# Patient Record
Sex: Male | Born: 1940
Health system: Southern US, Community
[De-identification: ages and names within clinical notes are randomized; demographics above are authoritative.]

## PROBLEM LIST (undated history)

## (undated) DIAGNOSIS — G8929 Other chronic pain: Secondary | ICD-10-CM

## (undated) DIAGNOSIS — M1990 Unspecified osteoarthritis, unspecified site: Secondary | ICD-10-CM

## (undated) DIAGNOSIS — D649 Anemia, unspecified: Secondary | ICD-10-CM

## (undated) DIAGNOSIS — R0609 Other forms of dyspnea: Secondary | ICD-10-CM

## (undated) DIAGNOSIS — E785 Hyperlipidemia, unspecified: Secondary | ICD-10-CM

## (undated) DIAGNOSIS — F1120 Opioid dependence, uncomplicated: Secondary | ICD-10-CM

## (undated) DIAGNOSIS — Z9889 Other specified postprocedural states: Secondary | ICD-10-CM

## (undated) DIAGNOSIS — Z87442 Personal history of urinary calculi: Secondary | ICD-10-CM

## (undated) DIAGNOSIS — I119 Hypertensive heart disease without heart failure: Secondary | ICD-10-CM

## (undated) DIAGNOSIS — E038 Other specified hypothyroidism: Secondary | ICD-10-CM

## (undated) DIAGNOSIS — R001 Bradycardia, unspecified: Secondary | ICD-10-CM

## (undated) DIAGNOSIS — H04123 Dry eye syndrome of bilateral lacrimal glands: Secondary | ICD-10-CM

## (undated) DIAGNOSIS — C9590 Leukemia, unspecified not having achieved remission: Secondary | ICD-10-CM

## (undated) DIAGNOSIS — F419 Anxiety disorder, unspecified: Secondary | ICD-10-CM

## (undated) DIAGNOSIS — I2510 Atherosclerotic heart disease of native coronary artery without angina pectoris: Secondary | ICD-10-CM

## (undated) DIAGNOSIS — C9110 Chronic lymphocytic leukemia of B-cell type not having achieved remission: Secondary | ICD-10-CM

## (undated) DIAGNOSIS — T8090XA Unspecified complication following infusion and therapeutic injection, initial encounter: Secondary | ICD-10-CM

## (undated) DIAGNOSIS — K5900 Constipation, unspecified: Secondary | ICD-10-CM

## (undated) DIAGNOSIS — D6959 Other secondary thrombocytopenia: Secondary | ICD-10-CM

## (undated) DIAGNOSIS — F320 Major depressive disorder, single episode, mild: Secondary | ICD-10-CM

## (undated) DIAGNOSIS — M549 Dorsalgia, unspecified: Secondary | ICD-10-CM

## (undated) DIAGNOSIS — R079 Chest pain, unspecified: Secondary | ICD-10-CM

## (undated) DIAGNOSIS — M792 Neuralgia and neuritis, unspecified: Secondary | ICD-10-CM

## (undated) DIAGNOSIS — N200 Calculus of kidney: Secondary | ICD-10-CM

## (undated) DIAGNOSIS — N400 Enlarged prostate without lower urinary tract symptoms: Secondary | ICD-10-CM

## (undated) DIAGNOSIS — R351 Nocturia: Secondary | ICD-10-CM

## (undated) DIAGNOSIS — R531 Weakness: Secondary | ICD-10-CM

## (undated) DIAGNOSIS — G894 Chronic pain syndrome: Secondary | ICD-10-CM

## (undated) DIAGNOSIS — M5416 Radiculopathy, lumbar region: Secondary | ICD-10-CM

## (undated) DIAGNOSIS — Z9289 Personal history of other medical treatment: Secondary | ICD-10-CM

## (undated) DIAGNOSIS — A088 Other specified intestinal infections: Secondary | ICD-10-CM

## (undated) DIAGNOSIS — G4700 Insomnia, unspecified: Secondary | ICD-10-CM

## (undated) DIAGNOSIS — D693 Immune thrombocytopenic purpura: Secondary | ICD-10-CM

## (undated) DIAGNOSIS — M62838 Other muscle spasm: Secondary | ICD-10-CM

## (undated) DIAGNOSIS — I10 Essential (primary) hypertension: Secondary | ICD-10-CM

## (undated) DIAGNOSIS — E538 Deficiency of other specified B group vitamins: Secondary | ICD-10-CM

## (undated) DIAGNOSIS — M545 Low back pain: Secondary | ICD-10-CM

## (undated) DIAGNOSIS — D6949 Other primary thrombocytopenia: Secondary | ICD-10-CM

## (undated) HISTORY — DX: Opioid dependence, uncomplicated: F11.20

## (undated) HISTORY — PX: FOOT SURGERY: SHX648

## (undated) HISTORY — DX: Radiculopathy, lumbar region: M54.16

## (undated) HISTORY — DX: Chronic lymphocytic leukemia of B-cell type not having achieved remission: C91.10

## (undated) HISTORY — DX: Other specified postprocedural states: Z98.890

## (undated) HISTORY — PX: COLONOSCOPY: SHX174

## (undated) HISTORY — DX: Essential (primary) hypertension: I10

## (undated) HISTORY — DX: Low back pain: M54.5

## (undated) HISTORY — PX: KNEE SURGERY: SHX244

## (undated) HISTORY — DX: Calculus of kidney: N20.0

## (undated) HISTORY — PX: LAMINECTOMY: SHX219

## (undated) HISTORY — DX: Other forms of dyspnea: R06.09

## (undated) HISTORY — PX: TOE SURGERY: SHX1073

## (undated) HISTORY — DX: Benign prostatic hyperplasia without lower urinary tract symptoms: N40.0

## (undated) HISTORY — DX: Other specified hypothyroidism: E03.8

## (undated) HISTORY — DX: Bradycardia, unspecified: R00.1

## (undated) HISTORY — DX: Hypertensive heart disease without heart failure: I11.9

## (undated) HISTORY — PX: HERNIA REPAIR: SHX51

## (undated) HISTORY — DX: Immune thrombocytopenic purpura: D69.3

## (undated) HISTORY — DX: Chronic pain syndrome: G89.4

## (undated) HISTORY — DX: Other secondary thrombocytopenia: D69.59

## (undated) HISTORY — DX: Hyperlipidemia, unspecified: E78.5

## (undated) HISTORY — DX: Major depressive disorder, single episode, mild: F32.0

## (undated) HISTORY — DX: Chest pain, unspecified: R07.9

## (undated) HISTORY — PX: TRANSURETHRAL RESECTION OF PROSTATE: SHX73

## (undated) HISTORY — DX: Other primary thrombocytopenia: D69.49

## (undated) HISTORY — DX: Atherosclerotic heart disease of native coronary artery without angina pectoris: I25.10

## (undated) HISTORY — DX: Other specified intestinal infections: A08.8

## (undated) HISTORY — PX: CATARACT EXTRACTION: SUR2

---

## 1986-09-19 HISTORY — PX: CERVICAL FUSION: SHX112

## 2005-10-17 ENCOUNTER — Ambulatory Visit: Payer: Self-pay | Admitting: Oncology

## 2007-04-26 ENCOUNTER — Encounter: Admission: RE | Admit: 2007-04-26 | Discharge: 2007-04-26 | Payer: Self-pay | Admitting: Orthopedic Surgery

## 2007-05-08 ENCOUNTER — Encounter: Admission: RE | Admit: 2007-05-08 | Discharge: 2007-05-08 | Payer: Self-pay | Admitting: Orthopedic Surgery

## 2008-08-01 ENCOUNTER — Encounter: Payer: Self-pay | Admitting: Unknown Physician Specialty

## 2010-09-19 HISTORY — PX: BACK SURGERY: SHX140
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
## 2012-08-14 ENCOUNTER — Other Ambulatory Visit (HOSPITAL_COMMUNITY)
Admission: RE | Admit: 2012-08-14 | Discharge: 2012-08-14 | Disposition: A | Discharge status: Home / Self Care | Payer: Medicare Other | Source: Ambulatory Visit | Attending: Oncology | Admitting: Oncology

## 2012-08-14 DIAGNOSIS — D469 Myelodysplastic syndrome, unspecified: Secondary | ICD-10-CM | POA: Insufficient documentation

## 2012-12-12 DIAGNOSIS — M545 Low back pain, unspecified: Secondary | ICD-10-CM

## 2012-12-12 HISTORY — DX: Low back pain, unspecified: M54.50

## 2013-01-09 DIAGNOSIS — M5416 Radiculopathy, lumbar region: Secondary | ICD-10-CM

## 2013-01-09 HISTORY — DX: Radiculopathy, lumbar region: M54.16

## 2013-07-02 ENCOUNTER — Other Ambulatory Visit: Payer: Self-pay | Admitting: Neurosurgery

## 2013-07-02 DIAGNOSIS — M549 Dorsalgia, unspecified: Secondary | ICD-10-CM

## 2013-08-08 DIAGNOSIS — D693 Immune thrombocytopenic purpura: Secondary | ICD-10-CM

## 2013-08-08 DIAGNOSIS — C9110 Chronic lymphocytic leukemia of B-cell type not having achieved remission: Secondary | ICD-10-CM | POA: Insufficient documentation

## 2013-08-08 HISTORY — DX: Immune thrombocytopenic purpura: D69.3

## 2013-08-09 ENCOUNTER — Other Ambulatory Visit: Payer: Medicare Other

## 2013-08-09 ENCOUNTER — Inpatient Hospital Stay
Admission: RE | Admit: 2013-08-09 | Discharge: 2013-08-09 | Disposition: A | Discharge status: Home / Self Care | Payer: Medicare Other | Source: Ambulatory Visit | Attending: Neurosurgery | Admitting: Neurosurgery

## 2013-09-19 HISTORY — PX: OTHER SURGICAL HISTORY: SHX169

## 2014-10-08 LAB — HM COLONOSCOPY

## 2015-08-18 ENCOUNTER — Other Ambulatory Visit: Payer: Self-pay | Admitting: Neurological Surgery

## 2015-08-18 DIAGNOSIS — M545 Low back pain: Secondary | ICD-10-CM

## 2015-08-20 ENCOUNTER — Ambulatory Visit
Admission: RE | Admit: 2015-08-20 | Discharge: 2015-08-20 | Disposition: A | Discharge status: Home / Self Care | Payer: Medicare Other | Source: Ambulatory Visit | Attending: Neurological Surgery | Admitting: Neurological Surgery

## 2015-08-20 DIAGNOSIS — M545 Low back pain: Secondary | ICD-10-CM

## 2015-11-10 ENCOUNTER — Other Ambulatory Visit: Payer: Self-pay | Admitting: Neurological Surgery

## 2015-11-25 ENCOUNTER — Encounter (HOSPITAL_COMMUNITY)
Admission: RE | Admit: 2015-11-25 | Discharge: 2015-11-25 | Disposition: A | Discharge status: Home / Self Care | Payer: Medicare Other | Source: Ambulatory Visit | Attending: Neurological Surgery | Admitting: Neurological Surgery

## 2015-11-25 ENCOUNTER — Ambulatory Visit (HOSPITAL_COMMUNITY)
Admission: RE | Admit: 2015-11-25 | Discharge: 2015-11-25 | Disposition: A | Discharge status: Home / Self Care | Payer: Medicare Other | Source: Ambulatory Visit | Attending: Neurological Surgery | Admitting: Neurological Surgery

## 2015-11-25 ENCOUNTER — Encounter (HOSPITAL_COMMUNITY): Payer: Self-pay

## 2015-11-25 DIAGNOSIS — Z01818 Encounter for other preprocedural examination: Secondary | ICD-10-CM | POA: Diagnosis present

## 2015-11-25 DIAGNOSIS — D649 Anemia, unspecified: Secondary | ICD-10-CM | POA: Insufficient documentation

## 2015-11-25 DIAGNOSIS — C9110 Chronic lymphocytic leukemia of B-cell type not having achieved remission: Secondary | ICD-10-CM | POA: Insufficient documentation

## 2015-11-25 DIAGNOSIS — S32009K Unspecified fracture of unspecified lumbar vertebra, subsequent encounter for fracture with nonunion: Secondary | ICD-10-CM | POA: Diagnosis not present

## 2015-11-25 DIAGNOSIS — E538 Deficiency of other specified B group vitamins: Secondary | ICD-10-CM | POA: Diagnosis not present

## 2015-11-25 DIAGNOSIS — D693 Immune thrombocytopenic purpura: Secondary | ICD-10-CM | POA: Insufficient documentation

## 2015-11-25 DIAGNOSIS — X58XXXD Exposure to other specified factors, subsequent encounter: Secondary | ICD-10-CM | POA: Diagnosis not present

## 2015-11-25 DIAGNOSIS — E785 Hyperlipidemia, unspecified: Secondary | ICD-10-CM | POA: Diagnosis not present

## 2015-11-25 HISTORY — DX: Unspecified complication following infusion and therapeutic injection, initial encounter: T80.90XA

## 2015-11-25 HISTORY — DX: Other chronic pain: G89.29

## 2015-11-25 HISTORY — DX: Dry eye syndrome of bilateral lacrimal glands: H04.123

## 2015-11-25 HISTORY — DX: Leukemia, unspecified not having achieved remission: C95.90

## 2015-11-25 HISTORY — DX: Personal history of urinary calculi: Z87.442

## 2015-11-25 HISTORY — DX: Nocturia: R35.1

## 2015-11-25 HISTORY — DX: Other muscle spasm: M62.838

## 2015-11-25 HISTORY — DX: Hyperlipidemia, unspecified: E78.5

## 2015-11-25 HISTORY — DX: Unspecified osteoarthritis, unspecified site: M19.90

## 2015-11-25 HISTORY — DX: Constipation, unspecified: K59.00

## 2015-11-25 HISTORY — DX: Anemia, unspecified: D64.9

## 2015-11-25 HISTORY — DX: Weakness: R53.1

## 2015-11-25 HISTORY — DX: Neuralgia and neuritis, unspecified: M79.2

## 2015-11-25 HISTORY — DX: Dorsalgia, unspecified: M54.9

## 2015-11-25 HISTORY — DX: Insomnia, unspecified: G47.00

## 2015-11-25 HISTORY — DX: Anxiety disorder, unspecified: F41.9

## 2015-11-25 HISTORY — DX: Deficiency of other specified B group vitamins: E53.8

## 2015-11-25 HISTORY — DX: Personal history of other medical treatment: Z92.89

## 2015-11-25 LAB — BASIC METABOLIC PANEL
ANION GAP: 8 (ref 5–15)
BUN: 13 mg/dL (ref 6–20)
CO2: 27 mmol/L (ref 22–32)
Calcium: 9.3 mg/dL (ref 8.9–10.3)
Chloride: 106 mmol/L (ref 101–111)
Creatinine, Ser: 1.02 mg/dL (ref 0.61–1.24)
Glucose, Bld: 97 mg/dL (ref 65–99)
Potassium: 4.6 mmol/L (ref 3.5–5.1)
Sodium: 141 mmol/L (ref 135–145)

## 2015-11-25 LAB — TYPE AND SCREEN
ABO/RH(D): O POS
Antibody Screen: NEGATIVE

## 2015-11-25 LAB — CBC WITH DIFFERENTIAL/PLATELET
BASOS ABS: 0 10*3/uL (ref 0.0–0.1)
Basophils Relative: 0 %
EOS PCT: 3 %
Eosinophils Absolute: 0.2 10*3/uL (ref 0.0–0.7)
HEMATOCRIT: 41.2 % (ref 39.0–52.0)
Hemoglobin: 14 g/dL (ref 13.0–17.0)
LYMPHS ABS: 1 10*3/uL (ref 0.7–4.0)
LYMPHS PCT: 17 %
MCH: 30.2 pg (ref 26.0–34.0)
MCHC: 34 g/dL (ref 30.0–36.0)
MCV: 88.8 fL (ref 78.0–100.0)
MONO ABS: 0.4 10*3/uL (ref 0.1–1.0)
MONOS PCT: 7 %
NEUTROS ABS: 4.3 10*3/uL (ref 1.7–7.7)
Neutrophils Relative %: 73 %
Platelets: 265 10*3/uL (ref 150–400)
RBC: 4.64 MIL/uL (ref 4.22–5.81)
RDW: 13.5 % (ref 11.5–15.5)
WBC: 5.9 10*3/uL (ref 4.0–10.5)

## 2015-11-25 LAB — SURGICAL PCR SCREEN
MRSA, PCR: NEGATIVE
STAPHYLOCOCCUS AUREUS: NEGATIVE

## 2015-11-25 LAB — ABO/RH: ABO/RH(D): O POS

## 2015-11-25 LAB — PROTIME-INR
INR: 1.16 (ref 0.00–1.49)
Prothrombin Time: 15 seconds (ref 11.6–15.2)

## 2015-11-25 NOTE — Progress Notes (Addendum)
Cardiologist is Dr.[facility] visit under care everywhere from 07-02-15  Medical Md [facility]   Multiple echo reports in epic with most recent 07-10-15  Stress test requested from [facility] in [place] cath  EKG req from [facility] in [place] denies in past yr

## 2015-11-25 NOTE — Pre-Procedure Instructions (Signed)
Jesse [name]  11/25/2015      [facility] - [place], [place] - [address] [address] Phone: [phone] Fax: [phone]    Your procedure is scheduled on Wed,Mar 15 @ 8:30 AM  Report to [facility] Admitting at 6:30 AM  Call this number if you have problems the morning of surgery:  [phone]   Remember:  Do not eat food or drink liquids after midnight.  Take these medicines the morning of surgery with A SIP OF WATER Alprazolam(Xanax),Gabapentin(Neurontin),Pain Pill(if needed),and Eye Drops.              Stop taking your Ibuprofen along with any Vitamins or Herbal Medications. No Goody's,BC's,Aleve,Advil,Motrin,or Fish Oil.    Do not wear jewelry.  Do not wear lotions, powders, or colognes.  You may wear deodorant.  Men may shave face and neck.  Do not bring valuables to the hospital.  [facility] is not responsible for any belongings or valuables.  Contacts, dentures or bridgework may not be worn into surgery.  Leave your suitcase in the car.  After surgery it may be brought to your room.  For patients admitted to the hospital, discharge time will be determined by your treatment team.  Patients discharged the day of surgery will not be allowed to drive home.    Special instructions:  [facility] - Preparing for Surgery  Before surgery, you can play an important role.  Because skin is not sterile, your skin needs to be as free of germs as possible.  You can reduce the number of germs on you skin by washing with CHG (chlorahexidine gluconate) soap before surgery.  CHG is an antiseptic cleaner which kills germs and bonds with the skin to continue killing germs even after washing.  Please DO NOT use if you have an allergy to CHG or antibacterial soaps.  If your skin becomes reddened/irritated stop using the CHG and inform your nurse when you arrive at Short Stay.  Do not shave (including legs and underarms) for at least  48 hours prior to the first CHG shower.  You may shave your face.  Please follow these instructions carefully:   1.  Shower with CHG Soap the night before surgery and the                                morning of Surgery.  2.  If you choose to wash your hair, wash your hair first as usual with your       normal shampoo.  3.  After you shampoo, rinse your hair and body thoroughly to remove the                      Shampoo.  4.  Use CHG as you would any other liquid soap.  You can apply chg directly       to the skin and wash gently with scrungie or a clean washcloth.  5.  Apply the CHG Soap to your body ONLY FROM THE NECK DOWN.        Do not use on open wounds or open sores.  Avoid contact with your eyes,       ears, mouth and genitals (private parts).  Wash genitals (private parts)       with your normal soap.  6.  Wash thoroughly, paying special attention  to the area where your surgery        will be performed.  7.  Thoroughly rinse your body with warm water from the neck down.  8.  DO NOT shower/wash with your normal soap after using and rinsing off       the CHG Soap.  9.  Pat yourself dry with a clean towel.            10.  Wear clean pajamas.            11.  Place clean sheets on your bed the night of your first shower and do not        sleep with pets.  Day of Surgery  Do not apply any lotions/deoderants the morning of surgery.  Please wear clean clothes to the hospital/surgery center.    Please read over the following fact sheets that you were given. Pain Booklet, Coughing and Deep Breathing, Blood Transfusion Information, MRSA Information and Surgical Site Infection Prevention

## 2015-11-26 NOTE — Progress Notes (Signed)
Anesthesia Chart Review:  Pt is a 75 year old male scheduled for L3-4 anterior lasteral lumbar fusion on 12/02/2015 with Dr. Ronnald Ramp.   Cardiologist is Dr. Danie Binder (care everywhere), who cleared pt for surgery. Hem-onc is Dr. Phill Myron (care everywhere), who cleared pt for surgery. PCP is Dr. Rochel Brome.   PMH includes:  Hyperlipidemia, chronic lymphocytic leukemia, chronic ITP,  anemia, vitamin B12 deficiency. Never smoker. BMI 27.   Medications include: iron, pravastatin, vitamin B12.   Preoperative labs reviewed.  CBC is WNL.   Chest x-ray 11/25/15 reviewed. No acute cardiopulmonary disease.   EKG 07/02/15: sinus bradycardia (56 bpm), possible LVH  Echo 07/10/15:  1. Mild concentric LVH with normal contraction, EF 123456, grade I diastolic dysfunction 2. Trace MR, moderate LA dilatation 3. Aortic sclerosis 4. Normal right heart size/function, mild TR, normal pulm artery pressure  If no changes, I anticipate pt can proceed with surgery as scheduled.   Willeen Cass, FNP-BC [facility] Short Stay Surgical Center/Anesthesiology Phone: [phone] 11/26/2015 4:10 PM

## 2015-11-27 ENCOUNTER — Encounter (HOSPITAL_COMMUNITY): Payer: Self-pay

## 2015-12-01 MED ORDER — DEXAMETHASONE SODIUM PHOSPHATE 10 MG/ML IJ SOLN
10.0000 mg | INTRAMUSCULAR | Status: AC
  Administered 2015-12-02: 10 mg via INTRAVENOUS
  Filled 2015-12-01: qty 1

## 2015-12-01 MED ORDER — CEFAZOLIN SODIUM-DEXTROSE 2-3 GM-% IV SOLR
2.0000 g | INTRAVENOUS | Status: AC
  Administered 2015-12-02: 2 g via INTRAVENOUS

## 2015-12-01 NOTE — Anesthesia Preprocedure Evaluation (Signed)
Anesthesia Evaluation  Patient identified by MRN, date of birth, ID band Patient awake    Reviewed: Allergy & Precautions, H&P , Patient's Chart, lab work & pertinent test results, reviewed documented beta blocker date and time   Airway Mallampati: II  TM Distance: >3 FB Neck ROM: full    Dental no notable dental hx.    Pulmonary    Pulmonary exam normal breath sounds clear to auscultation       Cardiovascular  Rhythm:regular Rate:Normal     Neuro/Psych    GI/Hepatic   Endo/Other    Renal/GU      Musculoskeletal   Abdominal   Peds  Hematology   Anesthesia Other Findings   Reproductive/Obstetrics                             Anesthesia Physical Anesthesia Plan  ASA: II  Anesthesia Plan: General   Post-op Pain Management:    Induction: Intravenous  Airway Management Planned: Oral ETT  Additional Equipment:   Intra-op Plan:   Post-operative Plan: Extubation in OR  Informed Consent: I have reviewed the patients History and Physical, chart, labs and discussed the procedure including the risks, benefits and alternatives for the proposed anesthesia with the patient or authorized representative who has indicated his/her understanding and acceptance.   Dental Advisory Given and Dental advisory given  Plan Discussed with: CRNA and Surgeon  Anesthesia Plan Comments: (  Discussed general anesthesia, including possible nausea, instrumentation of airway, sore throat,pulmonary aspiration, etc. I asked if the were any outstanding questions, or  concerns before we proceeded.)        Anesthesia Quick Evaluation  

## 2015-12-02 ENCOUNTER — Inpatient Hospital Stay (HOSPITAL_COMMUNITY): Payer: Medicare Other | Admitting: Anesthesiology

## 2015-12-02 ENCOUNTER — Encounter (HOSPITAL_COMMUNITY): Payer: Self-pay | Admitting: Neurological Surgery

## 2015-12-02 ENCOUNTER — Encounter (HOSPITAL_COMMUNITY)
Admission: RE | Disposition: A | Discharge status: Home / Self Care | Payer: Self-pay | Source: Ambulatory Visit | Attending: Neurological Surgery

## 2015-12-02 ENCOUNTER — Inpatient Hospital Stay (HOSPITAL_COMMUNITY): Payer: Medicare Other | Admitting: Emergency Medicine

## 2015-12-02 ENCOUNTER — Inpatient Hospital Stay (HOSPITAL_COMMUNITY): Payer: Medicare Other

## 2015-12-02 ENCOUNTER — Inpatient Hospital Stay (HOSPITAL_COMMUNITY)
Admission: RE | Admit: 2015-12-02 | Discharge: 2015-12-03 | DRG: 460 | Disposition: A | Discharge status: Home / Self Care | Payer: Medicare Other | Source: Ambulatory Visit | Attending: Neurological Surgery | Admitting: Neurological Surgery

## 2015-12-02 DIAGNOSIS — E538 Deficiency of other specified B group vitamins: Secondary | ICD-10-CM | POA: Diagnosis present

## 2015-12-02 DIAGNOSIS — G4700 Insomnia, unspecified: Secondary | ICD-10-CM | POA: Diagnosis present

## 2015-12-02 DIAGNOSIS — Z9889 Other specified postprocedural states: Secondary | ICD-10-CM

## 2015-12-02 DIAGNOSIS — M960 Pseudarthrosis after fusion or arthrodesis: Secondary | ICD-10-CM | POA: Diagnosis present

## 2015-12-02 DIAGNOSIS — Y793 Surgical instruments, materials and orthopedic devices (including sutures) associated with adverse incidents: Secondary | ICD-10-CM | POA: Diagnosis present

## 2015-12-02 DIAGNOSIS — Y838 Other surgical procedures as the cause of abnormal reaction of the patient, or of later complication, without mention of misadventure at the time of the procedure: Secondary | ICD-10-CM | POA: Diagnosis present

## 2015-12-02 DIAGNOSIS — M549 Dorsalgia, unspecified: Secondary | ICD-10-CM | POA: Diagnosis present

## 2015-12-02 DIAGNOSIS — Z981 Arthrodesis status: Secondary | ICD-10-CM | POA: Diagnosis not present

## 2015-12-02 DIAGNOSIS — E785 Hyperlipidemia, unspecified: Secondary | ICD-10-CM | POA: Diagnosis present

## 2015-12-02 DIAGNOSIS — F419 Anxiety disorder, unspecified: Secondary | ICD-10-CM | POA: Diagnosis present

## 2015-12-02 HISTORY — DX: Other specified postprocedural states: Z98.890

## 2015-12-02 HISTORY — PX: ANTERIOR LAT LUMBAR FUSION: SHX1168

## 2015-12-02 SURGERY — ANTERIOR LATERAL LUMBAR FUSION 1 LEVEL
Anesthesia: General | Site: Flank

## 2015-12-02 MED ORDER — GLYCOPYRROLATE 0.2 MG/ML IJ SOLN
INTRAMUSCULAR | Status: DC | PRN
  Administered 2015-12-02: 0.2 mg via INTRAVENOUS

## 2015-12-02 MED ORDER — THROMBIN 5000 UNITS EX SOLR
OROMUCOSAL | Status: DC | PRN
  Administered 2015-12-02: 10 mL via TOPICAL

## 2015-12-02 MED ORDER — LIDOCAINE HCL 4 % EX SOLN
CUTANEOUS | Status: DC | PRN
  Administered 2015-12-02: 4 mL via TOPICAL

## 2015-12-02 MED ORDER — CELECOXIB 200 MG PO CAPS
200.0000 mg | ORAL_CAPSULE | Freq: Two times a day (BID) | ORAL | Status: DC
  Administered 2015-12-02 – 2015-12-03 (×3): 200 mg via ORAL
  Filled 2015-12-02 (×3): qty 1

## 2015-12-02 MED ORDER — ACETAMINOPHEN 650 MG RE SUPP
650.0000 mg | RECTAL | Status: DC | PRN
Start: 2015-12-02 — End: 2015-12-03

## 2015-12-02 MED ORDER — ACETAMINOPHEN 325 MG PO TABS: 650.0000 mg | ORAL_TABLET | ORAL | Status: DC | PRN

## 2015-12-02 MED ORDER — OXYCODONE-ACETAMINOPHEN 5-325 MG PO TABS
1.0000 | ORAL_TABLET | ORAL | Status: DC | PRN
  Administered 2015-12-02 – 2015-12-03 (×2): 1 via ORAL
  Administered 2015-12-03 (×2): 2 via ORAL
  Filled 2015-12-02 (×2): qty 2
  Filled 2015-12-02: qty 1

## 2015-12-02 MED ORDER — ALPRAZOLAM 0.5 MG PO TABS
0.5000 mg | ORAL_TABLET | Freq: Three times a day (TID) | ORAL | Status: DC
  Administered 2015-12-02 – 2015-12-03 (×4): 0.5 mg via ORAL
  Filled 2015-12-02 (×3): qty 1

## 2015-12-02 MED ORDER — EPHEDRINE SULFATE 50 MG/ML IJ SOLN
INTRAMUSCULAR | Status: DC | PRN
  Administered 2015-12-02: 10 mg via INTRAVENOUS
  Administered 2015-12-02 (×2): 20 mg via INTRAVENOUS

## 2015-12-02 MED ORDER — LIDOCAINE HCL (CARDIAC) 20 MG/ML IV SOLN
INTRAVENOUS | Status: DC | PRN
  Administered 2015-12-02: 50 mg via INTRATRACHEAL

## 2015-12-02 MED ORDER — METHOCARBAMOL 500 MG PO TABS
750.0000 mg | ORAL_TABLET | Freq: Four times a day (QID) | ORAL | Status: DC | PRN
  Administered 2015-12-02 – 2015-12-03 (×3): 750 mg via ORAL
  Filled 2015-12-02 (×3): qty 2

## 2015-12-02 MED ORDER — MENTHOL 3 MG MT LOZG
1.0000 | LOZENGE | OROMUCOSAL | Status: DC | PRN
  Filled 2015-12-02: qty 9

## 2015-12-02 MED ORDER — SODIUM CHLORIDE 0.9% FLUSH: 3.0000 mL | INTRAVENOUS | Status: DC | PRN

## 2015-12-02 MED ORDER — FENTANYL CITRATE (PF) 250 MCG/5ML IJ SOLN
INTRAMUSCULAR | Status: DC | PRN
  Administered 2015-12-02: 150 ug via INTRAVENOUS
  Administered 2015-12-02 (×2): 100 ug via INTRAVENOUS

## 2015-12-02 MED ORDER — SUCCINYLCHOLINE CHLORIDE 20 MG/ML IJ SOLN
INTRAMUSCULAR | Status: DC | PRN
  Administered 2015-12-02: 120 mg via INTRAVENOUS

## 2015-12-02 MED ORDER — SODIUM CHLORIDE 0.9 % IV SOLN: 250.0000 mL | INTRAVENOUS | Status: DC

## 2015-12-02 MED ORDER — OXYCODONE-ACETAMINOPHEN 5-325 MG PO TABS
1.0000 | ORAL_TABLET | Freq: Four times a day (QID) | ORAL | Status: DC | PRN
  Administered 2015-12-02 (×2): 1 via ORAL
  Filled 2015-12-02 (×3): qty 1

## 2015-12-02 MED ORDER — FENTANYL CITRATE (PF) 250 MCG/5ML IJ SOLN
INTRAMUSCULAR | Status: AC
  Filled 2015-12-02: qty 5

## 2015-12-02 MED ORDER — PHENOL 1.4 % MT LIQD
1.0000 | OROMUCOSAL | Status: DC | PRN
Start: 2015-12-02 — End: 2015-12-03

## 2015-12-02 MED ORDER — MORPHINE SULFATE (PF) 2 MG/ML IV SOLN
1.0000 mg | INTRAVENOUS | Status: DC | PRN
  Administered 2015-12-02: 4 mg via INTRAVENOUS
  Filled 2015-12-02: qty 2

## 2015-12-02 MED ORDER — FENTANYL CITRATE (PF) 100 MCG/2ML IJ SOLN: 25.0000 ug | INTRAMUSCULAR | Status: DC | PRN

## 2015-12-02 MED ORDER — ONDANSETRON HCL 4 MG/2ML IJ SOLN
INTRAMUSCULAR | Status: DC | PRN
  Administered 2015-12-02: 4 mg via INTRAVENOUS

## 2015-12-02 MED ORDER — ONDANSETRON HCL 4 MG/2ML IJ SOLN: 4.0000 mg | INTRAMUSCULAR | Status: DC | PRN

## 2015-12-02 MED ORDER — SODIUM CHLORIDE 0.9% FLUSH
3.0000 mL | Freq: Two times a day (BID) | INTRAVENOUS | Status: DC
  Administered 2015-12-02: 3 mL via INTRAVENOUS

## 2015-12-02 MED ORDER — SODIUM CHLORIDE 0.9 % IR SOLN
Status: DC | PRN
  Administered 2015-12-02: 500 mL

## 2015-12-02 MED ORDER — POTASSIUM CHLORIDE IN NACL 20-0.9 MEQ/L-% IV SOLN
INTRAVENOUS | Status: DC
  Filled 2015-12-02 (×4): qty 1000

## 2015-12-02 MED ORDER — 0.9 % SODIUM CHLORIDE (POUR BTL) OPTIME
TOPICAL | Status: DC | PRN
  Administered 2015-12-02: 1000 mL

## 2015-12-02 MED ORDER — PROPOFOL 10 MG/ML IV BOLUS
INTRAVENOUS | Status: AC
  Filled 2015-12-02: qty 20

## 2015-12-02 MED ORDER — LACTATED RINGERS IV SOLN
INTRAVENOUS | Status: DC | PRN
  Administered 2015-12-02: 08:00:00 via INTRAVENOUS

## 2015-12-02 MED ORDER — GABAPENTIN 600 MG PO TABS
600.0000 mg | ORAL_TABLET | Freq: Three times a day (TID) | ORAL | Status: DC
  Administered 2015-12-02 – 2015-12-03 (×3): 600 mg via ORAL
  Filled 2015-12-02 (×3): qty 1

## 2015-12-02 MED ORDER — CEFAZOLIN SODIUM 1-5 GM-% IV SOLN
1.0000 g | Freq: Three times a day (TID) | INTRAVENOUS | Status: AC
Start: 2015-12-02 — End: 2015-12-02
  Administered 2015-12-02 (×2): 1 g via INTRAVENOUS
  Filled 2015-12-02 (×2): qty 50

## 2015-12-02 SURGICAL SUPPLY — 58 items
ADH SKN CLS APL DERMABOND .7 (GAUZE/BANDAGES/DRESSINGS) ×3
ADH SKN CLS LQ APL DERMABOND (GAUZE/BANDAGES/DRESSINGS) ×1
APL SKNCLS STERI-STRIP NONHPOA (GAUZE/BANDAGES/DRESSINGS) ×1
BAG DECANTER FOR FLEXI CONT (MISCELLANEOUS) ×2 IMPLANT
BENZOIN TINCTURE PRP APPL 2/3 (GAUZE/BANDAGES/DRESSINGS) ×2 IMPLANT
BLADE CLIPPER SURG (BLADE) IMPLANT
BOLT 5.0X50 SPINAL (Bolt) ×1 IMPLANT
BOLT PLATE XLIF 5.0X55 ST (Bolt) ×1 IMPLANT
BOLT PLATE XLIF 5.5X55 LRG (Bolt) ×1 IMPLANT
BONE MATRIX OSTEOCEL PRO MED (Bone Implant) ×1 IMPLANT
COROENT XL-W 10X22X50 (Orthopedic Implant) ×1 IMPLANT
COVER BACK TABLE 24X17X13 BIG (DRAPES) IMPLANT
DERMABOND ADHESIVE PROPEN (GAUZE/BANDAGES/DRESSINGS) ×1
DERMABOND ADVANCED (GAUZE/BANDAGES/DRESSINGS) ×3
DERMABOND ADVANCED .7 DNX12 (GAUZE/BANDAGES/DRESSINGS) ×2 IMPLANT
DERMABOND ADVANCED .7 DNX6 (GAUZE/BANDAGES/DRESSINGS) IMPLANT
DRAPE C-ARM 42X72 X-RAY (DRAPES) ×2 IMPLANT
DRAPE C-ARMOR (DRAPES) ×2 IMPLANT
DRAPE LAPAROTOMY 100X72X124 (DRAPES) ×2 IMPLANT
DRAPE POUCH INSTRU U-SHP 10X18 (DRAPES) ×2 IMPLANT
DRAPE SURG 17X23 STRL (DRAPES) ×8 IMPLANT
DRSG OPSITE POSTOP 4X6 (GAUZE/BANDAGES/DRESSINGS) ×2 IMPLANT
DURAPREP 26ML APPLICATOR (WOUND CARE) ×2 IMPLANT
ELECT REM PT RETURN 9FT ADLT (ELECTROSURGICAL) ×2
ELECTRODE REM PT RTRN 9FT ADLT (ELECTROSURGICAL) ×1 IMPLANT
GAUZE SPONGE 4X4 16PLY XRAY LF (GAUZE/BANDAGES/DRESSINGS) IMPLANT
GLOVE BIO SURGEON STRL SZ8 (GLOVE) ×2 IMPLANT
GOWN STRL REUS W/ TWL LRG LVL3 (GOWN DISPOSABLE) IMPLANT
GOWN STRL REUS W/ TWL XL LVL3 (GOWN DISPOSABLE) IMPLANT
GOWN STRL REUS W/TWL 2XL LVL3 (GOWN DISPOSABLE) IMPLANT
GOWN STRL REUS W/TWL LRG LVL3 (GOWN DISPOSABLE)
GOWN STRL REUS W/TWL XL LVL3 (GOWN DISPOSABLE)
HEMOSTAT POWDER KIT SURGIFOAM (HEMOSTASIS) ×1 IMPLANT
KIT BASIN OR (CUSTOM PROCEDURE TRAY) ×2 IMPLANT
KIT DILATOR XLIF 5 (KITS) IMPLANT
KIT ROOM TURNOVER OR (KITS) ×2 IMPLANT
KIT SURGICAL ACCESS MAXCESS 4 (KITS) ×1 IMPLANT
KIT XLIF (KITS) ×1
MODULE NVM5 NEXT GEN EMG (NEEDLE) ×1 IMPLANT
NDL HYPO 25X1 1.5 SAFETY (NEEDLE) ×1 IMPLANT
NEEDLE HYPO 25X1 1.5 SAFETY (NEEDLE) ×2 IMPLANT
NS IRRIG 1000ML POUR BTL (IV SOLUTION) ×2 IMPLANT
PACK LAMINECTOMY NEURO (CUSTOM PROCEDURE TRAY) ×2 IMPLANT
PLATE 4H DECADE SPINAL (Plate) ×1 IMPLANT
SCREW DECADE 5.5X50 (Screw) ×1 IMPLANT
SPONGE LAP 4X18 X RAY DECT (DISPOSABLE) IMPLANT
STRIP CLOSURE SKIN 1/2X4 (GAUZE/BANDAGES/DRESSINGS) ×2 IMPLANT
SUT VIC AB 0 CT1 18XCR BRD8 (SUTURE) ×1 IMPLANT
SUT VIC AB 0 CT1 8-18 (SUTURE) ×2
SUT VIC AB 2-0 CP2 18 (SUTURE) ×2 IMPLANT
SUT VIC AB 3-0 SH 8-18 (SUTURE) ×2 IMPLANT
SWABSTICK BENZOIN STERILE (MISCELLANEOUS) ×2 IMPLANT
TAPE CLOTH 3X10 TAN LF (GAUZE/BANDAGES/DRESSINGS) ×6 IMPLANT
TAPE STRIPS DRAPE STRL (GAUZE/BANDAGES/DRESSINGS) ×1 IMPLANT
TOWEL OR 17X24 6PK STRL BLUE (TOWEL DISPOSABLE) ×2 IMPLANT
TOWEL OR 17X26 10 PK STRL BLUE (TOWEL DISPOSABLE) ×2 IMPLANT
TRAY FOLEY W/METER SILVER 14FR (SET/KITS/TRAYS/PACK) ×2 IMPLANT
WATER STERILE IRR 1000ML POUR (IV SOLUTION) ×2 IMPLANT

## 2015-12-02 NOTE — Transfer of Care (Signed)
Immediate Anesthesia Transfer of Care Note  Patient: Jesse [name]  Procedure(s) Performed: Procedure(s) with comments: L3-4 ANTERIOR LATERAL LUMBAR FUSION w/lateral plate (N/A) - [id number] ANTERIOR LATERAL LUMBAR FUSION w/lateral plate  Patient Location: PACU  Anesthesia Type:General  Level of Consciousness: awake, alert  and oriented  Airway & Oxygen Therapy: Patient Spontanous Breathing and Patient connected to nasal cannula oxygen  Post-op Assessment: Report given to RN, Post -op Vital signs reviewed and stable and Patient moving all extremities X 4  Post vital signs: Reviewed and stable  Last Vitals:  Filed Vitals:   12/02/15 1047 12/02/15 1049  BP:  141/79  Pulse:  97  Temp: 36.3 C   Resp:  36    Complications: No apparent anesthesia complications

## 2015-12-02 NOTE — Progress Notes (Signed)
Pt arrived into pacu with airway in place, encourgaed by CRNA to breathe, twitching to both hands notedm Dr Ronnald Ramp aware. Airway out approx 5 minutes later, pt alert and following commands

## 2015-12-02 NOTE — H&P (Signed)
Subjective: Patient is a 75 y.o. male admitted for XLIF. Onset of symptoms was several months ago, gradually worsening since that time.  The pain is rated intense, and is located at the across the lower back and radiates to RLE. The pain is described as aching and occurs all day. The symptoms have been progressive. Symptoms are exacerbated by exercise. MRI or CT showed pseudoarthrosis L3-4 with spondy. Previous Thoracolumbar fusion elsewhere, followed by hardware extraction. Pain began after hardware extraction.   Past Medical History  Diagnosis Date  . Anxiety     takes Xanax daily  . Hyperlipidemia     takes Pravastatin daily  . Constipation     takes Colace daily as needed  . Muscle spasm     takes Robaxin daily as needed  . Insomnia     takes trazodone nightly as needed  . Anemia     takes Ferrous Sulfate daily  . Vitamin B12 deficiency     takes Vit B12 daily  . Leukemia ([place])   . Weakness     numbness and tingling  . Nerve pain     takes Gabapentin daily   . Chronic back pain   . Arthritis   . History of kidney stones   . Nocturia   . History of blood transfusion     no abnormal reaction noted  . Infusion reaction 4 yrs ago    platelet infusion broke out head to toe  . Dry eyes     eye drops as needed    Past Surgical History  Procedure Laterality Date  . Cervical fusion  1988    x 2  . Back surgery  2012    fusion x 2  . Plates/screws/rods removed  2015  . Hernia repair Left     inguinal  . Colonoscopy    . Transurethral resection of prostate      Prior to Admission medications   Medication Sig Start Date End Date Taking? Authorizing Provider  ALPRAZolam Duanne Moron) 0.5 MG tablet Take 0.5 mg by mouth 3 (three) times daily.   Yes Historical Provider, MD  ferrous sulfate 325 (65 FE) MG tablet Take 325 mg by mouth daily with breakfast.   Yes Historical Provider, MD  gabapentin (NEURONTIN) 600 MG tablet Take 600 mg by mouth 3 (three) times daily.   Yes Historical  Provider, MD  Homeopathic Products ([employer] LEG CRAMPS PAIN RELIEF PO) Take 1-2 tablets by mouth at bedtime as needed.   Yes Historical Provider, MD  ibuprofen (ADVIL,MOTRIN) 200 MG tablet Take 400 mg by mouth every 6 (six) hours as needed.   Yes Historical Provider, MD  methocarbamol (ROBAXIN) 750 MG tablet Take 750 mg by mouth every 6 (six) hours as needed for muscle spasms.   Yes Historical Provider, MD  Multiple Vitamin (CALCIUM COMPLEX PO) Take 1 scoop by mouth daily.   Yes Historical Provider, MD  oxyCODONE-acetaminophen (PERCOCET) 10-325 MG tablet Take 1 tablet by mouth every 6 (six) hours as needed for pain.   Yes Historical Provider, MD  pravastatin (PRAVACHOL) 80 MG tablet Take 80 mg by mouth daily.   Yes Historical Provider, MD  Probiotic Product (PROBIOTIC DAILY PO) Take 1 capsule by mouth daily.   Yes Historical Provider, MD  Tetrahydrozoline-PEG (EYE DROPS EXTRA OP) Apply 1-2 drops to eye as needed.   Yes Historical Provider, MD  traZODone (DESYREL) 50 MG tablet Take 75 mg by mouth at bedtime as needed for sleep.   Yes Historical Provider,  MD  vitamin B-12 (CYANOCOBALAMIN) 1000 MCG tablet Take 1,000 mcg by mouth daily.   Yes Historical Provider, MD  docusate sodium (COLACE) 100 MG capsule Take 200 mg by mouth daily as needed for mild constipation ([facility]).    Historical Provider, MD   No Known Allergies  Social History  Substance Use Topics  . Smoking status: Never Smoker   . Smokeless tobacco: Not on file  . Alcohol Use: No    History reviewed. No pertinent family history.   Review of Systems  Positive ROS: neg  All other systems have been reviewed and were otherwise negative with the exception of those mentioned in the HPI and as above.  Objective: Vital signs in last 24 hours: Temp:  [97.8 F (36.6 C)] 97.8 F (36.6 C) (03/15 0717) Pulse Rate:  [63] 63 (03/15 0717) Resp:  [18] 18 (03/15 0717) BP: (114)/(67) 114/67 mmHg (03/15 0717) SpO2:  [98 %] 98 % (03/15  0717) Weight:  [83.915 kg (185 lb)] 83.915 kg (185 lb) (03/15 0717)  General Appearance: Alert, cooperative, no distress, appears stated age Head: Normocephalic, without obvious abnormality, atraumatic Eyes: PERRL, conjunctiva/corneas clear, EOM's intact    Neck: Supple, symmetrical, trachea midline Back: Symmetric, no curvature, ROM normal, no CVA tenderness Lungs:  respirations unlabored Heart: Regular rate and rhythm Abdomen: Soft, non-tender Extremities: Extremities normal, atraumatic, no cyanosis or edema Pulses: 2+ and symmetric all extremities Skin: Skin color, texture, turgor normal, no rashes or lesions  NEUROLOGIC:   Mental status: Alert and oriented x4,  no aphasia, good attention span, fund of knowledge, and memory Motor Exam - grossly normal Sensory Exam - grossly normal Reflexes: 1= Coordination - grossly normal Gait - grossly normal Balance - grossly normal Cranial Nerves: I: smell Not tested  II: visual acuity  OS: nl    OD: nl  II: visual fields Full to confrontation  II: pupils Equal, round, reactive to light  III,VII: ptosis None  III,IV,VI: extraocular muscles  Full ROM  V: mastication Normal  V: facial light touch sensation  Normal  V,VII: corneal reflex  Present  VII: facial muscle function - upper  Normal  VII: facial muscle function - lower Normal  VIII: hearing Not tested  IX: soft palate elevation  Normal  IX,X: gag reflex Present  XI: trapezius strength  5/5  XI: sternocleidomastoid strength 5/5  XI: neck flexion strength  5/5  XII: tongue strength  Normal    Data Review Lab Results  Component Value Date   WBC 5.9 11/25/2015   HGB 14.0 11/25/2015   HCT 41.2 11/25/2015   MCV 88.8 11/25/2015   PLT 265 11/25/2015   Lab Results  Component Value Date   NA 141 11/25/2015   K 4.6 11/25/2015   CL 106 11/25/2015   CO2 27 11/25/2015   BUN 13 11/25/2015   CREATININE 1.02 11/25/2015   GLUCOSE 97 11/25/2015   Lab Results  Component Value  Date   INR 1.16 11/25/2015    Assessment/Plan: Patient admitted for XLIF L3-4. Patient has failed a reasonable attempt at conservative therapy.  I explained the condition and procedure to the patient and answered any questions.  Patient wishes to proceed with procedure as planned. Understands risks/ benefits and typical outcomes of procedure.   Briannie Gutierrez S 12/02/2015 8:01 AM

## 2015-12-02 NOTE — Anesthesia Postprocedure Evaluation (Signed)
Anesthesia Post Note  Patient: Jesse [name]  Procedure(s) Performed: Procedure(s) (LRB): L3-4 ANTERIOR LATERAL LUMBAR FUSION w/lateral plate (N/A)  Patient location during evaluation: PACU Anesthesia Type: General Level of consciousness: sedated Pain management: satisfactory to patient Vital Signs Assessment: post-procedure vital signs reviewed and stable Respiratory status: spontaneous breathing Cardiovascular status: stable Anesthetic complications: no Comments: Moves extremities without difficulty. Comfortable    Last Vitals:  Filed Vitals:   12/02/15 1047 12/02/15 1049  BP:  141/79  Pulse:  97  Temp: 36.3 C   Resp:  36    Last Pain:  Filed Vitals:   12/02/15 1057  PainSc: 8                  Oma Alpert EDWARD

## 2015-12-02 NOTE — Anesthesia Procedure Notes (Signed)
Procedure Name: Intubation Date/Time: 12/02/2015 8:25 AM Performed by: Mariea Clonts Pre-anesthesia Checklist: Emergency Drugs available, Timeout performed, Suction available, Patient identified and Patient being monitored Patient Re-evaluated:Patient Re-evaluated prior to inductionPreoxygenation: Pre-oxygenation with 100% oxygen Intubation Type: IV induction Ventilation: Mask ventilation without difficulty Laryngoscope Size: Miller and 2 Grade View: Grade II Tube type: Oral Tube size: 7.5 mm Number of attempts: 1 Placement Confirmation: ETT inserted through vocal cords under direct vision,  breath sounds checked- equal and bilateral and positive ETCO2 Tube secured with: Tape Dental Injury: Teeth and Oropharynx as per pre-operative assessment

## 2015-12-02 NOTE — Op Note (Signed)
12/02/2015  10:51 AM  PATIENT:  Jesse [name]  75 y.o. male  PRE-OPERATIVE DIAGNOSIS:  Pseudoarthrosis with spondylolisthesis L3-4 back and right leg pain  POST-OPERATIVE DIAGNOSIS:  Same  PROCEDURE:  1. Anterolateral retroperitoneal interbody fusion L3-4 utilizing a peek interbody cage packed with morcellized allograft, 2. Lateral plating L3-4 utilizing a nuvasive 4-hole plate  SURGEON:  Sherley Bounds, MD  ASSISTANTS: none  ANESTHESIA:   General  EBL: 50 ml  Total I/O In: 800 [I.V.:800] Out: 50 [Blood:50]  BLOOD ADMINISTERED:none  DRAINS: none   SPECIMEN:  No Specimen  INDICATION FOR PROCEDURE: This patient underwent a previous L1-S1 fusion at outside institution. After hardware extraction at another institution. Developed back and right leg pain. CT scan showed what I thought was a pseudoarthrosis with spondylolisthesis at L3-4 with vacuum disc phenomenon. He tried medical management without relief. His pain was debilitating. I recommended anterolateral retroperitoneal interbody fusion in hopes of improvement of his pain syndrome. Patient understood the risks, benefits, and alternatives and potential outcomes and wished to proceed.  PROCEDURE DETAILS: The patient was brought to the operating room and after induction of adequate generalized endotracheal anesthesia, the patient was positioned in the right lateral decubitus position exposing the left side. The patient was positioned in the typical XLIF fashion and taped into position and trajectories were confirmed with AP lateral fluoroscopy. The skin was cleaned and then prepped with DuraPrep and draped in the usual sterile fashion. 5 cc of local anesthesia was injected. A lateral incision was made over the appropriate disc space and a incision was made posterior lateral to this. Blunt finger dissection was used to enter the retroperitoneal space. I could palpate the psoas musculature as well as the anterior face of the transverse  process, and I could feel the fatty tissues of the retroperitoneum. I swept my finger up to the lateral incision and passed my first dilator down to psoas musculature utilizing my index finger. We then used EMG monitoring to monitor our dilator. We checked our position with fluoroscopy and then placed a K wire into the disc space at L3-4, and then used sequential dilators until our final retractor was in place. We then positioned it utilizing AP lateral fluoroscopy, and used our ball probe to make sure there were no neural structures within the working channel. I then placed the intradiscal shim, and opened my retractor further. The annulus was then incised and the discectomy was done with pituitaries. The annulus was removed from the endplates with a Cobb and the opposite annulus was opened and released. I then used a series of scrapers and shavers to prepare the endplates, taking care not to violate the endplates. I then placed my 16 mm paddle across the disc space to further open opposite annulus and I checked the trajectory with AP and lateral fluoroscopy. I then used sequential trials to determine the correct size cage. The 10 lordotic trial fit the best, so a corresponding cage was selected and packed with morcellized allograft. Using sliders it was then tapped gently into the disc space utilizing AP fluoroscopy until it was appropriately positioned. I then irrigated with saline solution containing bacitracin and dried any bleeding points. I checked my final construct with AP lateral fluoroscopy, and turned my attention to the lateral plating. I placed a 4 hole plate over the interspace and checked its position with AP and lateral fluoroscopy. I then used sequential awls to all each hole under AP fluoroscopy. I then placed a 55 mm bicortical screws  through the plate and locked these into the plate while the locking mechanism and then locked the midline of the plate after first in the break in the table. I then  checked my final construct with AP and lateral fluoroscopy, removed my retractor, and closed the incisions with layers of 0 Vicryl in the fascia, 2-0 Vicryl in the subcutaneous tissues, and 3-0 Vicryl in the subcuticular tissues. The skin was closed with Dermabond.  The patient was then awakened from general anesthesia and transported to the recovery room in stable condition. At the end of the procedure all sponge, needle, and instrument counts were correct.   PLAN OF CARE: Admit to inpatient   PATIENT DISPOSITION:  PACU - hemodynamically stable.   Delay start of Pharmacological VTE agent (>24hrs) due to surgical blood loss or risk of bleeding:  yes

## 2015-12-03 ENCOUNTER — Encounter (HOSPITAL_COMMUNITY): Payer: Self-pay | Admitting: Neurological Surgery

## 2015-12-03 MED ORDER — CELECOXIB 200 MG PO CAPS: 200.0000 mg | ORAL_CAPSULE | Freq: Two times a day (BID) | ORAL | Status: DC

## 2015-12-03 MED ORDER — OXYCODONE-ACETAMINOPHEN 10-325 MG PO TABS
1.0000 | ORAL_TABLET | Freq: Four times a day (QID) | ORAL | Status: DC | PRN
Start: 2015-12-03 — End: 2019-10-31

## 2015-12-03 NOTE — Evaluation (Signed)
Physical Therapy Evaluation Patient Details Name: Jesse [name] MRN: [id number] DOB: 03-05-1941 Today's Date: 12/03/2015   History of Present Illness  Pt admitted for L3-4 fusion. PMHx: anxiety, neck and back fusion previously, TURP  Clinical Impression  Pt very pleasant and moving well. Pt with maintained trunk flexion with activity with cues for posture throughout. Pt moving well and reports pain 6/10 throughout but tolerable. Pt and wife educated for all transfers, precautions and mobility with handout provided. Pt encouraged to walk throughout the day as well as adhere to education. Pt with decreased mobility, posture and pain who will benefit from acute therapy to maximize independence and mobility prior to D/C.     Follow Up Recommendations No PT follow up    Equipment Recommendations  None recommended by PT    Recommendations for Other Services       Precautions / Restrictions Precautions Precautions: Back Precaution Booklet Issued: Yes (comment) Required Braces or Orthoses: Spinal Brace Spinal Brace: Applied in sitting position      Mobility  Bed Mobility Overal bed mobility: Needs Assistance Bed Mobility: Rolling;Sidelying to Sit Rolling: Supervision Sidelying to sit: Supervision       General bed mobility comments: cues for sequence and precautions  Transfers Overall transfer level: Modified independent                  Ambulation/Gait Ambulation/Gait assistance: Supervision Ambulation Distance (Feet): 500 Feet Assistive device: None Gait Pattern/deviations: WFL(Within Functional Limits)   Gait velocity interpretation: at or above normal speed for age/gender General Gait Details: cues for posture as pt maintains forward head and trunk but able to partially correct with visual and tactile cues  Stairs Stairs: Yes Stairs assistance: Modified independent (Device/Increase time) Stair Management: One rail Right;Alternating pattern;Forwards Number of  Stairs: 2    Wheelchair Mobility    Modified Rankin (Stroke Patients Only)       Balance                                             Pertinent Vitals/Pain Pain Assessment: 0-10 Pain Score: 6  Pain Location: back Pain Descriptors / Indicators: Aching Pain Intervention(s): Limited activity within patient's tolerance;Repositioned;Monitored during session    Home Living Family/patient expects to be discharged to:: Private residence Living Arrangements: Spouse/significant other Available Help at Discharge: Family Type of Home: House Home Access: Stairs to enter Entrance Stairs-Rails: Psychiatric nurse of Steps: 2 Home Layout: One level Home Equipment: Toilet riser;Walker - 2 wheels;Cane - single point;Adaptive equipment      Prior Function Level of Independence: Independent         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        Extremity/Trunk Assessment   Upper Extremity Assessment: Overall WFL for tasks assessed           Lower Extremity Assessment: Overall WFL for tasks assessed      Cervical / Trunk Assessment: Kyphotic  Communication   Communication: No difficulties  Cognition Arousal/Alertness: Awake/alert Behavior During Therapy: WFL for tasks assessed/performed Overall Cognitive Status: Within Functional Limits for tasks assessed                      General Comments      Exercises        Assessment/Plan  PT Assessment Patient needs continued PT services  PT Diagnosis Acute pain   PT Problem List Decreased activity tolerance;Decreased mobility;Pain;Decreased knowledge of precautions  PT Treatment Interventions Gait training;Functional mobility training;Therapeutic activities;Patient/family education   PT Goals (Current goals can be found in the Care Plan section) Acute Rehab PT Goals Patient Stated Goal: return to yard work PT Goal Formulation: With  patient/family Time For Goal Achievement: 12/10/15 Potential to Achieve Goals: Good    Frequency Min 5X/week   Barriers to discharge        Co-evaluation               End of Session Equipment Utilized During Treatment: Back brace Activity Tolerance: Patient tolerated treatment well Patient left: in chair;with call bell/phone within reach;with family/visitor present Nurse Communication: Mobility status         Time: [id number] PT Time Calculation (min) (ACUTE ONLY): 21 min   Charges:   PT Evaluation $PT Eval Moderate Complexity: 1 Procedure     PT G CodesMelford Aase 12/03/2015, 8:58 AM Elwyn Reach, [place]

## 2015-12-03 NOTE — Discharge Summary (Signed)
Physician Discharge Summary  Patient ID: Jesse [name] MRN: [id number] DOB/AGE: [birth year] 77 y.o.  Admit date: 12/02/2015 Discharge date: 12/03/2015  Admission Diagnoses: Lumbar pseudoarthrosis and spondylolisthesis L3-4    Discharge Diagnoses: Same   Discharged Condition: good  Hospital Course: The patient was admitted on 12/02/2015 and taken to the operating room where the patient underwent XLIF L3-4. The patient tolerated the procedure well and was taken to the recovery room and then to the floor in stable condition. The hospital course was routine. There were no complications. The wound remained clean dry and intact. Pt had appropriate back soreness. No complaints of leg pain or new N/T/W. The patient remained afebrile with stable vital signs, and tolerated a regular diet. The patient continued to increase activities, and pain was well controlled with oral pain medications.   Consults: None  Significant Diagnostic Studies:  Results for orders placed or performed during the hospital encounter of 11/25/15  Surgical pcr screen  Result Value Ref Range   MRSA, PCR NEGATIVE NEGATIVE   Staphylococcus aureus NEGATIVE NEGATIVE  Basic metabolic panel  Result Value Ref Range   Sodium 141 135 - 145 mmol/L   Potassium 4.6 3.5 - 5.1 mmol/L   Chloride 106 101 - 111 mmol/L   CO2 27 22 - 32 mmol/L   Glucose, Bld 97 65 - 99 mg/dL   BUN 13 6 - 20 mg/dL   Creatinine, Ser 1.02 0.61 - 1.24 mg/dL   Calcium 9.3 8.9 - 10.3 mg/dL   GFR calc non Af Amer >60 >60 mL/min   GFR calc Af Amer >60 >60 mL/min   Anion gap 8 5 - 15  CBC WITH DIFFERENTIAL  Result Value Ref Range   WBC 5.9 4.0 - 10.5 K/uL   RBC 4.64 4.22 - 5.81 MIL/uL   Hemoglobin 14.0 13.0 - 17.0 g/dL   HCT 41.2 39.0 - 52.0 %   MCV 88.8 78.0 - 100.0 fL   MCH 30.2 26.0 - 34.0 pg   MCHC 34.0 30.0 - 36.0 g/dL   RDW 13.5 11.5 - 15.5 %   Platelets 265 150 - 400 K/uL   Neutrophils Relative % 73 %   Neutro Abs 4.3 1.7 - 7.7 K/uL    Lymphocytes Relative 17 %   Lymphs Abs 1.0 0.7 - 4.0 K/uL   Monocytes Relative 7 %   Monocytes Absolute 0.4 0.1 - 1.0 K/uL   Eosinophils Relative 3 %   Eosinophils Absolute 0.2 0.0 - 0.7 K/uL   Basophils Relative 0 %   Basophils Absolute 0.0 0.0 - 0.1 K/uL  Protime-INR  Result Value Ref Range   Prothrombin Time 15.0 11.6 - 15.2 seconds   INR 1.16 0.00 - 1.49  Type and screen [place]  Result Value Ref Range   ABO/RH(D) O POS    Antibody Screen NEG    Sample Expiration 12/09/2015    Extend sample reason NO TRANSFUSIONS OR PREGNANCY IN THE PAST 3 MONTHS   ABO/Rh  Result Value Ref Range   ABO/RH(D) O POS     Chest 2 View  11/25/2015  CLINICAL DATA:  Lumbar fusion. EXAM: CHEST  2 VIEW COMPARISON:  06/25/2013. FINDINGS: Mediastinum and hilar structures normal. Lungs are clear. Heart size normal. No pleural effusion pneumothorax. Surgical clips upper abdomen. Prior cervical spine fusion. IMPRESSION: No acute cardiopulmonary disease. Electronically Signed   By: Marcello Moores  Register   On: 11/25/2015 13:44   Dg Lumbar Spine 2-3 Views  12/02/2015  CLINICAL DATA:  Posterior fusion of L3-4. EXAM: LUMBAR SPINE - 2-3 VIEW; DG C-ARM 61-120 MIN FLUOROSCOPY TIME:  1 minute 47 seconds. COMPARISON:  November 26, 2015. FINDINGS: Three intraoperative fluoroscopic images of the lower lumbar spine demonstrate the patient be status post lateral plate fusion of [id number]. Good alignment of vertebral bodies is noted. IMPRESSION: Status post lateral plate fusion of X33443. Electronically Signed   By: Marijo Conception, M.D.   On: 12/02/2015 11:07   Dg C-arm 61-120 Min  12/02/2015  CLINICAL DATA:  Posterior fusion of L3-4. EXAM: LUMBAR SPINE - 2-3 VIEW; DG C-ARM 61-120 MIN FLUOROSCOPY TIME:  1 minute 47 seconds. COMPARISON:  November 26, 2015. FINDINGS: Three intraoperative fluoroscopic images of the lower lumbar spine demonstrate the patient be status post lateral plate fusion of [id number]. Good alignment of vertebral  bodies is noted. IMPRESSION: Status post lateral plate fusion of X33443. Electronically Signed   By: Marijo Conception, M.D.   On: 12/02/2015 11:07    Antibiotics:  Anti-infectives    Start     Dose/Rate Route Frequency Ordered Stop   12/02/15 1600  ceFAZolin (ANCEF) IVPB 1 g/50 mL premix     1 g 100 mL/hr over 30 Minutes Intravenous Every 8 hours 12/02/15 1153 12/02/15 2339   12/02/15 0903  bacitracin 50,000 Units in sodium chloride irrigation 0.9 % 500 mL irrigation  Status:  Discontinued       As needed 12/02/15 0904 12/02/15 1046   12/02/15 0800  ceFAZolin (ANCEF) IVPB 2 g/50 mL premix     2 g 100 mL/hr over 30 Minutes Intravenous To [facility] 12/01/15 1304 12/02/15 0846      Discharge Exam: Blood pressure 131/57, pulse 57, temperature 98.3 F (36.8 C), temperature source Oral, resp. rate 18, weight 83.915 kg (185 lb), SpO2 97 %. Neurologic: Grossly normal Incisions clean dry and intact  Discharge Medications:     Medication List    STOP taking these medications        ibuprofen 200 MG tablet  Commonly known as:  ADVIL,MOTRIN      TAKE these medications        ALPRAZolam 0.5 MG tablet  Commonly known as:  XANAX  Take 0.5 mg by mouth 3 (three) times daily.     CALCIUM COMPLEX PO  Take 1 scoop by mouth daily.     celecoxib 200 MG capsule  Commonly known as:  CELEBREX  Take 1 capsule (200 mg total) by mouth 2 (two) times daily.     [employer] LEG CRAMPS PAIN RELIEF PO  Take 1-2 tablets by mouth at bedtime as needed.     docusate sodium 100 MG capsule  Commonly known as:  COLACE  Take 200 mg by mouth daily as needed for mild constipation (Geri-care).     EYE DROPS EXTRA OP  Apply 1-2 drops to eye as needed.     ferrous sulfate 325 (65 FE) MG tablet  Take 325 mg by mouth daily with breakfast.     gabapentin 600 MG tablet  Commonly known as:  NEURONTIN  Take 600 mg by mouth 3 (three) times daily.     methocarbamol 750 MG tablet  Commonly known as:  ROBAXIN   Take 750 mg by mouth every 6 (six) hours as needed for muscle spasms.     oxyCODONE-acetaminophen 10-325 MG tablet  Commonly known as:  PERCOCET  Take 1 tablet by mouth every 6 (six) hours as needed for pain.     pravastatin  80 MG tablet  Commonly known as:  PRAVACHOL  Take 80 mg by mouth daily.     PROBIOTIC DAILY PO  Take 1 capsule by mouth daily.     traZODone 50 MG tablet  Commonly known as:  DESYREL  Take 75 mg by mouth at bedtime as needed for sleep.     vitamin B-12 1000 MCG tablet  Commonly known as:  CYANOCOBALAMIN  Take 1,000 mcg by mouth daily.        Disposition: Home   Final Dx: XLIF L3-4      Discharge Instructions    Call MD for:  difficulty breathing, headache or visual disturbances    Complete by:  As directed      Call MD for:  persistant nausea and vomiting    Complete by:  As directed      Call MD for:  redness, tenderness, or signs of infection (pain, swelling, redness, odor or green/yellow discharge around incision site)    Complete by:  As directed      Call MD for:  severe uncontrolled pain    Complete by:  As directed      Call MD for:  temperature >100.4    Complete by:  As directed      Diet - low sodium heart healthy    Complete by:  As directed      Discharge instructions    Complete by:  As directed   No bending or twisting or driving, no heavy lifting, may shower     Increase activity slowly    Complete by:  As directed               Signed: Neftali Thurow S 12/03/2015, 11:15 AM

## 2015-12-03 NOTE — Evaluation (Signed)
Occupational Therapy Evaluation Patient Details Name: Jesse [name] MRN: [id number] DOB: December 27, 1940 Today's Date: 12/03/2015    History of Present Illness Pt admitted for L3-4 fusion. PMHx: anxiety, neck and back fusion previously, TURP   Clinical Impression   Pt reports he required assist from his wife with LB ADLs PTA. Currently pt is overall supervision for safety with ADLs and functional mobility with the exception of min assist for LB ADLs. All ADL, safety, and back education completed; pt and wife with no further questions or concerns for OT at this time. Pt planning to d/c home with 24/7 supervision from his wife. No further acute OT needs identified; signing off at this time. Please re-consult if needs change. Thank you for this referral.    Follow Up Recommendations  No OT follow up;Supervision/Assistance - 24 hour (initially)    Equipment Recommendations  3 in 1 bedside comode    Recommendations for Other Services       Precautions / Restrictions Precautions Precautions: Back Precaution Booklet Issued: Yes (comment) Precaution Comments: Reviewed precautions with pt and wife. Required Braces or Orthoses: Spinal Brace Spinal Brace: Lumbar corset;Applied in sitting position Restrictions Weight Bearing Restrictions: No      Mobility Bed Mobility Overal bed mobility: Needs Assistance Bed Mobility: Rolling;Sidelying to Sit Rolling: Supervision Sidelying to sit: Supervision       General bed mobility comments: Pt OOB in chair upon arrival.  Transfers Overall transfer level: Modified independent Equipment used: None             General transfer comment: for sit to stand    Balance Overall balance assessment: No apparent balance deficits (not formally assessed)                                          ADL Overall ADL's : Needs assistance/impaired Eating/Feeding: Modified independent;Sitting   Grooming:  Supervision/safety;Standing Grooming Details (indicate cue type and reason): Educated on use of 2 cups for oral care. Upper Body Bathing: Supervision/ safety;Sitting   Lower Body Bathing: Minimal assistance;Sit to/from stand   Upper Body Dressing : Supervision/safety;Sitting   Lower Body Dressing: Minimal assistance;Sit to/from stand Lower Body Dressing Details (indicate cue type and reason): Wife to assist with LB ADLs as needed. Toilet Transfer: Supervision/safety;Ambulation;BSC (BSC over toilet) Toilet Transfer Details (indicate cue type and reason): Educated on use of 3 in 1 over toilet Toileting- Clothing Manipulation and Hygiene: Supervision/safety;Sit to/from stand Toileting - Clothing Manipulation Details (indicate cue type and reason): Pt able to reach bottom without twisting   Tub/Shower Transfer Details (indicate cue type and reason): Educated pt and wife on use of 3 in 1 in tub as a shower seat Functional mobility during ADLs: Supervision/safety General ADL Comments: Pts wife present for OT eval. Educated on home safety, maintaining precautions during functional activities, donning/doffing back brace. VCs throughout functional activities to maintain precations and stand up straight with functional mobility.      Vision Vision Assessment?: No apparent visual deficits   Perception     Praxis      Pertinent Vitals/Pain Pain Assessment: 0-10 Pain Score: 5  Pain Location: back Pain Descriptors / Indicators: Aching;Sore Pain Intervention(s): Limited activity within patient's tolerance;Monitored during session     Hand Dominance     Extremity/Trunk Assessment Upper Extremity Assessment Upper Extremity Assessment: Overall WFL for tasks assessed   Lower Extremity Assessment Lower Extremity Assessment:  Defer to PT evaluation   Cervical / Trunk Assessment Cervical / Trunk Assessment: Kyphotic   Communication Communication Communication: No difficulties   Cognition  Arousal/Alertness: Awake/alert Behavior During Therapy: WFL for tasks assessed/performed Overall Cognitive Status: Within Functional Limits for tasks assessed                     General Comments       Exercises       Shoulder Instructions      Home Living Family/patient expects to be discharged to:: Private residence Living Arrangements: Spouse/significant other Available Help at Discharge: Family;Available 24 hours/day Type of Home: House Home Access: Stairs to enter [employer] of Steps: 2 Entrance Stairs-Rails: Right;Left Home Layout: One level     Bathroom Shower/Tub: Tub/shower unit Shower/tub characteristics: Architectural technologist: Standard     Home Equipment: Toilet riser;Walker - 2 wheels;Cane - single point;Adaptive equipment Adaptive Equipment: Reacher        Prior Functioning/Environment Level of Independence: Needs assistance    ADL's / Homemaking Assistance Needed: Wife assisted with LB ADLs PTA.   Comments: pt enjoys cutting trees and working on cars    OT Diagnosis: Acute pain   OT Problem List:     OT Treatment/Interventions:      OT Goals(Current goals can be found in the care plan section) Acute Rehab OT Goals Patient Stated Goal: return to yard work OT Goal Formulation: All assessment and education complete, DC therapy  OT Frequency:     Barriers to D/C:            Co-evaluation              End of Session Equipment Utilized During Treatment: Back brace Nurse Communication: Other (comment) (pt needs 3 in 1)  Activity Tolerance: Patient tolerated treatment well Patient left: in chair;with call bell/phone within reach;with family/visitor present   Time: 0823-0839 OT Time Calculation (min): 16 min Charges:  OT General Charges $OT Visit: 1 Procedure OT Evaluation $OT Eval Moderate Complexity: 1 Procedure G-Codes:     Binnie Kand M.S., OTR/L Pager: [phone]  12/03/2015, 9:41 AM

## 2016-01-27 ENCOUNTER — Other Ambulatory Visit: Payer: Self-pay | Admitting: Neurological Surgery

## 2016-01-27 DIAGNOSIS — S32038S Other fracture of third lumbar vertebra, sequela: Secondary | ICD-10-CM

## 2016-02-01 ENCOUNTER — Ambulatory Visit
Admission: RE | Admit: 2016-02-01 | Discharge: 2016-02-01 | Disposition: A | Discharge status: Home / Self Care | Payer: Medicare Other | Source: Ambulatory Visit | Attending: Neurological Surgery | Admitting: Neurological Surgery

## 2016-02-01 DIAGNOSIS — S32038S Other fracture of third lumbar vertebra, sequela: Secondary | ICD-10-CM

## 2016-09-29 DIAGNOSIS — R69 Illness, unspecified: Secondary | ICD-10-CM | POA: Diagnosis not present

## 2016-10-18 DIAGNOSIS — R69 Illness, unspecified: Secondary | ICD-10-CM | POA: Diagnosis not present

## 2016-10-18 DIAGNOSIS — C9111 Chronic lymphocytic leukemia of B-cell type in remission: Secondary | ICD-10-CM | POA: Diagnosis not present

## 2016-10-18 DIAGNOSIS — E038 Other specified hypothyroidism: Secondary | ICD-10-CM | POA: Diagnosis not present

## 2016-10-18 DIAGNOSIS — I2510 Atherosclerotic heart disease of native coronary artery without angina pectoris: Secondary | ICD-10-CM | POA: Diagnosis not present

## 2016-10-18 DIAGNOSIS — D6949 Other primary thrombocytopenia: Secondary | ICD-10-CM | POA: Diagnosis not present

## 2016-10-18 DIAGNOSIS — E782 Mixed hyperlipidemia: Secondary | ICD-10-CM | POA: Diagnosis not present

## 2016-10-18 DIAGNOSIS — M545 Low back pain: Secondary | ICD-10-CM | POA: Diagnosis not present

## 2016-10-18 DIAGNOSIS — I119 Hypertensive heart disease without heart failure: Secondary | ICD-10-CM | POA: Diagnosis not present

## 2016-10-27 DIAGNOSIS — Z6835 Body mass index (BMI) 35.0-35.9, adult: Secondary | ICD-10-CM | POA: Diagnosis not present

## 2016-10-27 DIAGNOSIS — R69 Illness, unspecified: Secondary | ICD-10-CM | POA: Diagnosis not present

## 2016-10-27 DIAGNOSIS — M961 Postlaminectomy syndrome, not elsewhere classified: Secondary | ICD-10-CM | POA: Diagnosis not present

## 2016-10-27 DIAGNOSIS — D696 Thrombocytopenia, unspecified: Secondary | ICD-10-CM | POA: Diagnosis not present

## 2016-11-09 DIAGNOSIS — C44629 Squamous cell carcinoma of skin of left upper limb, including shoulder: Secondary | ICD-10-CM | POA: Diagnosis not present

## 2016-11-09 DIAGNOSIS — L821 Other seborrheic keratosis: Secondary | ICD-10-CM | POA: Diagnosis not present

## 2016-11-15 DIAGNOSIS — C44629 Squamous cell carcinoma of skin of left upper limb, including shoulder: Secondary | ICD-10-CM | POA: Diagnosis not present

## 2016-11-21 DIAGNOSIS — C9110 Chronic lymphocytic leukemia of B-cell type not having achieved remission: Secondary | ICD-10-CM | POA: Diagnosis not present

## 2016-11-21 DIAGNOSIS — D693 Immune thrombocytopenic purpura: Secondary | ICD-10-CM | POA: Diagnosis not present

## 2016-12-21 DIAGNOSIS — C9110 Chronic lymphocytic leukemia of B-cell type not having achieved remission: Secondary | ICD-10-CM | POA: Diagnosis not present

## 2016-12-29 DIAGNOSIS — E785 Hyperlipidemia, unspecified: Secondary | ICD-10-CM | POA: Diagnosis not present

## 2016-12-29 DIAGNOSIS — R233 Spontaneous ecchymoses: Secondary | ICD-10-CM | POA: Diagnosis not present

## 2016-12-29 DIAGNOSIS — Z9181 History of falling: Secondary | ICD-10-CM | POA: Diagnosis not present

## 2016-12-29 DIAGNOSIS — M79604 Pain in right leg: Secondary | ICD-10-CM | POA: Diagnosis not present

## 2016-12-29 DIAGNOSIS — G4700 Insomnia, unspecified: Secondary | ICD-10-CM | POA: Diagnosis not present

## 2016-12-29 DIAGNOSIS — R69 Illness, unspecified: Secondary | ICD-10-CM | POA: Diagnosis not present

## 2016-12-29 DIAGNOSIS — R0600 Dyspnea, unspecified: Secondary | ICD-10-CM | POA: Diagnosis not present

## 2016-12-29 DIAGNOSIS — Z6826 Body mass index (BMI) 26.0-26.9, adult: Secondary | ICD-10-CM | POA: Diagnosis not present

## 2016-12-29 DIAGNOSIS — Z0000 Encounter for general adult medical examination without abnormal findings: Secondary | ICD-10-CM | POA: Diagnosis not present

## 2016-12-29 DIAGNOSIS — Z79891 Long term (current) use of opiate analgesic: Secondary | ICD-10-CM | POA: Diagnosis not present

## 2016-12-29 DIAGNOSIS — G8929 Other chronic pain: Secondary | ICD-10-CM | POA: Diagnosis not present

## 2016-12-29 DIAGNOSIS — Z79899 Other long term (current) drug therapy: Secondary | ICD-10-CM | POA: Diagnosis not present

## 2016-12-29 DIAGNOSIS — M79605 Pain in left leg: Secondary | ICD-10-CM | POA: Diagnosis not present

## 2016-12-29 DIAGNOSIS — M542 Cervicalgia: Secondary | ICD-10-CM | POA: Diagnosis not present

## 2017-01-02 DIAGNOSIS — Z6834 Body mass index (BMI) 34.0-34.9, adult: Secondary | ICD-10-CM | POA: Diagnosis not present

## 2017-01-02 DIAGNOSIS — M961 Postlaminectomy syndrome, not elsewhere classified: Secondary | ICD-10-CM | POA: Diagnosis not present

## 2017-01-09 ENCOUNTER — Other Ambulatory Visit: Payer: Self-pay | Admitting: Neurological Surgery

## 2017-01-09 DIAGNOSIS — M5416 Radiculopathy, lumbar region: Secondary | ICD-10-CM | POA: Diagnosis not present

## 2017-01-09 DIAGNOSIS — M961 Postlaminectomy syndrome, not elsewhere classified: Secondary | ICD-10-CM

## 2017-01-16 DIAGNOSIS — M545 Low back pain: Secondary | ICD-10-CM | POA: Diagnosis not present

## 2017-01-16 DIAGNOSIS — R69 Illness, unspecified: Secondary | ICD-10-CM | POA: Diagnosis not present

## 2017-01-16 DIAGNOSIS — I119 Hypertensive heart disease without heart failure: Secondary | ICD-10-CM | POA: Diagnosis not present

## 2017-01-16 DIAGNOSIS — E038 Other specified hypothyroidism: Secondary | ICD-10-CM | POA: Diagnosis not present

## 2017-01-16 DIAGNOSIS — E782 Mixed hyperlipidemia: Secondary | ICD-10-CM | POA: Diagnosis not present

## 2017-01-16 DIAGNOSIS — I2510 Atherosclerotic heart disease of native coronary artery without angina pectoris: Secondary | ICD-10-CM | POA: Diagnosis not present

## 2017-01-17 ENCOUNTER — Ambulatory Visit
Admission: RE | Admit: 2017-01-17 | Discharge: 2017-01-17 | Disposition: A | Discharge status: Home / Self Care | Payer: Medicare HMO | Source: Ambulatory Visit | Attending: Neurological Surgery | Admitting: Neurological Surgery

## 2017-01-17 DIAGNOSIS — M961 Postlaminectomy syndrome, not elsewhere classified: Secondary | ICD-10-CM

## 2017-01-17 DIAGNOSIS — M48061 Spinal stenosis, lumbar region without neurogenic claudication: Secondary | ICD-10-CM | POA: Diagnosis not present

## 2017-01-23 DIAGNOSIS — M961 Postlaminectomy syndrome, not elsewhere classified: Secondary | ICD-10-CM | POA: Diagnosis not present

## 2017-02-27 DIAGNOSIS — E611 Iron deficiency: Secondary | ICD-10-CM | POA: Diagnosis not present

## 2017-02-27 DIAGNOSIS — C9190 Lymphoid leukemia, unspecified not having achieved remission: Secondary | ICD-10-CM | POA: Diagnosis not present

## 2017-02-27 DIAGNOSIS — Z981 Arthrodesis status: Secondary | ICD-10-CM | POA: Diagnosis not present

## 2017-02-27 DIAGNOSIS — D693 Immune thrombocytopenic purpura: Secondary | ICD-10-CM | POA: Diagnosis not present

## 2017-02-27 DIAGNOSIS — M545 Low back pain: Secondary | ICD-10-CM | POA: Diagnosis not present

## 2017-03-21 DIAGNOSIS — R69 Illness, unspecified: Secondary | ICD-10-CM | POA: Diagnosis not present

## 2017-03-29 DIAGNOSIS — C9190 Lymphoid leukemia, unspecified not having achieved remission: Secondary | ICD-10-CM | POA: Diagnosis not present

## 2017-04-17 DIAGNOSIS — M545 Low back pain: Secondary | ICD-10-CM | POA: Diagnosis not present

## 2017-04-17 DIAGNOSIS — I2510 Atherosclerotic heart disease of native coronary artery without angina pectoris: Secondary | ICD-10-CM | POA: Diagnosis not present

## 2017-04-17 DIAGNOSIS — I10 Essential (primary) hypertension: Secondary | ICD-10-CM | POA: Diagnosis not present

## 2017-04-17 DIAGNOSIS — C9111 Chronic lymphocytic leukemia of B-cell type in remission: Secondary | ICD-10-CM | POA: Diagnosis not present

## 2017-04-17 DIAGNOSIS — E038 Other specified hypothyroidism: Secondary | ICD-10-CM | POA: Diagnosis not present

## 2017-04-17 DIAGNOSIS — E782 Mixed hyperlipidemia: Secondary | ICD-10-CM | POA: Diagnosis not present

## 2017-04-17 DIAGNOSIS — R69 Illness, unspecified: Secondary | ICD-10-CM | POA: Diagnosis not present

## 2017-04-17 DIAGNOSIS — D6949 Other primary thrombocytopenia: Secondary | ICD-10-CM | POA: Diagnosis not present

## 2017-05-29 DIAGNOSIS — M4326 Fusion of spine, lumbar region: Secondary | ICD-10-CM | POA: Diagnosis not present

## 2017-05-29 DIAGNOSIS — M545 Low back pain: Secondary | ICD-10-CM | POA: Diagnosis not present

## 2017-05-29 DIAGNOSIS — D693 Immune thrombocytopenic purpura: Secondary | ICD-10-CM | POA: Diagnosis not present

## 2017-05-29 DIAGNOSIS — C9190 Lymphoid leukemia, unspecified not having achieved remission: Secondary | ICD-10-CM | POA: Diagnosis not present

## 2017-05-29 DIAGNOSIS — E611 Iron deficiency: Secondary | ICD-10-CM | POA: Diagnosis not present

## 2017-06-14 DIAGNOSIS — Z23 Encounter for immunization: Secondary | ICD-10-CM | POA: Diagnosis not present

## 2017-07-19 DIAGNOSIS — E038 Other specified hypothyroidism: Secondary | ICD-10-CM | POA: Diagnosis not present

## 2017-07-19 DIAGNOSIS — E782 Mixed hyperlipidemia: Secondary | ICD-10-CM | POA: Diagnosis not present

## 2017-07-19 DIAGNOSIS — I119 Hypertensive heart disease without heart failure: Secondary | ICD-10-CM | POA: Diagnosis not present

## 2017-07-19 DIAGNOSIS — C9111 Chronic lymphocytic leukemia of B-cell type in remission: Secondary | ICD-10-CM | POA: Diagnosis not present

## 2017-07-19 DIAGNOSIS — Z125 Encounter for screening for malignant neoplasm of prostate: Secondary | ICD-10-CM | POA: Diagnosis not present

## 2017-07-27 DIAGNOSIS — C9111 Chronic lymphocytic leukemia of B-cell type in remission: Secondary | ICD-10-CM | POA: Diagnosis not present

## 2017-07-27 DIAGNOSIS — R69 Illness, unspecified: Secondary | ICD-10-CM | POA: Diagnosis not present

## 2017-07-27 DIAGNOSIS — E782 Mixed hyperlipidemia: Secondary | ICD-10-CM | POA: Diagnosis not present

## 2017-07-27 DIAGNOSIS — D6949 Other primary thrombocytopenia: Secondary | ICD-10-CM | POA: Diagnosis not present

## 2017-07-27 DIAGNOSIS — E038 Other specified hypothyroidism: Secondary | ICD-10-CM | POA: Diagnosis not present

## 2017-07-27 DIAGNOSIS — Z6827 Body mass index (BMI) 27.0-27.9, adult: Secondary | ICD-10-CM | POA: Diagnosis not present

## 2017-07-27 DIAGNOSIS — I2510 Atherosclerotic heart disease of native coronary artery without angina pectoris: Secondary | ICD-10-CM | POA: Diagnosis not present

## 2017-07-27 DIAGNOSIS — M545 Low back pain: Secondary | ICD-10-CM | POA: Diagnosis not present

## 2017-07-31 DIAGNOSIS — L570 Actinic keratosis: Secondary | ICD-10-CM | POA: Diagnosis not present

## 2017-08-05 DIAGNOSIS — B029 Zoster without complications: Secondary | ICD-10-CM | POA: Diagnosis not present

## 2017-08-05 DIAGNOSIS — M25559 Pain in unspecified hip: Secondary | ICD-10-CM | POA: Diagnosis not present

## 2017-09-07 ENCOUNTER — Encounter: Payer: Self-pay | Admitting: Family Medicine

## 2017-09-07 DIAGNOSIS — C9111 Chronic lymphocytic leukemia of B-cell type in remission: Secondary | ICD-10-CM | POA: Diagnosis not present

## 2017-09-18 DIAGNOSIS — R109 Unspecified abdominal pain: Secondary | ICD-10-CM | POA: Diagnosis not present

## 2017-09-18 DIAGNOSIS — R197 Diarrhea, unspecified: Secondary | ICD-10-CM | POA: Diagnosis not present

## 2017-09-18 DIAGNOSIS — Z79891 Long term (current) use of opiate analgesic: Secondary | ICD-10-CM | POA: Diagnosis not present

## 2017-09-18 DIAGNOSIS — J181 Lobar pneumonia, unspecified organism: Secondary | ICD-10-CM | POA: Diagnosis not present

## 2017-09-18 DIAGNOSIS — Z79899 Other long term (current) drug therapy: Secondary | ICD-10-CM | POA: Diagnosis not present

## 2017-09-20 DIAGNOSIS — A088 Other specified intestinal infections: Secondary | ICD-10-CM | POA: Diagnosis not present

## 2017-09-20 DIAGNOSIS — J181 Lobar pneumonia, unspecified organism: Secondary | ICD-10-CM | POA: Diagnosis not present

## 2017-09-22 DIAGNOSIS — R233 Spontaneous ecchymoses: Secondary | ICD-10-CM | POA: Diagnosis not present

## 2017-09-22 DIAGNOSIS — C9110 Chronic lymphocytic leukemia of B-cell type not having achieved remission: Secondary | ICD-10-CM | POA: Diagnosis not present

## 2017-09-22 DIAGNOSIS — D6949 Other primary thrombocytopenia: Secondary | ICD-10-CM | POA: Diagnosis not present

## 2017-09-25 DIAGNOSIS — Z85828 Personal history of other malignant neoplasm of skin: Secondary | ICD-10-CM | POA: Diagnosis not present

## 2017-09-25 DIAGNOSIS — D693 Immune thrombocytopenic purpura: Secondary | ICD-10-CM | POA: Diagnosis not present

## 2017-09-25 DIAGNOSIS — C9190 Lymphoid leukemia, unspecified not having achieved remission: Secondary | ICD-10-CM | POA: Diagnosis not present

## 2017-09-25 DIAGNOSIS — C91Z0 Other lymphoid leukemia not having achieved remission: Secondary | ICD-10-CM | POA: Diagnosis not present

## 2017-09-25 DIAGNOSIS — E611 Iron deficiency: Secondary | ICD-10-CM | POA: Diagnosis not present

## 2017-09-26 ENCOUNTER — Ambulatory Visit: Payer: Medicare HMO | Admitting: Cardiology

## 2017-09-26 DIAGNOSIS — R21 Rash and other nonspecific skin eruption: Secondary | ICD-10-CM | POA: Diagnosis not present

## 2017-09-26 DIAGNOSIS — Z856 Personal history of leukemia: Secondary | ICD-10-CM | POA: Diagnosis not present

## 2017-09-26 DIAGNOSIS — Z5320 Procedure and treatment not carried out because of patient's decision for unspecified reasons: Secondary | ICD-10-CM | POA: Diagnosis not present

## 2017-09-26 DIAGNOSIS — M7989 Other specified soft tissue disorders: Secondary | ICD-10-CM | POA: Diagnosis not present

## 2017-09-26 DIAGNOSIS — M79662 Pain in left lower leg: Secondary | ICD-10-CM | POA: Diagnosis not present

## 2017-09-26 DIAGNOSIS — M79661 Pain in right lower leg: Secondary | ICD-10-CM | POA: Diagnosis not present

## 2017-09-27 DIAGNOSIS — M310 Hypersensitivity angiitis: Secondary | ICD-10-CM | POA: Diagnosis not present

## 2017-09-27 DIAGNOSIS — L299 Pruritus, unspecified: Secondary | ICD-10-CM | POA: Diagnosis not present

## 2017-09-27 DIAGNOSIS — L958 Other vasculitis limited to the skin: Secondary | ICD-10-CM | POA: Diagnosis not present

## 2017-09-27 DIAGNOSIS — L853 Xerosis cutis: Secondary | ICD-10-CM | POA: Diagnosis not present

## 2017-09-27 DIAGNOSIS — C9110 Chronic lymphocytic leukemia of B-cell type not having achieved remission: Secondary | ICD-10-CM | POA: Diagnosis not present

## 2017-09-27 DIAGNOSIS — I10 Essential (primary) hypertension: Secondary | ICD-10-CM | POA: Diagnosis not present

## 2017-09-27 DIAGNOSIS — R233 Spontaneous ecchymoses: Secondary | ICD-10-CM | POA: Diagnosis not present

## 2017-09-27 DIAGNOSIS — D6949 Other primary thrombocytopenia: Secondary | ICD-10-CM | POA: Diagnosis not present

## 2017-10-12 DIAGNOSIS — R69 Illness, unspecified: Secondary | ICD-10-CM | POA: Diagnosis not present

## 2017-10-25 DIAGNOSIS — I2510 Atherosclerotic heart disease of native coronary artery without angina pectoris: Secondary | ICD-10-CM

## 2017-10-25 DIAGNOSIS — R079 Chest pain, unspecified: Secondary | ICD-10-CM

## 2017-10-25 DIAGNOSIS — N200 Calculus of kidney: Secondary | ICD-10-CM | POA: Insufficient documentation

## 2017-10-25 DIAGNOSIS — D6959 Other secondary thrombocytopenia: Secondary | ICD-10-CM | POA: Insufficient documentation

## 2017-10-25 DIAGNOSIS — R001 Bradycardia, unspecified: Secondary | ICD-10-CM

## 2017-10-25 DIAGNOSIS — N400 Enlarged prostate without lower urinary tract symptoms: Secondary | ICD-10-CM

## 2017-10-25 DIAGNOSIS — R0600 Dyspnea, unspecified: Secondary | ICD-10-CM

## 2017-10-25 DIAGNOSIS — F419 Anxiety disorder, unspecified: Secondary | ICD-10-CM | POA: Insufficient documentation

## 2017-10-25 DIAGNOSIS — E785 Hyperlipidemia, unspecified: Secondary | ICD-10-CM

## 2017-10-25 DIAGNOSIS — R0609 Other forms of dyspnea: Secondary | ICD-10-CM | POA: Insufficient documentation

## 2017-10-25 DIAGNOSIS — C9110 Chronic lymphocytic leukemia of B-cell type not having achieved remission: Secondary | ICD-10-CM

## 2017-10-25 HISTORY — DX: Other secondary thrombocytopenia: D69.59

## 2017-10-25 HISTORY — DX: Hyperlipidemia, unspecified: E78.5

## 2017-10-25 HISTORY — DX: Calculus of kidney: N20.0

## 2017-10-25 HISTORY — DX: Atherosclerotic heart disease of native coronary artery without angina pectoris: I25.10

## 2017-10-25 HISTORY — DX: Other forms of dyspnea: R06.09

## 2017-10-25 HISTORY — DX: Bradycardia, unspecified: R00.1

## 2017-10-25 HISTORY — DX: Chronic lymphocytic leukemia of B-cell type not having achieved remission: C91.10

## 2017-10-25 HISTORY — DX: Chest pain, unspecified: R07.9

## 2017-10-25 HISTORY — DX: Benign prostatic hyperplasia without lower urinary tract symptoms: N40.0

## 2017-10-25 HISTORY — DX: Dyspnea, unspecified: R06.00

## 2017-10-25 NOTE — Progress Notes (Signed)
Cardiology Office Note:    Date:  10/26/2017   ID:  Jesse [name], DOB 1940/10/18, MRN [id number]  PCP:  Rochel Brome, MD  Cardiologist:  Shirlee More, MD   Referring MD: Rochel Brome, MD  ASSESSMENT:    1. Edema, unspecified type   2. Vasculitis determined by biopsy of skin ([place])   3. Chronic lymphocytic leukemia ([place])    PLAN:    In order of problems listed above:  1. He presents with chronic edema that is worsened in the setting of vasculitis gabapentin and steroid therapy.  This may be all unrelated to his heart but with his weakness exercise intolerance dyspnea murmur on exam is a strong suspect heart failure.  I asked him today to have a BNP level drawn start a low dose of loop diuretic repeat echocardiogram as of a hard time believing that it was normal in 2016 and is at risk for venous thromboembolism d-dimer would likely be significantly elevated nonspecifically vasculitis and do duplexes of the lower extremities.  I asked him to fully sodium restrict and follow-up with me in 2 weeks. 2. Improved but remains on high-dose steroids potentiating edema along with gabapentin managed by his PCP 3. Stable managed by oncology.  I do not think CLL plays a role in his edema.  Next appointment 2 weeks   Medication Adjustments/Labs and Tests Ordered: Current medicines are reviewed at length with the patient today.  Concerns regarding medicines are outlined above.  Orders Placed This Encounter  Procedures  . Basic metabolic panel  . Pro b natriuretic peptide (BNP)  . ECHOCARDIOGRAM COMPLETE   Meds ordered this encounter  Medications  . furosemide (LASIX) 20 MG tablet    Sig: Take 1 tablet (20 mg total) by mouth daily.    Dispense:  90 tablet    Refill:  3     Chief Complaint  Patient presents with  . Follow-up    pt states swelling of his ankles.  . Edema    History of Present Illness:    Jesse [name] is a 77 y.o. male with CLLwho is being seen today for the evaluation  of edema at the request of Cox, Kirsten, MD.  He was seen at [facility] 09/26/17 for rash and edema but left before being seen by physician. ED Provider Notes - Mills Koller, MD - 09/26/2017 5:35 PM EST 5:59 PM I was informed by nursing staff that a patient has eloped after going through our First Look process. While I did not personally see this patient, I did review their test results. Based on this review, the results do not warrant immediate call-back   Seen by Dr Jesse Molly in October 2016 Jesse Molly Joslyn Devon, MD - 07/02/2015 11:59 AM EDT Reason for Visit: Exertional dyspnea and peripheral edema Assessment & Plan: 1. Dyspnea on exertion Echocardiogram W Colorflow Spectral Doppler  2. Bilateral edema of lower extremity Echocardiogram W Colorflow Spectral Doppler  1. Dyspnea on exertion- probable deconditioning 2. Intermittent peripheral edema- ?venous insufficiency - doubt CHF 3. Disabling back/spine disease PLAN: Echocardiogram - phone follow-up with results RTC Cardiology: PRN abnl echo or new/recurrent chest pain HPI: The patient is a 77 year old white male here accompanied by his wife, followed primarily by Dr. Tobie Poet and for hematology now in [place] for CLL with chronic thrombocytopenia. He had been referred to me originally in May 2014 valuation of chest discomfort. I had wanted him to have a Lexiscan perfusion stress test but he canceled that. Is been no  follow-up until he returns today, his wife indicating that he had recall the cancel stress test, and that she become more concerned about the development of periodic bipedal edema and worsening exertional dyspnea. The patient himself is more passive. He denies orthopnea or PND nurse been no palpitations lightheadedness syncope or near syncope remains mostly limited by low back pain. He actually had hardware removed from his back. No fever cough hemoptysis or sputum production. He's no longer troubled with chest discomfort.  I  am unable to access the echo report, but a phone message is in Epic: Telephone Encounter - Marilynne Drivers, [place] - 08/03/2015 11:28 AM EST ----- Message from Orlin Hilding, MD sent at 07/22/2015 12:08 AM EDT ----- plz notify pt that echo came out ok: Normal strength of heart muscle, no indication that periodic foot swelling is from heart failure. No significant valve disease.  Is a long history of lower extremity edema that he attributes to gabapentin and multiple back surgeries.  In the last few months he developed vasculitis with skin biopsies and has been on 2 courses of high-dose steroid therapy.  Associated this he has developed marked fatigue exercise intolerance mild exertional shortness of breath but no orthopnea PND pleuritic chest pain cough shortness of breath as well as marketed edema.  Through all this his weight is been stable.  He had an echocardiogram 2016 and a verbal report pretty much describes it as normal but has a murmur of at least aortic stenosis and mitral regurgitation and clinically appears to be in decompensated heart failure.  Recent laboratory studies are reassuring except for chronic thrombocytopenia from his CLL.  He tells me that his disease is presently quiet symptoms.  He has been seen by dermatology vasculitis is managed by his PCP.  He seeks my attention because he suspects that he is congestive heart failure he has no known history of venous thromboembolism      Past Medical History:  Diagnosis Date  . Anemia    takes Ferrous Sulfate daily  . Anxiety    takes Xanax daily  . Arthritis   . BPH (benign prostatic hyperplasia) 10/25/2017  . Bradycardia 10/25/2017  . CAD (coronary artery disease) 10/25/2017  . Chest pain 10/25/2017  . Chronic back pain   . Chronic ITP (idiopathic thrombocytopenia) (HCC) 08/08/2013  . Chronic lymphocytic leukemia ([place]) 10/25/2017  . Constipation    takes Colace daily as needed  . Dry eyes    eye drops as needed  .  Dyspnea on exertion 10/25/2017  . History of blood transfusion    no abnormal reaction noted  . History of kidney stones   . Hyperlipemia 10/25/2017  . Hyperlipidemia    takes Pravastatin daily  . Infusion reaction 4 yrs ago   platelet infusion broke out head to toe  . Insomnia    takes trazodone nightly as needed  . Leukemia ([place])   . Low back pain 12/12/2012  . Lumbar radiculopathy 01/09/2013  . Muscle spasm    takes Robaxin daily as needed  . Nerve pain    takes Gabapentin daily   . Nocturia   . Renal stones 10/25/2017  . Status post lumbar surgery 12/02/2015  . Thrombocytopenia, secondary 10/25/2017   Overview:  secondary to CLL  . Vitamin B12 deficiency    takes Vit B12 daily  . Weakness    numbness and tingling    Past Surgical History:  Procedure Laterality Date  . ANTERIOR LAT LUMBAR FUSION N/A  12/02/2015   Procedure: L3-4 ANTERIOR LATERAL LUMBAR FUSION w/lateral plate;  Surgeon: Eustace Moore, MD;  Location: [facility];  Service: Neurosurgery;  Laterality: N/A;  L3-4 ANTERIOR LATERAL LUMBAR FUSION w/lateral plate  . BACK SURGERY  2012   fusion x 2  . CATARACT EXTRACTION    . CERVICAL FUSION  1988   x 2  . COLONOSCOPY    . FOOT SURGERY    . HERNIA REPAIR Left    inguinal  . KNEE SURGERY    . LAMINECTOMY    . plates/screws/rods removed  2015  . TOE SURGERY    . TRANSURETHRAL RESECTION OF PROSTATE      Current Medications: Current Meds  Medication Sig  . ALPRAZolam (XANAX) 0.5 MG tablet Take 0.5 mg by mouth 3 (three) times daily.  Marland Kitchen b complex vitamins tablet Take by mouth.  . celecoxib (CELEBREX) 200 MG capsule Take 1 capsule (200 mg total) by mouth 2 (two) times daily.  Marland Kitchen docusate sodium (COLACE) 100 MG capsule Take 200 mg by mouth daily as needed for mild constipation ([facility]).  . ferrous sulfate 325 (65 FE) MG tablet Take 325 mg by mouth daily with breakfast.  . gabapentin (NEURONTIN) 600 MG tablet Take 600 mg by mouth 3 (three) times daily.  . Homeopathic  Products ([employer] LEG CRAMPS PAIN RELIEF PO) Take 1-2 tablets by mouth at bedtime as needed.  . methocarbamol (ROBAXIN) 750 MG tablet Take 750 mg by mouth every 6 (six) hours as needed for muscle spasms.  . Multiple Vitamin (CALCIUM COMPLEX PO) Take 1 scoop by mouth daily.  Marland Kitchen oxyCODONE-acetaminophen (PERCOCET) 10-325 MG tablet Take 1 tablet by mouth every 6 (six) hours as needed for pain.  . pravastatin (PRAVACHOL) 80 MG tablet Take 80 mg by mouth daily.  . predniSONE (DELTASONE) 20 MG tablet   . Probiotic Product (PROBIOTIC DAILY PO) Take 1 capsule by mouth daily.  . Tetrahydrozoline-PEG (EYE DROPS EXTRA OP) Apply 1-2 drops to eye as needed.  . traZODone (DESYREL) 50 MG tablet Take 75 mg by mouth at bedtime as needed for sleep.  . vitamin B-12 (CYANOCOBALAMIN) 1000 MCG tablet Take 1,000 mcg by mouth daily.  . vitamin C (ASCORBIC ACID) 500 MG tablet Take by mouth.     Allergies:   Patient has no known allergies.   Social History   Socioeconomic History  . Marital status: Married    Spouse name: None  . Number of children: None  . Years of education: None  . Highest education level: None  Social Needs  . Financial resource strain: None  . Food insecurity - worry: None  . Food insecurity - inability: None  . Transportation needs - medical: None  . Transportation needs - non-medical: None  Occupational History  . None  Tobacco Use  . Smoking status: Never Smoker  . Smokeless tobacco: Never Used  Substance and Sexual Activity  . Alcohol use: No  . Drug use: No  . Sexual activity: Not Currently  Other Topics Concern  . None  Social History Narrative  . None     Family History: The patient's family history includes CAD in his brother and father; Congestive Heart Failure in his brother; Kidney failure in his brother; Leukemia in his brother; Prostate cancer in his brother.  ROS:   Review of Systems  Constitution: Positive for weakness and weight loss.  HENT: Negative.   Eyes:  Negative.   Cardiovascular: Positive for dyspnea on exertion and leg swelling.  Respiratory: Positive for shortness of breath.   Endocrine: Negative.   Hematologic/Lymphatic: Negative for bleeding problem. Bruises/bleeds easily.  Skin: Positive for rash (vasculitis).  Musculoskeletal: Positive for back pain, joint pain and muscle weakness.  Gastrointestinal: Positive for abdominal pain (with onset of vasculitis).  Genitourinary: Negative.   Psychiatric/Behavioral: Negative.   Allergic/Immunologic: Negative.    Please see the history of present illness.     All other systems reviewed and are negative.  EKGs/Labs/Other Studies Reviewed:    The following studies were reviewed today: EKG today sinus rhythm normal  EKG:  09/27/17 independently reviewed, [facility] LAD 1 PVC  Recent Labs: Collection Time: 09/25/17 10:18 AM  Result Value Ref Range  Sodium 144 135 - 146 MMOL/L  Potassium 4.1 3.5 - 5.3 MMOL/L  Chloride 107 98 - 110 MMOL/L  CO2 30 23 - 30 MMOL/L  BUN 13 8 - 24 MG/DL  Glucose 95 70 - 99 MG/DL  Creatinine 0.95 0.50 - 1.50 MG/DL  Calcium 8.2 (L) 8.5 - 10.5 MG/DL  Total Protein 5.9 (L) 6.0 - 8.3 G/DL  Albumin 3.7 3.5 - 5.0 G/DL  Total Bilirubin 0.7 0.1 - 1.2 MG/DL  Alkaline Phosphatase 98 25 - 125 IU/L  AST (SGOT) 26 5 - 40 IU/L  ALT (SGPT) 14 5 - 50 IU/L  Anion Gap 7 4 - 14 MMOL/L  Est. GFR Non-African American 76 >=60 ML/MIN/1.73 M*2  Est. GFR African American 88 >=60 ML/MIN/1.73 M*2  Magnesium  Collection Time: 09/25/17 10:18 AM  Result Value Ref Range  Magnesium 2.0 1.8 - 2.4 MG/DL  Phosphorus  Collection Time: 09/25/17 10:18 AM  Result Value Ref Range  Phosphorus 3.2 2.5 - 4.5 MG/DL  LDH  Collection Time: 09/25/17 10:18 AM  Result Value Ref Range  LDH 176 90 - 271 IU/L  Uric Acid  Collection Time: 09/25/17 10:18 AM  Result Value Ref Range  Uric Acid 6.6 2.5 - 8.0 MG/DL  CBC and Differential  Collection Time: 09/25/17 10:18 AM  Result Value Ref Range  WBC  3.3 (L) 4.8 - 10.8 x 10*3/uL  RBC 4.25 (L) 4.70 - 6.10 x 10*6/uL  Hemoglobin 12.5 (L) 14.0 - 18.0 G/DL  Hematocrit 37.7 (L) 42.0 - 52.0 %  MCV 88.7 80.0 - 94.0 FL  MCH 29.5 27.0 - 31.0 PG  MCHC 33.2 33.0 - 37.0 G/DL  RDW 15.0 (H) 11.5 - 14.5 %  Platelets 65 (L) 160 - 360 X 10*3/uL  MPV 9.0 6.8 - 10.2 FL  Neutrophil % 68 %  Lymphocyte % 16 %  Monocyte % 10 %  Eosinophil % 6 %  Basophil % 1 %  Neutrophil Absolute 2.2 1.6 - 7.3 x 10*3/uL  Lymphocyte Absolute 0.5 (L) 1.0 - 5.1 x 10*3/uL  Monocyte Absolute 0.3 0.0 - 0.5 x 10*3/uL  Eosinophil Absolute 0.2 0.0 - 0.5 x 10*3/uL  Basophil Absolute 0.0 0.0 - 0.2 x 10*3/uL       Physical Exam:    VS:  BP 130/80 (BP Location: Left Arm, Patient Position: Sitting, Cuff Size: Normal)   Pulse (!) 57   Ht 5\' 10"  (1.778 m)   Wt 180 lb (81.6 kg)   SpO2 98%   BMI 25.83 kg/m     Wt Readings from Last 3 Encounters:  10/26/17 180 lb (81.6 kg)  12/02/15 185 lb (83.9 kg)  11/25/15 185 lb 6.5 oz (84.1 kg)     GEN: he looks chronically ill but in no acute distress HEENT: Normal NECK: No JVD;  No carotid bruits LYMPHATICS: No lymphadenopathy CARDIAC: Grade 2/6 to 3/6 murmur outflow aortic area up to the right clavicle and base of the carotids suggesting aortic stenosis S2 is normal he also has an apical murmur 1/6 to 2/6 mitral regurgitation his PMI appears to be displaced laterally RRR,  RESPIRATORY:  Clear to auscultation without rales, wheezing or rhonchi  ABDOMEN: Soft, non-tender, non-distended MUSCULOSKELETAL: He has edema above the umbilicus but no neck vein distention no deformity  SKIN: Multiple scars cutaneous lower extremity from vasculitis and skin biopsies NEUROLOGIC:  Alert and oriented x 3 PSYCHIATRIC:  Normal affect     Signed, Shirlee More, MD  10/26/2017 3:42 PM    [place]

## 2017-10-26 ENCOUNTER — Ambulatory Visit: Payer: Medicare HMO | Admitting: Cardiology

## 2017-10-26 ENCOUNTER — Encounter: Payer: Self-pay | Admitting: Cardiology

## 2017-10-26 VITALS — BP 130/80 | HR 57 | Ht 70.0 in | Wt 180.0 lb

## 2017-10-26 DIAGNOSIS — R609 Edema, unspecified: Secondary | ICD-10-CM

## 2017-10-26 DIAGNOSIS — I25119 Atherosclerotic heart disease of native coronary artery with unspecified angina pectoris: Secondary | ICD-10-CM | POA: Diagnosis not present

## 2017-10-26 DIAGNOSIS — I776 Arteritis, unspecified: Secondary | ICD-10-CM

## 2017-10-26 DIAGNOSIS — C9190 Lymphoid leukemia, unspecified not having achieved remission: Secondary | ICD-10-CM | POA: Diagnosis not present

## 2017-10-26 DIAGNOSIS — C9110 Chronic lymphocytic leukemia of B-cell type not having achieved remission: Secondary | ICD-10-CM

## 2017-10-26 MED ORDER — FUROSEMIDE 20 MG PO TABS: 20.0000 mg | ORAL_TABLET | Freq: Every day | ORAL | 3 refills | Status: DC

## 2017-10-26 NOTE — Patient Instructions (Addendum)
Medication Instructions:  Your physician has recommended you make the following change in your medication:  START furosemide 20 mg daily  Labwork: Your physician recommends that you return for lab work in: BMP, BNP  Testing/Procedures: Your physician has requested that you have an echocardiogram. Echocardiography is a painless test that uses sound waves to create images of your heart. It provides your doctor with information about the size and shape of your heart and how well your heart's chambers and valves are working. This procedure takes approximately one hour. There are no restrictions for this procedure.  Your physician has requested that you have a lower extremity venous duplex. This test is an ultrasound of the veins in the legs. It looks at venous blood flow that carries blood from the heart to the legs. Allow one hour for a Lower Venous exam. There are no restrictions or special instructions.  Follow-Up: Your physician recommends that you schedule a follow-up appointment in: 2 weeks.  Any Other Special Instructions Will Be Listed Below (If Applicable).     If you need a refill on your cardiac medications before your next appointment, please call your pharmacy.    Heart Failure  Weigh yourself every morning when you first wake up and record on a calender or note pad, bring this to your office visits. Using a pill tender can help with taking your medications consistently.  Limit your fluid intake to 2 liters daily  Limit your sodium intake to less than 2-3 grams daily. Ask if you need dietary teaching.  If you gain more than 3 pounds (from your dry weight ), double your dose of diuretic for the day.  If you gain more than 5 pounds (from your dry weight), double your dose of lasix and call your heart failure doctor.  Please do not smoke tobacco since it is very bad for your heart.  Please do not drink alcohol since it can worsen your heart failure.Also avoid OTC  nonsteroidal drugs, such as advil, aleve and motrin.  Try to exercise for at least 30 minutes every day because this will help your heart be more efficient. You may be eligible for supervised cardiac rehab, ask your physician.

## 2017-10-27 LAB — PRO B NATRIURETIC PEPTIDE: NT-Pro BNP: 3638 pg/mL — ABNORMAL HIGH (ref 0–486)

## 2017-10-27 LAB — BASIC METABOLIC PANEL
BUN / CREAT RATIO: 24 (ref 10–24)
BUN: 23 mg/dL (ref 8–27)
CALCIUM: 9 mg/dL (ref 8.6–10.2)
CHLORIDE: 100 mmol/L (ref 96–106)
CO2: 26 mmol/L (ref 20–29)
Creatinine, Ser: 0.97 mg/dL (ref 0.76–1.27)
GFR, EST AFRICAN AMERICAN: 86 mL/min/{1.73_m2} (ref >59)
GFR, EST NON AFRICAN AMERICAN: 74 mL/min/{1.73_m2} (ref >59)
Glucose: 106 mg/dL — ABNORMAL HIGH (ref 65–99)
Potassium: 4.7 mmol/L (ref 3.5–5.2)
SODIUM: 141 mmol/L (ref 134–144)

## 2017-10-31 DIAGNOSIS — G894 Chronic pain syndrome: Secondary | ICD-10-CM | POA: Diagnosis not present

## 2017-10-31 DIAGNOSIS — E038 Other specified hypothyroidism: Secondary | ICD-10-CM | POA: Diagnosis not present

## 2017-10-31 DIAGNOSIS — D6949 Other primary thrombocytopenia: Secondary | ICD-10-CM | POA: Diagnosis not present

## 2017-10-31 DIAGNOSIS — C9111 Chronic lymphocytic leukemia of B-cell type in remission: Secondary | ICD-10-CM | POA: Diagnosis not present

## 2017-10-31 DIAGNOSIS — R600 Localized edema: Secondary | ICD-10-CM | POA: Diagnosis not present

## 2017-10-31 DIAGNOSIS — R69 Illness, unspecified: Secondary | ICD-10-CM | POA: Diagnosis not present

## 2017-10-31 DIAGNOSIS — E782 Mixed hyperlipidemia: Secondary | ICD-10-CM | POA: Diagnosis not present

## 2017-10-31 DIAGNOSIS — M545 Low back pain: Secondary | ICD-10-CM | POA: Diagnosis not present

## 2017-10-31 DIAGNOSIS — I2510 Atherosclerotic heart disease of native coronary artery without angina pectoris: Secondary | ICD-10-CM | POA: Diagnosis not present

## 2017-10-31 DIAGNOSIS — Z6827 Body mass index (BMI) 27.0-27.9, adult: Secondary | ICD-10-CM | POA: Diagnosis not present

## 2017-11-06 ENCOUNTER — Telehealth: Payer: Self-pay | Admitting: Cardiology

## 2017-11-06 NOTE — Telephone Encounter (Signed)
Wife, Mechele Claude, advised to increase patient's potassium intake with food. Advised to eat bananas, cantaloupe, and green vegetables. Advised if no improvement in the next couple of days to return call. Mechele Claude verbalized understanding, no further questions.

## 2017-11-06 NOTE — Telephone Encounter (Signed)
PLEASE CALL REGARDING PATINET TAKING LASIX TWICE A DAY AND NOW DEHYDRATED

## 2017-11-07 DIAGNOSIS — M3210 Systemic lupus erythematosus, organ or system involvement unspecified: Secondary | ICD-10-CM | POA: Diagnosis not present

## 2017-11-07 DIAGNOSIS — L958 Other vasculitis limited to the skin: Secondary | ICD-10-CM | POA: Diagnosis not present

## 2017-11-07 DIAGNOSIS — R531 Weakness: Secondary | ICD-10-CM | POA: Diagnosis not present

## 2017-11-07 DIAGNOSIS — M7989 Other specified soft tissue disorders: Secondary | ICD-10-CM | POA: Diagnosis not present

## 2017-11-13 NOTE — Progress Notes (Signed)
Cardiology Office Note:    Date:  11/14/2017   ID:  Jesse [name], DOB 07/25/1941, MRN [id number]  PCP:  Rochel Brome, MD  Cardiologist:  Shirlee More, MD    Referring MD: Rochel Brome, MD    ASSESSMENT:    1. Localized edema   2. Elevated brain natriuretic peptide (BNP) level   3. Heart failure, type unknown ([place])   4. Chronic lymphocytic leukemia ([place])   5. Coronary artery disease of native artery of native heart with stable angina pectoris ([place])    PLAN:    In order of problems listed above:  1. A normal echocardiogram report in 2016, despite this I think he has congestive heart failure we will continue loop diuretic sodium restriction recheck renal function for potassium elevated creatinine recheck BMP for response to diuretic therapy and a repeat echocardiogram is ordered. 2. Clinically due to heart failure 3. Improved continue loop diuretic await ejection fraction for guideline directed therapy 4. Stable managed by oncology 5. Stable at this time I would not pursue an ischemia evaluation   Next appointment: 6 weeks   Medication Adjustments/Labs and Tests Ordered: Current medicines are reviewed at length with the patient today.  Concerns regarding medicines are outlined above.  No orders of the defined types were placed in this encounter.  No orders of the defined types were placed in this encounter.   Chief Complaint  Patient presents with  . Follow-up    for edema and elevated BNP level    History of Present Illness:    Jesse [name] is a 77 y.o. male with a hx of CAD,CLL and  edema last seen 10/26/17. His BNP was markedly elevated 3,683. Echo in 2016 described normal LV function.  ASSESSMENT:    1. Edema, unspecified type   2. Vasculitis determined by biopsy of skin ([place])   3. Chronic lymphocytic leukemia ([place])    PLAN:    In order of problems listed above 6. He presents with chronic edema that is worsened in the setting of vasculitis gabapentin and  steroid therapy.  This may be all unrelated to his heart but with his weakness exercise intolerance dyspnea murmur on exam is a strong suspect heart failure.  I asked him today to have a BNP level drawn start a low dose of loop diuretic repeat echocardiogram as of a hard time believing that it was normal in 2016 and is at risk for venous thromboembolism d-dimer would likely be significantly elevated nonspecifically vasculitis and do duplexes of the lower extremities.  I asked him to fully sodium restrict and follow-up with me in 2 weeks. 7. Improved but remains on high-dose steroids potentiating edema along with gabapentin managed by his PCP 8. Stable managed by oncology.  I do not think CLL plays a role in his edema.  Compliance with diet, lifestyle and medications: Yes He feels improved home weight is down approximately 7 pounds less edema no shortness of breath and continues to require steroids for his skin vasculitis. Past Medical History:  Diagnosis Date  . Anemia    takes Ferrous Sulfate daily  . Anxiety    takes Xanax daily  . Arthritis   . BPH (benign prostatic hyperplasia) 10/25/2017  . Bradycardia 10/25/2017  . CAD (coronary artery disease) 10/25/2017  . Chest pain 10/25/2017  . Chronic back pain   . Chronic ITP (idiopathic thrombocytopenia) (HCC) 08/08/2013  . Chronic lymphocytic leukemia ([place]) 10/25/2017  . Constipation    takes Colace daily as needed  .  Dry eyes    eye drops as needed  . Dyspnea on exertion 10/25/2017  . History of blood transfusion    no abnormal reaction noted  . History of kidney stones   . Hyperlipemia 10/25/2017  . Hyperlipidemia    takes Pravastatin daily  . Infusion reaction 4 yrs ago   platelet infusion broke out head to toe  . Insomnia    takes trazodone nightly as needed  . Leukemia ([place])   . Low back pain 12/12/2012  . Lumbar radiculopathy 01/09/2013  . Muscle spasm    takes Robaxin daily as needed  . Nerve pain    takes Gabapentin daily   . Nocturia    . Renal stones 10/25/2017  . Status post lumbar surgery 12/02/2015  . Thrombocytopenia, secondary 10/25/2017   Overview:  secondary to CLL  . Vitamin B12 deficiency    takes Vit B12 daily  . Weakness    numbness and tingling    Past Surgical History:  Procedure Laterality Date  . ANTERIOR LAT LUMBAR FUSION N/A 12/02/2015   Procedure: L3-4 ANTERIOR LATERAL LUMBAR FUSION w/lateral plate;  Surgeon: Eustace Moore, MD;  Location: [facility];  Service: Neurosurgery;  Laterality: N/A;  L3-4 ANTERIOR LATERAL LUMBAR FUSION w/lateral plate  . BACK SURGERY  2012   fusion x 2  . CATARACT EXTRACTION    . CERVICAL FUSION  1988   x 2  . COLONOSCOPY    . FOOT SURGERY    . HERNIA REPAIR Left    inguinal  . KNEE SURGERY    . LAMINECTOMY    . plates/screws/rods removed  2015  . TOE SURGERY    . TRANSURETHRAL RESECTION OF PROSTATE      Current Medications: Current Meds  Medication Sig  . ALPRAZolam (XANAX) 0.5 MG tablet Take 0.5 mg by mouth 3 (three) times daily.  Marland Kitchen b complex vitamins tablet Take by mouth.  . furosemide (LASIX) 20 MG tablet Take 1 tablet (20 mg total) by mouth daily. (Patient taking differently: Take 20 mg by mouth 2 (two) times daily. )  . gabapentin (NEURONTIN) 600 MG tablet Take 600 mg by mouth 3 (three) times daily.  . Homeopathic Products ([employer] LEG CRAMPS PAIN RELIEF PO) Take 1-2 tablets by mouth at bedtime as needed.  . methocarbamol (ROBAXIN) 750 MG tablet Take 750 mg by mouth every 6 (six) hours as needed for muscle spasms.  Marland Kitchen oxyCODONE-acetaminophen (PERCOCET) 10-325 MG tablet Take 1 tablet by mouth every 6 (six) hours as needed for pain.  . pravastatin (PRAVACHOL) 80 MG tablet Take 80 mg by mouth daily.  . predniSONE (DELTASONE) 20 MG tablet Take by mouth daily.   . Probiotic Product (PROBIOTIC DAILY PO) Take 1 capsule by mouth daily.  . Tetrahydrozoline-PEG (EYE DROPS EXTRA OP) Apply 1-2 drops to eye as needed.  . traZODone (DESYREL) 50 MG tablet Take 75 mg by mouth  at bedtime as needed for sleep.  . vitamin B-12 (CYANOCOBALAMIN) 1000 MCG tablet Take 1,000 mcg by mouth daily.  . vitamin C (ASCORBIC ACID) 500 MG tablet Take by mouth.     Allergies:   Patient has no known allergies.   Social History   Socioeconomic History  . Marital status: Married    Spouse name: None  . Number of children: None  . Years of education: None  . Highest education level: None  Social Needs  . Financial resource strain: None  . Food insecurity - worry: None  . Food  insecurity - inability: None  . Transportation needs - medical: None  . Transportation needs - non-medical: None  Occupational History  . None  Tobacco Use  . Smoking status: Never Smoker  . Smokeless tobacco: Never Used  Substance and Sexual Activity  . Alcohol use: No  . Drug use: No  . Sexual activity: Not Currently  Other Topics Concern  . None  Social History Narrative  . None     Family History: The patient's family history includes CAD in his brother and father; Congestive Heart Failure in his brother; Kidney failure in his brother; Leukemia in his brother; Prostate cancer in his brother. ROS:   Please see the history of present illness.    All other systems reviewed and are negative.  EKGs/Labs/Other Studies Reviewed:    The following studies were reviewed today: Despite  Recent Labs: 10/26/2017: BUN 23; Creatinine, Ser 0.97; NT-Pro BNP 3,638; Potassium 4.7; Sodium 141  Recent Lipid Panel No results found for: CHOL, TRIG, HDL, CHOLHDL, VLDL, LDLCALC, LDLDIRECT  Physical Exam:    VS:  BP 118/70 (BP Location: Right Arm, Patient Position: Sitting, Cuff Size: Normal)   Pulse 65   Ht 5\' 10"  (1.778 m)   Wt 177 lb (80.3 kg)   SpO2 98%   BMI 25.40 kg/m     Wt Readings from Last 3 Encounters:  11/14/17 177 lb (80.3 kg)  10/26/17 180 lb (81.6 kg)  12/02/15 185 lb (83.9 kg)     GEN:  Well nourished, well developed in no acute distress HEENT: Normal NECK: No JVD; No carotid  bruits LYMPHATICS: No lymphadenopathy CARDIAC: RRR, no murmurs, rubs, gallops RESPIRATORY:  Clear to auscultation without rales, wheezing or rhonchi  ABDOMEN: Soft, non-tender, non-distended MUSCULOSKELETAL:  3-4+ pitting lower extremity  bilateraledema; No deformity  SKIN: Warm and dry NEUROLOGIC:  Alert and oriented x 3 PSYCHIATRIC:  Normal affect    Signed, Shirlee More, MD  11/14/2017 10:39 AM    [place]

## 2017-11-14 ENCOUNTER — Ambulatory Visit: Payer: Medicare HMO | Admitting: Cardiology

## 2017-11-14 ENCOUNTER — Encounter: Payer: Self-pay | Admitting: Cardiology

## 2017-11-14 VITALS — BP 118/70 | HR 65 | Ht 70.0 in | Wt 177.0 lb

## 2017-11-14 DIAGNOSIS — R7989 Other specified abnormal findings of blood chemistry: Secondary | ICD-10-CM | POA: Diagnosis not present

## 2017-11-14 DIAGNOSIS — C9190 Lymphoid leukemia, unspecified not having achieved remission: Secondary | ICD-10-CM | POA: Diagnosis not present

## 2017-11-14 DIAGNOSIS — C9110 Chronic lymphocytic leukemia of B-cell type not having achieved remission: Secondary | ICD-10-CM

## 2017-11-14 DIAGNOSIS — R609 Edema, unspecified: Secondary | ICD-10-CM | POA: Insufficient documentation

## 2017-11-14 DIAGNOSIS — R600 Localized edema: Secondary | ICD-10-CM

## 2017-11-14 DIAGNOSIS — I509 Heart failure, unspecified: Secondary | ICD-10-CM

## 2017-11-14 DIAGNOSIS — I25118 Atherosclerotic heart disease of native coronary artery with other forms of angina pectoris: Secondary | ICD-10-CM | POA: Diagnosis not present

## 2017-11-14 DIAGNOSIS — I5032 Chronic diastolic (congestive) heart failure: Secondary | ICD-10-CM | POA: Insufficient documentation

## 2017-11-14 DIAGNOSIS — L958 Other vasculitis limited to the skin: Secondary | ICD-10-CM | POA: Diagnosis not present

## 2017-11-14 MED ORDER — FUROSEMIDE 40 MG PO TABS: 40.0000 mg | ORAL_TABLET | Freq: Every day | ORAL | 3 refills | Status: DC

## 2017-11-14 NOTE — Patient Instructions (Addendum)
Medication Instructions:  Your physician has recommended you make the following change in your medication:  CHANGE furosemide to 40 mg once daily  Labwork: Your physician recommends that you return for lab work in: today. BMP, BNP  Testing/Procedures: None  Follow-Up: Your physician recommends that you schedule a follow-up appointment in: 4 weeks.  Any Other Special Instructions Will Be Listed Below (If Applicable).     If you need a refill on your cardiac medications before your next appointment, please call your pharmacy.    Heart Failure  Weigh yourself every morning when you first wake up and record on a calender or note pad, bring this to your office visits. Using a pill tender can help with taking your medications consistently.  Limit your fluid intake to 2 liters daily  Limit your sodium intake to less than 2-3 grams daily. Ask if you need dietary teaching.  If you gain more than 3 pounds (from your dry weight ), double your dose of diuretic for the day.  If you gain more than 5 pounds (from your dry weight), double your dose of lasix and call your heart failure doctor.  Please do not smoke tobacco since it is very bad for your heart.  Please do not drink alcohol since it can worsen your heart failure.Also avoid OTC nonsteroidal drugs, such as advil, aleve and motrin.  Try to exercise for at least 30 minutes every day because this will help your heart be more efficient. You may be eligible for supervised cardiac rehab, ask your physician.

## 2017-11-15 LAB — BASIC METABOLIC PANEL
BUN/Creatinine Ratio: 22 (ref 10–24)
BUN: 21 mg/dL (ref 8–27)
CO2: 27 mmol/L (ref 20–29)
Calcium: 9 mg/dL (ref 8.6–10.2)
Chloride: 100 mmol/L (ref 96–106)
Creatinine, Ser: 0.97 mg/dL (ref 0.76–1.27)
GFR calc Af Amer: 86 mL/min/{1.73_m2} (ref >59)
GFR calc non Af Amer: 74 mL/min/{1.73_m2} (ref >59)
Glucose: 95 mg/dL (ref 65–99)
Potassium: 4.3 mmol/L (ref 3.5–5.2)
Sodium: 141 mmol/L (ref 134–144)

## 2017-11-15 LAB — PRO B NATRIURETIC PEPTIDE: NT-PRO BNP: 1344 pg/mL — AB (ref 0–486)

## 2017-11-20 ENCOUNTER — Ambulatory Visit (HOSPITAL_BASED_OUTPATIENT_CLINIC_OR_DEPARTMENT_OTHER)
Admission: RE | Admit: 2017-11-20 | Discharge: 2017-11-20 | Disposition: A | Discharge status: Home / Self Care | Payer: Medicare HMO | Source: Ambulatory Visit | Attending: Cardiology | Admitting: Cardiology

## 2017-11-20 DIAGNOSIS — I77810 Thoracic aortic ectasia: Secondary | ICD-10-CM | POA: Insufficient documentation

## 2017-11-20 DIAGNOSIS — R079 Chest pain, unspecified: Secondary | ICD-10-CM | POA: Insufficient documentation

## 2017-11-20 DIAGNOSIS — I083 Combined rheumatic disorders of mitral, aortic and tricuspid valves: Secondary | ICD-10-CM | POA: Diagnosis not present

## 2017-11-20 DIAGNOSIS — R609 Edema, unspecified: Secondary | ICD-10-CM | POA: Insufficient documentation

## 2017-11-20 LAB — ECHOCARDIOGRAM COMPLETE
AO mean calculated velocity dopler: 184 cm/s
AOVTI: 58 cm
AV VEL mean LVOT/AV: 0.49
AV pk vel: 255 cm/s
AV vel: 2.52
AVAREAMEANV: 2.2 cm2
AVAREAMEANVIN: 1.1 cm2/m2
AVAREAVTI: 2.38 cm2
AVAREAVTIIND: 1.26 cm2/m2
AVG: 15 mmHg
AVPG: 26 mmHg
Ao pk vel: 0.53 m/s
Ao-asc: 40 cm
CHL CUP AV PEAK INDEX: 1.19
CHL CUP AV VALUE AREA INDEX: 1.26
CHL CUP DOP CALC LVOT VTI: 32.4 cm
CHL CUP MV DEC (S): 208
CHL CUP REG VEL DIAS: 119 cm/s
CHL CUP TV REG PEAK VELOCITY: 277 cm/s
E/e' ratio: 14.35
EWDT: 208 ms
FS: 24 % — AB (ref 28–44)
IV/PV OW: 1.11
LA diam end sys: 48 mm
LA diam index: 2.4 cm/m2
LA vol index: 46.5 mL/m2
LA vol: 93 mL
LASIZE: 48 mm
LAVOLA4C: 80.9 mL
LV E/e'average: 14.35
LV PW d: 12.9 mm — AB (ref 0.6–1.1)
LV e' LATERAL: 7.18 cm/s
LVEEMED: 14.35
LVOT area: 4.52 cm2
LVOT diameter: 24 mm
LVOT peak grad rest: 7 mmHg
LVOTPV: 134 cm/s
LVOTSV: 146 mL
LVOTVTI: 0.56 cm
Lateral S' vel: 13.4 cm/s
MV Peak grad: 4 mmHg
MV pk E vel: 103 m/s
MVPKAVEL: 78.1 m/s
PV Reg grad dias: 6 mmHg
RV TAPSE: 39.1 mm
RV sys press: 22 mmHg
TDI e' lateral: 7.18
TDI e' medial: 4.9
TRMAXVEL: 277 cm/s
Valve area: 2.52 cm2

## 2017-11-20 NOTE — Progress Notes (Signed)
Echocardiogram 2D Echocardiogram has been performed.  Jesse [name] 11/20/2017, 12:03 PM

## 2017-11-20 NOTE — Progress Notes (Signed)
   Bilateral venous duplex performed. No DVT seen. [place] 11/20/2017, 11:57 AM

## 2017-11-22 DIAGNOSIS — L958 Other vasculitis limited to the skin: Secondary | ICD-10-CM | POA: Diagnosis not present

## 2017-11-25 DIAGNOSIS — L958 Other vasculitis limited to the skin: Secondary | ICD-10-CM | POA: Diagnosis not present

## 2017-11-27 IMAGING — MR MR LUMBAR SPINE W/O CM
5 series · 45 of 48 positions shown · non-contrast
Comparison: MRI lumbar spine 03/02/2015. Lumbar radiographs
01/02/2017

CLINICAL DATA: Failed back syndrome.

EXAM:
MRI LUMBAR SPINE WITHOUT CONTRAST
TECHNIQUE: Multiplanar, multisequence MR imaging of the lumbar spine was
performed. No intravenous contrast was administered.

[Series 3: tirm sag · sagittal · 4.0mm · 0.55mm/px · 6 of 13 slices shown]
[im 1/13]
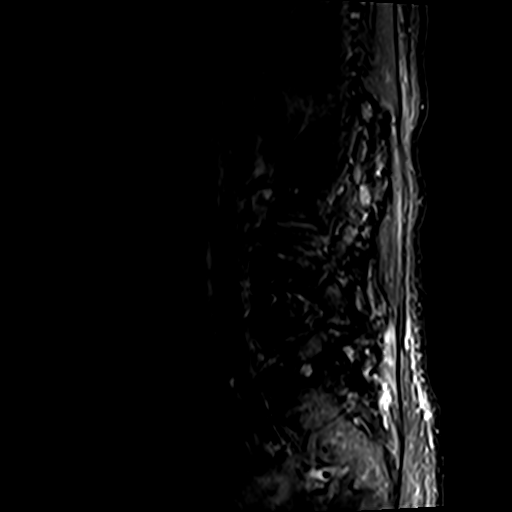
[im 3/13]
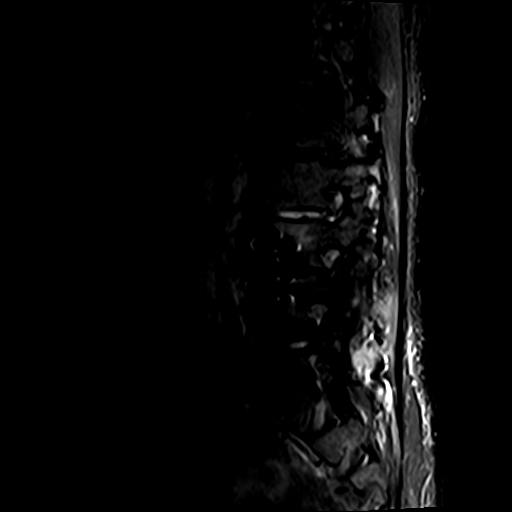
[im 5/13]
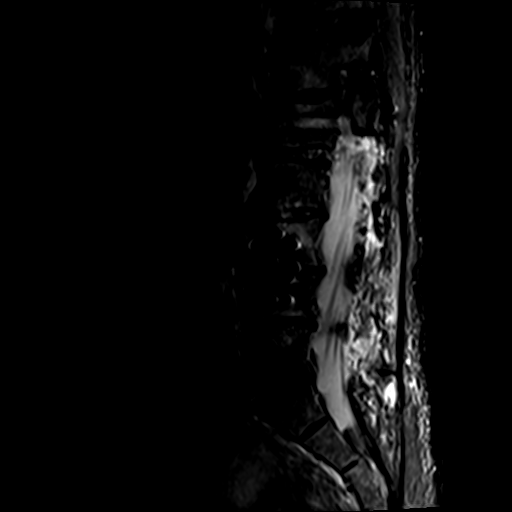
[im 8/13]
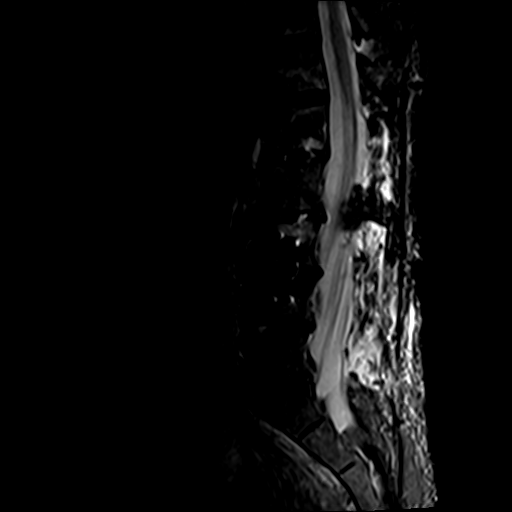
[im 10/13]
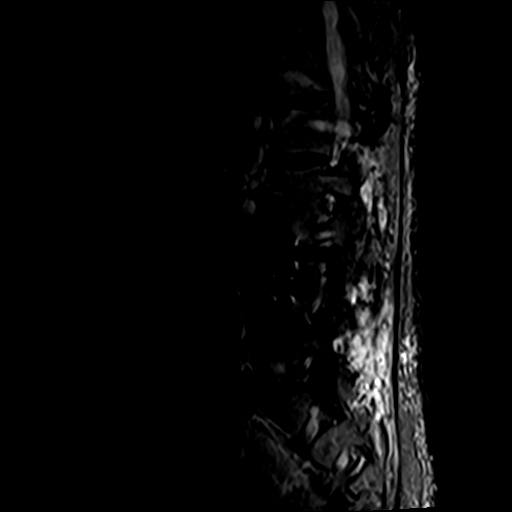
[im 13/13]
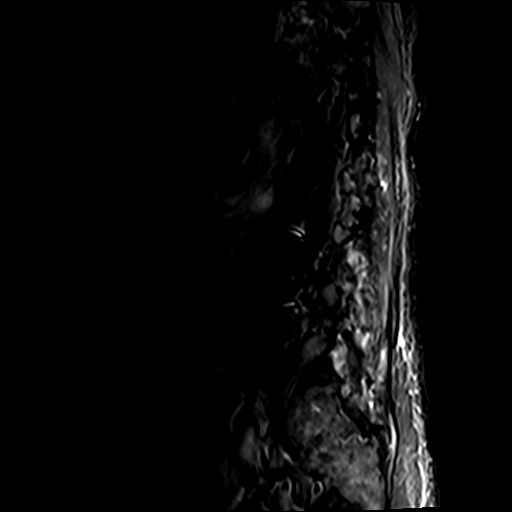

[Series 4: T2 · sagittal · 4.0mm · 0.88mm/px · 6 of 13 slices shown (1 of 2)]
[im 1/13]
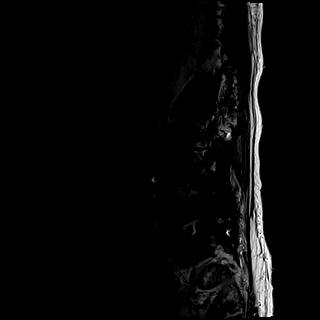
[im 3/13]
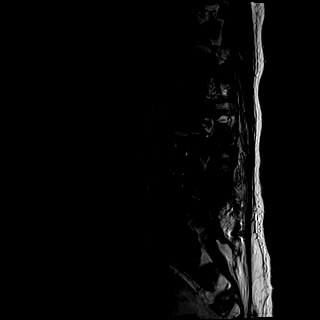
[im 5/13]
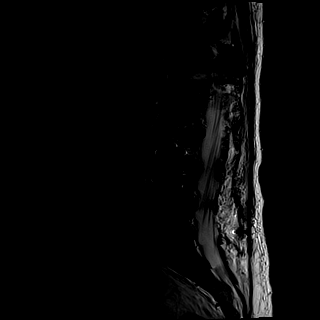
[im 8/13]
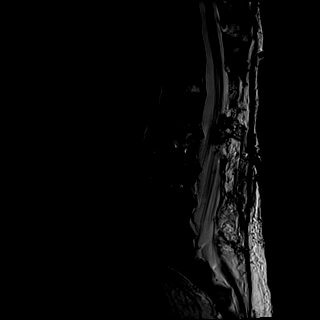
[im 10/13]
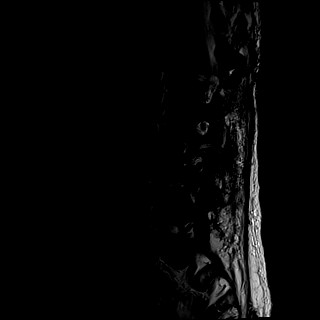
[im 13/13]
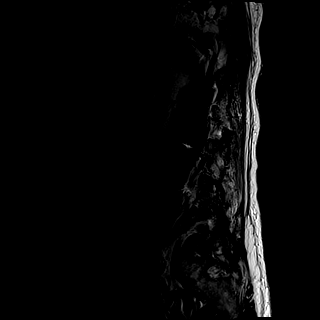

[Series 5: T1 · sagittal · 4.0mm · 0.88mm/px · 6 of 13 slices shown (1 of 2)]
[im 1/13]
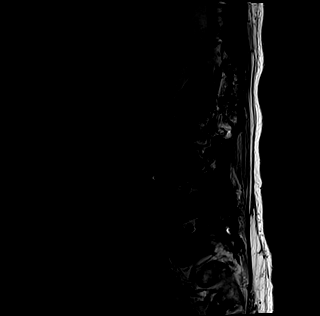
[im 3/13]
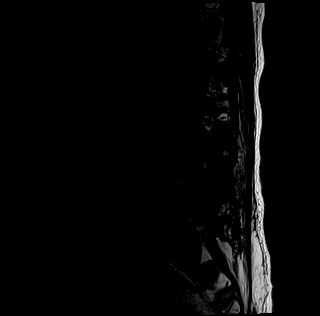
[im 5/13]
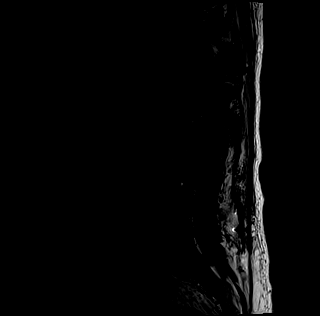
[im 8/13]
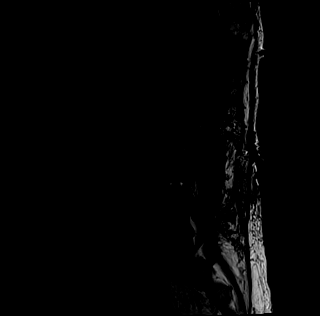
[im 10/13]
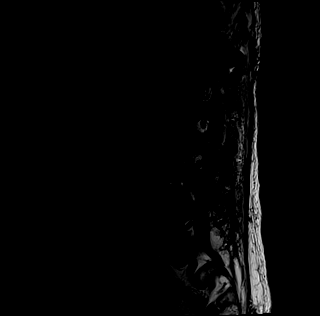
[im 13/13]
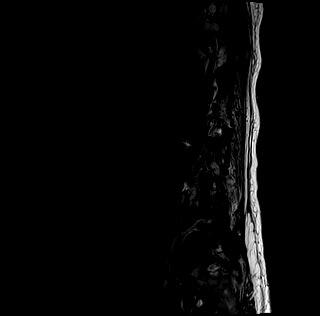

[Series 6: T1 · axial · 4.0mm · 0.78mm/px · z∈[-67,+91]mm · 12 of 31 slices shown (2 of 2)]
[im 1/31]
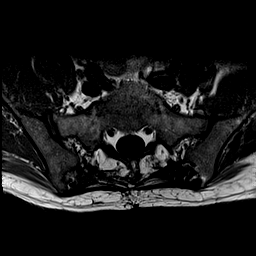
[im 3/31]
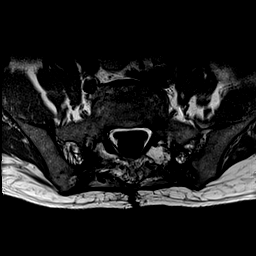
[im 5/31]
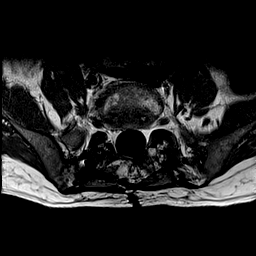
[im 7/31]
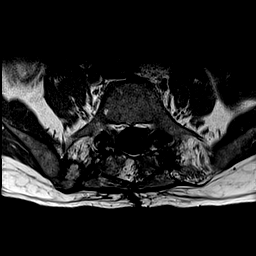
[im 9/31]
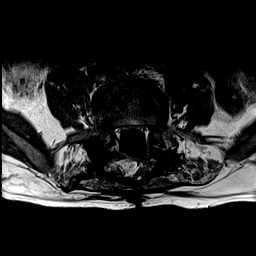
[im 11/31]
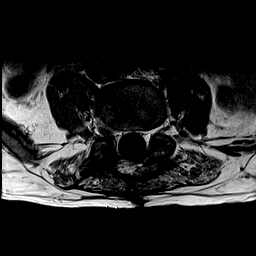
[im 13/31]
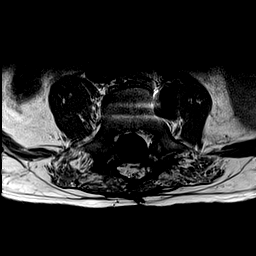
[im 16/31]
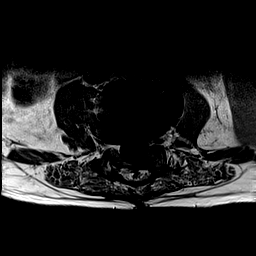
[im 18/31]
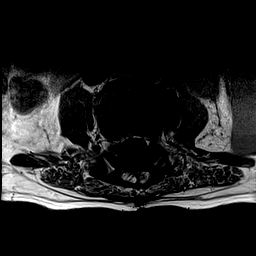
[im 22/31]
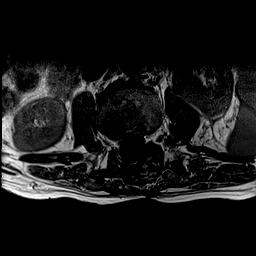
[im 26/31]
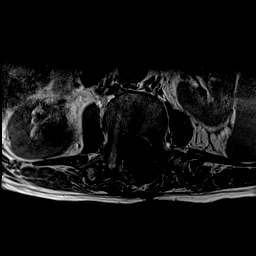
[im 31/31]
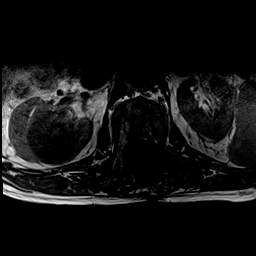

[Series 7: T2 · axial · 4.0mm · 0.78mm/px · z∈[-67,+91]mm · 15 of 31 slices shown (2 of 2)]
[im 1/31]
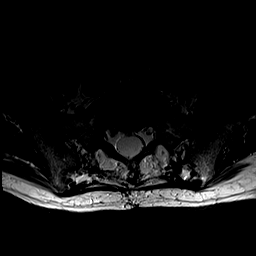
[im 3/31]
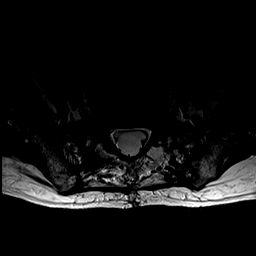
[im 5/31]
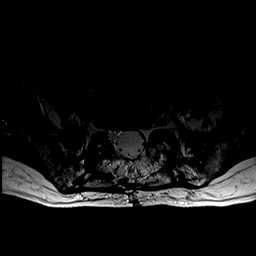
[im 7/31]
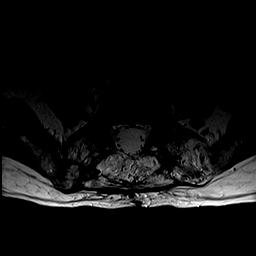
[im 9/31]
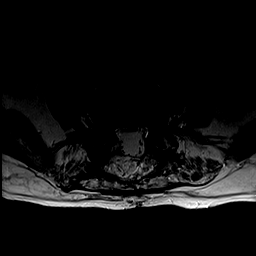
[im 11/31]
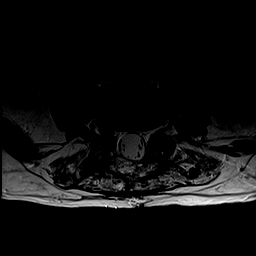
[im 13/31]
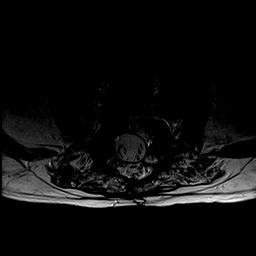
[im 16/31]
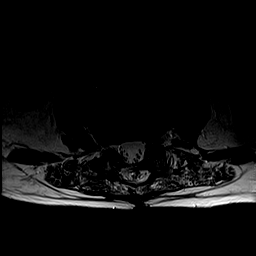
[im 18/31]
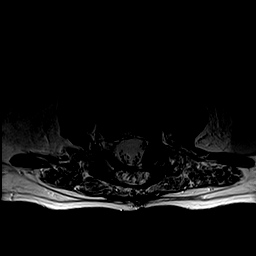
[im 20/31]
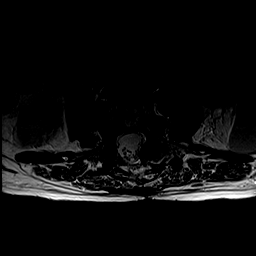
[im 22/31]
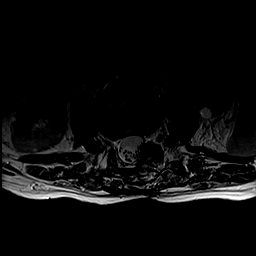
[im 24/31]
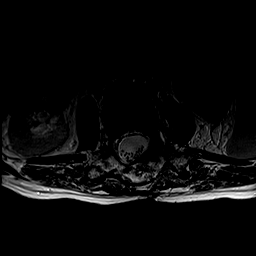
[im 26/31]
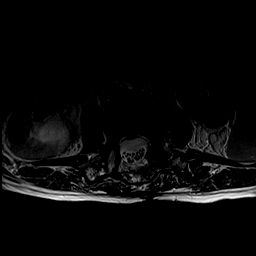
[im 28/31]
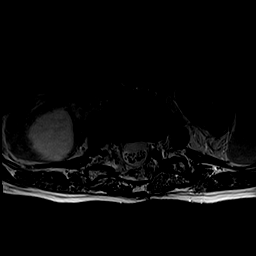
[im 31/31]
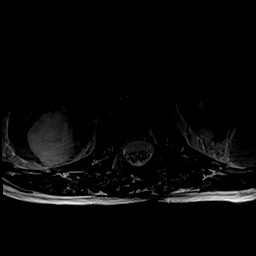

[45 of 48 positions shown; findings below may reference images not displayed]

FINDINGS: Segmentation:  Normal

Alignment: Mild retrolisthesis L4-5, unchanged. Mild anterolisthesis
L3-4.

Vertebrae: Negative for fracture or mass. Pedicle screw tracks are
noted bilaterally L1 through S1 with prior hardware removed.
Interval fusion L1-2 and L2-3. Negative for fracture or mass

Conus medullaris: Extends to the T12-L1 Level and appears normal.

Paraspinal and other soft tissues: 4 cm right renal cyst. No soft
tissue mass or fluid collection.

Disc levels:

L1-2:  Bilateral laminectomy.  Interbody fusion without stenosis

L2-3: Decompressive laminectomy. Extensive disc degeneration without
stenosis

L3-4: Left lateral plate and screw vertebral body fusion. Posterior
decompression without spinal stenosis

L4-5: Mild retrolisthesis. Bilateral laminectomy. Mild foraminal
narrowing bilaterally due to spurring.

L5-S1: Posterior decompression. Extensive disc degeneration and
spurring without significant stenosis.
IMPRESSION: Interbody fusion L1-2 and L3-4 without stenosis.

Bilateral laminectomy L2 through S1 without significant spinal
stenosis.

## 2017-11-28 DIAGNOSIS — L958 Other vasculitis limited to the skin: Secondary | ICD-10-CM | POA: Diagnosis not present

## 2017-11-30 DIAGNOSIS — Z5181 Encounter for therapeutic drug level monitoring: Secondary | ICD-10-CM | POA: Diagnosis not present

## 2017-11-30 DIAGNOSIS — C9190 Lymphoid leukemia, unspecified not having achieved remission: Secondary | ICD-10-CM | POA: Diagnosis not present

## 2017-11-30 DIAGNOSIS — M310 Hypersensitivity angiitis: Secondary | ICD-10-CM | POA: Diagnosis not present

## 2017-11-30 DIAGNOSIS — Z79899 Other long term (current) drug therapy: Secondary | ICD-10-CM | POA: Diagnosis not present

## 2017-11-30 DIAGNOSIS — C9110 Chronic lymphocytic leukemia of B-cell type not having achieved remission: Secondary | ICD-10-CM | POA: Diagnosis not present

## 2017-12-11 DIAGNOSIS — Z79899 Other long term (current) drug therapy: Secondary | ICD-10-CM | POA: Diagnosis not present

## 2017-12-12 ENCOUNTER — Ambulatory Visit: Payer: Medicare HMO | Admitting: Cardiology

## 2017-12-12 ENCOUNTER — Encounter: Payer: Self-pay | Admitting: Cardiology

## 2017-12-12 VITALS — BP 118/68 | HR 71 | Ht 70.0 in | Wt 172.0 lb

## 2017-12-12 DIAGNOSIS — I5032 Chronic diastolic (congestive) heart failure: Secondary | ICD-10-CM | POA: Diagnosis not present

## 2017-12-12 DIAGNOSIS — I350 Nonrheumatic aortic (valve) stenosis: Secondary | ICD-10-CM | POA: Insufficient documentation

## 2017-12-12 DIAGNOSIS — R7989 Other specified abnormal findings of blood chemistry: Secondary | ICD-10-CM | POA: Diagnosis not present

## 2017-12-12 MED ORDER — FUROSEMIDE 40 MG PO TABS: 20.0000 mg | ORAL_TABLET | Freq: Every day | ORAL | 3 refills | Status: DC

## 2017-12-12 NOTE — Progress Notes (Signed)
Cardiology Office Note:    Date:  12/12/2017   ID:  Jesse [name], DOB 08-31-1941, MRN [id number]  PCP:  Rochel Brome, MD  Cardiologist:  Shirlee More, MD    Referring MD: Rochel Brome, MD    ASSESSMENT:    1. Chronic diastolic heart failure (HCC)   2. Elevated brain natriuretic peptide (BNP) level   3. Mild aortic stenosis    PLAN:    In order of problems listed above:  1. Improved presently New York Heart Association class I no shortness of breath has no edema and reduce his diuretic to 20 mg/day increasing of his weight goes up 3 pounds.  He will continue sodium restriction. 2. His clinical picture is consistent with heart failure has responded to diuretic therapy and is compensators 3. Stable mild he will need a repeat echocardiogram in approximately 2 years   Next appointment: 6 months   Medication Adjustments/Labs and Tests Ordered: Current medicines are reviewed at length with the patient today.  Concerns regarding medicines are outlined above.  No orders of the defined types were placed in this encounter.  Meds ordered this encounter  Medications  . furosemide (LASIX) 40 MG tablet    Sig: Take 0.5 tablets (20 mg total) by mouth daily. Increase to 40 mg if weight is up 3 lbs from your baseline    Dispense:  90 tablet    Refill:  3    Chief Complaint  Patient presents with  . Follow-up    after echo and vascular studies  . Congestive Heart Failure    History of Present Illness:    Jesse [name] is a 77 y.o. male with a hx of CLL cutaneous vasculitis edema and elevated BNP level last seen last month. Compliance with diet, lifestyle and medications: Yes He is markedly improved he is resolved his shortness of breath and edema and echocardiogram shows a preserved EF and mild aortic stenosis.  Duplex lower extremity shows no evidence of deep vein thrombophlebitis.  Labs are follow-up with his PCP I requested a copy of his last renal profile Past Medical History:    Diagnosis Date  . Anemia    takes Ferrous Sulfate daily  . Anxiety    takes Xanax daily  . Arthritis   . BPH (benign prostatic hyperplasia) 10/25/2017  . Bradycardia 10/25/2017  . CAD (coronary artery disease) 10/25/2017  . Chest pain 10/25/2017  . Chronic back pain   . Chronic ITP (idiopathic thrombocytopenia) (HCC) 08/08/2013  . Chronic lymphocytic leukemia ([place]) 10/25/2017  . Constipation    takes Colace daily as needed  . Dry eyes    eye drops as needed  . Dyspnea on exertion 10/25/2017  . History of blood transfusion    no abnormal reaction noted  . History of kidney stones   . Hyperlipemia 10/25/2017  . Hyperlipidemia    takes Pravastatin daily  . Infusion reaction 4 yrs ago   platelet infusion broke out head to toe  . Insomnia    takes trazodone nightly as needed  . Leukemia ([place])   . Low back pain 12/12/2012  . Lumbar radiculopathy 01/09/2013  . Muscle spasm    takes Robaxin daily as needed  . Nerve pain    takes Gabapentin daily   . Nocturia   . Renal stones 10/25/2017  . Status post lumbar surgery 12/02/2015  . Thrombocytopenia, secondary 10/25/2017   Overview:  secondary to CLL  . Vitamin B12 deficiency    takes Vit B12 daily  .  Weakness    numbness and tingling    Past Surgical History:  Procedure Laterality Date  . ANTERIOR LAT LUMBAR FUSION N/A 12/02/2015   Procedure: L3-4 ANTERIOR LATERAL LUMBAR FUSION w/lateral plate;  Surgeon: Eustace Moore, MD;  Location: [facility];  Service: Neurosurgery;  Laterality: N/A;  L3-4 ANTERIOR LATERAL LUMBAR FUSION w/lateral plate  . BACK SURGERY  2012   fusion x 2  . CATARACT EXTRACTION    . CERVICAL FUSION  1988   x 2  . COLONOSCOPY    . FOOT SURGERY    . HERNIA REPAIR Left    inguinal  . KNEE SURGERY    . LAMINECTOMY    . plates/screws/rods removed  2015  . TOE SURGERY    . TRANSURETHRAL RESECTION OF PROSTATE      Current Medications: Current Meds  Medication Sig  . ALPRAZolam (XANAX) 0.5 MG tablet Take 0.5 mg by  mouth 3 (three) times daily.  Marland Kitchen b complex vitamins tablet Take 1 tablet by mouth daily.   Marland Kitchen FOLIC ACID PO Take 1 tablet by mouth daily.  . furosemide (LASIX) 40 MG tablet Take 0.5 tablets (20 mg total) by mouth daily. Increase to 40 mg if weight is up 3 lbs from your baseline  . gabapentin (NEURONTIN) 600 MG tablet Take 600 mg by mouth 3 (three) times daily.  . Homeopathic Products ([employer] LEG CRAMPS PAIN RELIEF PO) Take 1-2 tablets by mouth at bedtime as needed.  . methocarbamol (ROBAXIN) 750 MG tablet Take 750 mg by mouth every 6 (six) hours as needed for muscle spasms.  . Methotrexate, Anti-Rheumatic, (METHOTREXATE, PF, [place]) Inject 0.6 mLs into the skin once a week.  Marland Kitchen oxyCODONE-acetaminophen (PERCOCET) 10-325 MG tablet Take 1 tablet by mouth every 6 (six) hours as needed for pain.  . pravastatin (PRAVACHOL) 80 MG tablet Take 80 mg by mouth daily.  . predniSONE (DELTASONE) 20 MG tablet Take 20 mg by mouth daily.   . Probiotic Product (PROBIOTIC DAILY PO) Take 1 capsule by mouth daily.  . Tetrahydrozoline-PEG (EYE DROPS EXTRA OP) Apply 1-2 drops to eye as needed.  . traZODone (DESYREL) 50 MG tablet Take 100 mg by mouth at bedtime as needed for sleep.   . vitamin B-12 (CYANOCOBALAMIN) 1000 MCG tablet Take 1,000 mcg by mouth daily.  . vitamin C (ASCORBIC ACID) 500 MG tablet Take 500 mg by mouth daily.   . [DISCONTINUED] furosemide (LASIX) 40 MG tablet Take 1 tablet (40 mg total) by mouth daily.     Allergies:   Patient has no known allergies.   Social History   Socioeconomic History  . Marital status: Married    Spouse name: Not on file  . Number of children: Not on file  . Years of education: Not on file  . Highest education level: Not on file  Occupational History  . Not on file  Social Needs  . Financial resource strain: Not on file  . Food insecurity:    Worry: Not on file    Inability: Not on file  . Transportation needs:    Medical: Not on file    Non-medical: Not on file    Tobacco Use  . Smoking status: Never Smoker  . Smokeless tobacco: Never Used  Substance and Sexual Activity  . Alcohol use: No  . Drug use: No  . Sexual activity: Not Currently  Lifestyle  . Physical activity:    Days per week: Not on file    Minutes per  session: Not on file  . Stress: Not on file  Relationships  . Social connections:    Talks on phone: Not on file    Gets together: Not on file    Attends religious service: Not on file    Active member of club or organization: Not on file    Attends meetings of clubs or organizations: Not on file    Relationship status: Not on file  Other Topics Concern  . Not on file  Social History Narrative  . Not on file     Family History: The patient's family history includes CAD in his brother and father; Congestive Heart Failure in his brother; Kidney failure in his brother; Leukemia in his brother; Prostate cancer in his brother; Throat cancer in his mother. ROS:   Please see the history of present illness.    All other systems reviewed and are negative.  EKGs/Labs/Other Studies Reviewed:    The following studies were reviewed today:  Echo 11/20/17:Study Conclusions  - Left ventricle: The cavity size was normal. Wall thickness was   increased in a pattern of mild LVH. Systolic function was normal.   The estimated ejection fraction was in the range of 55% to 60%.   Wall motion was normal; there were no regional wall motion   abnormalities. Features are consistent with a pseudonormal left   ventricular filling pattern, with concomitant abnormal relaxation   and increased filling pressure (grade 2 diastolic dysfunction).   Doppler parameters are consistent with high ventricular filling   pressure. - Aortic valve: There was mild stenosis. Valve area (VTI): 2.52   cm^2. Valve area (Vmax): 2.38 cm^2. Valve area (Vmean): 2.2 cm^2. - Mitral valve: There was mild regurgitation. - Left atrium: The atrium was mildly dilated. -  Right atrium: The atrium was mildly dilated.  Lower extremity vein duplex 11/20/17 Final Interpretation: Right: No evidence of deep vein thrombosis in the lower extremity. No indirect evidence of obstruction proximal to the inguinal ligament. Left: No evidence of deep vein thrombosis in the lower extremity. No indirect evidence of obstruction proximal to the inguinal ligament.  Recent Labs: 11/14/2017: BUN 21; Creatinine, Ser 0.97; NT-Pro BNP 1,344; Potassium 4.3; Sodium 141  Recent Lipid Panel No results found for: CHOL, TRIG, HDL, CHOLHDL, VLDL, LDLCALC, LDLDIRECT  Physical Exam:    VS:  BP 118/68 (BP Location: Right Arm, Patient Position: Sitting, Cuff Size: Normal)   Pulse 71   Ht 5\' 10"  (1.778 m)   Wt 172 lb (78 kg)   SpO2 96%   BMI 24.68 kg/m     Wt Readings from Last 3 Encounters:  12/12/17 172 lb (78 kg)  11/14/17 177 lb (80.3 kg)  10/26/17 180 lb (81.6 kg)     GEN:  Well nourished, well developed in no acute distress HEENT: Normal NECK: No JVD; No carotid bruits LYMPHATICS: No lymphadenopathy CARDIAC: RRR, no murmurs, rubs, gallops RESPIRATORY:  Clear to auscultation without rales, wheezing or rhonchi  ABDOMEN: Soft, non-tender, non-distended MUSCULOSKELETAL:  No edema; No deformity  SKIN: Warm and dry NEUROLOGIC:  Alert and oriented x 3 PSYCHIATRIC:  Normal affect    Signed, Shirlee More, MD  12/12/2017 12:54 PM    [place]

## 2017-12-12 NOTE — Patient Instructions (Signed)
Medication Instructions:  Your physician has recommended you make the following change in your medication:  DECREASE furosemide to 20 mg daily.  Labwork: None  Testing/Procedures: None  Follow-Up: Your physician recommends that you schedule a follow-up appointment in: 3 months.  Any Other Special Instructions Will Be Listed Below (If Applicable).     If you need a refill on your cardiac medications before your next appointment, please call your pharmacy.

## 2017-12-15 ENCOUNTER — Telehealth: Payer: Self-pay

## 2017-12-15 DIAGNOSIS — J209 Acute bronchitis, unspecified: Secondary | ICD-10-CM | POA: Diagnosis not present

## 2017-12-15 NOTE — Telephone Encounter (Signed)
Patient's wife called in stating that her husband has been complaining of leg pain, chest discomfort while trying to take a deep breath, and shortness of breath. Per the wife the patient has not gained excessive acutely. The patient stated that he did not wish to go to the ED. Upon reviewing the patient's chart it was recommended that the patient at least seek treatment from urgent care. The wife and patient were agreeable; wife was given the office fax number for any records to be sent.

## 2017-12-19 DIAGNOSIS — R531 Weakness: Secondary | ICD-10-CM | POA: Diagnosis not present

## 2017-12-19 DIAGNOSIS — L299 Pruritus, unspecified: Secondary | ICD-10-CM | POA: Diagnosis not present

## 2017-12-19 DIAGNOSIS — B009 Herpesviral infection, unspecified: Secondary | ICD-10-CM | POA: Diagnosis not present

## 2017-12-19 DIAGNOSIS — L0100 Impetigo, unspecified: Secondary | ICD-10-CM | POA: Diagnosis not present

## 2017-12-19 DIAGNOSIS — D485 Neoplasm of uncertain behavior of skin: Secondary | ICD-10-CM | POA: Diagnosis not present

## 2017-12-21 DIAGNOSIS — L989 Disorder of the skin and subcutaneous tissue, unspecified: Secondary | ICD-10-CM | POA: Diagnosis not present

## 2017-12-21 DIAGNOSIS — M310 Hypersensitivity angiitis: Secondary | ICD-10-CM | POA: Diagnosis not present

## 2017-12-25 DIAGNOSIS — D693 Immune thrombocytopenic purpura: Secondary | ICD-10-CM | POA: Diagnosis not present

## 2017-12-25 DIAGNOSIS — C9190 Lymphoid leukemia, unspecified not having achieved remission: Secondary | ICD-10-CM | POA: Diagnosis not present

## 2017-12-27 DIAGNOSIS — A419 Sepsis, unspecified organism: Secondary | ICD-10-CM | POA: Diagnosis not present

## 2017-12-27 DIAGNOSIS — R000 Tachycardia, unspecified: Secondary | ICD-10-CM | POA: Diagnosis not present

## 2017-12-27 DIAGNOSIS — M545 Low back pain: Secondary | ICD-10-CM | POA: Diagnosis not present

## 2017-12-27 DIAGNOSIS — R0603 Acute respiratory distress: Secondary | ICD-10-CM | POA: Diagnosis not present

## 2017-12-27 DIAGNOSIS — R4182 Altered mental status, unspecified: Secondary | ICD-10-CM | POA: Diagnosis not present

## 2017-12-27 DIAGNOSIS — G9341 Metabolic encephalopathy: Secondary | ICD-10-CM | POA: Diagnosis not present

## 2017-12-27 DIAGNOSIS — G8929 Other chronic pain: Secondary | ICD-10-CM | POA: Diagnosis not present

## 2017-12-27 DIAGNOSIS — C9111 Chronic lymphocytic leukemia of B-cell type in remission: Secondary | ICD-10-CM | POA: Diagnosis not present

## 2017-12-27 DIAGNOSIS — J181 Lobar pneumonia, unspecified organism: Secondary | ICD-10-CM | POA: Diagnosis not present

## 2017-12-27 DIAGNOSIS — J189 Pneumonia, unspecified organism: Secondary | ICD-10-CM | POA: Diagnosis not present

## 2017-12-27 DIAGNOSIS — R079 Chest pain, unspecified: Secondary | ICD-10-CM | POA: Diagnosis not present

## 2017-12-27 DIAGNOSIS — I776 Arteritis, unspecified: Secondary | ICD-10-CM | POA: Diagnosis not present

## 2017-12-27 DIAGNOSIS — R69 Illness, unspecified: Secondary | ICD-10-CM | POA: Diagnosis not present

## 2017-12-27 DIAGNOSIS — G629 Polyneuropathy, unspecified: Secondary | ICD-10-CM | POA: Diagnosis not present

## 2017-12-27 DIAGNOSIS — J9690 Respiratory failure, unspecified, unspecified whether with hypoxia or hypercapnia: Secondary | ICD-10-CM | POA: Diagnosis not present

## 2017-12-27 DIAGNOSIS — R0902 Hypoxemia: Secondary | ICD-10-CM | POA: Diagnosis not present

## 2017-12-28 DIAGNOSIS — R4182 Altered mental status, unspecified: Secondary | ICD-10-CM | POA: Diagnosis not present

## 2017-12-28 DIAGNOSIS — R0902 Hypoxemia: Secondary | ICD-10-CM | POA: Diagnosis not present

## 2017-12-28 DIAGNOSIS — R0603 Acute respiratory distress: Secondary | ICD-10-CM | POA: Diagnosis not present

## 2017-12-28 DIAGNOSIS — G8929 Other chronic pain: Secondary | ICD-10-CM | POA: Diagnosis not present

## 2017-12-28 DIAGNOSIS — G9341 Metabolic encephalopathy: Secondary | ICD-10-CM | POA: Diagnosis not present

## 2017-12-28 DIAGNOSIS — J189 Pneumonia, unspecified organism: Secondary | ICD-10-CM | POA: Diagnosis not present

## 2017-12-28 DIAGNOSIS — C9111 Chronic lymphocytic leukemia of B-cell type in remission: Secondary | ICD-10-CM | POA: Diagnosis not present

## 2017-12-29 DIAGNOSIS — R0603 Acute respiratory distress: Secondary | ICD-10-CM | POA: Diagnosis not present

## 2017-12-29 DIAGNOSIS — J189 Pneumonia, unspecified organism: Secondary | ICD-10-CM | POA: Diagnosis not present

## 2017-12-29 DIAGNOSIS — G9341 Metabolic encephalopathy: Secondary | ICD-10-CM | POA: Diagnosis not present

## 2017-12-29 DIAGNOSIS — C9111 Chronic lymphocytic leukemia of B-cell type in remission: Secondary | ICD-10-CM | POA: Diagnosis not present

## 2017-12-29 DIAGNOSIS — G8929 Other chronic pain: Secondary | ICD-10-CM | POA: Diagnosis not present

## 2017-12-29 DIAGNOSIS — R4182 Altered mental status, unspecified: Secondary | ICD-10-CM | POA: Diagnosis not present

## 2017-12-29 DIAGNOSIS — R0902 Hypoxemia: Secondary | ICD-10-CM | POA: Diagnosis not present

## 2017-12-29 DIAGNOSIS — J9690 Respiratory failure, unspecified, unspecified whether with hypoxia or hypercapnia: Secondary | ICD-10-CM | POA: Diagnosis not present

## 2017-12-30 DIAGNOSIS — R0902 Hypoxemia: Secondary | ICD-10-CM | POA: Diagnosis not present

## 2017-12-30 DIAGNOSIS — J189 Pneumonia, unspecified organism: Secondary | ICD-10-CM | POA: Diagnosis not present

## 2017-12-30 DIAGNOSIS — R4182 Altered mental status, unspecified: Secondary | ICD-10-CM | POA: Diagnosis not present

## 2017-12-30 DIAGNOSIS — G8929 Other chronic pain: Secondary | ICD-10-CM | POA: Diagnosis not present

## 2017-12-30 DIAGNOSIS — C9111 Chronic lymphocytic leukemia of B-cell type in remission: Secondary | ICD-10-CM | POA: Diagnosis not present

## 2017-12-30 DIAGNOSIS — R0603 Acute respiratory distress: Secondary | ICD-10-CM | POA: Diagnosis not present

## 2017-12-30 DIAGNOSIS — G9341 Metabolic encephalopathy: Secondary | ICD-10-CM | POA: Diagnosis not present

## 2017-12-31 DIAGNOSIS — G9341 Metabolic encephalopathy: Secondary | ICD-10-CM | POA: Diagnosis not present

## 2017-12-31 DIAGNOSIS — J189 Pneumonia, unspecified organism: Secondary | ICD-10-CM | POA: Diagnosis not present

## 2017-12-31 DIAGNOSIS — R0902 Hypoxemia: Secondary | ICD-10-CM | POA: Diagnosis not present

## 2017-12-31 DIAGNOSIS — C9111 Chronic lymphocytic leukemia of B-cell type in remission: Secondary | ICD-10-CM | POA: Diagnosis not present

## 2017-12-31 DIAGNOSIS — G8929 Other chronic pain: Secondary | ICD-10-CM | POA: Diagnosis not present

## 2017-12-31 DIAGNOSIS — R0603 Acute respiratory distress: Secondary | ICD-10-CM | POA: Diagnosis not present

## 2017-12-31 DIAGNOSIS — R4182 Altered mental status, unspecified: Secondary | ICD-10-CM | POA: Diagnosis not present

## 2018-01-10 DIAGNOSIS — J9601 Acute respiratory failure with hypoxia: Secondary | ICD-10-CM | POA: Diagnosis not present

## 2018-01-10 DIAGNOSIS — J158 Pneumonia due to other specified bacteria: Secondary | ICD-10-CM | POA: Diagnosis not present

## 2018-01-15 DIAGNOSIS — H52223 Regular astigmatism, bilateral: Secondary | ICD-10-CM | POA: Diagnosis not present

## 2018-01-17 DIAGNOSIS — J019 Acute sinusitis, unspecified: Secondary | ICD-10-CM | POA: Diagnosis not present

## 2018-01-17 DIAGNOSIS — R040 Epistaxis: Secondary | ICD-10-CM | POA: Diagnosis not present

## 2018-01-23 DIAGNOSIS — K429 Umbilical hernia without obstruction or gangrene: Secondary | ICD-10-CM | POA: Diagnosis not present

## 2018-01-23 DIAGNOSIS — R1033 Periumbilical pain: Secondary | ICD-10-CM | POA: Insufficient documentation

## 2018-01-29 DIAGNOSIS — J189 Pneumonia, unspecified organism: Secondary | ICD-10-CM | POA: Diagnosis not present

## 2018-01-29 DIAGNOSIS — J188 Other pneumonia, unspecified organism: Secondary | ICD-10-CM | POA: Diagnosis not present

## 2018-01-30 DIAGNOSIS — Z6827 Body mass index (BMI) 27.0-27.9, adult: Secondary | ICD-10-CM | POA: Diagnosis not present

## 2018-01-30 DIAGNOSIS — E785 Hyperlipidemia, unspecified: Secondary | ICD-10-CM | POA: Diagnosis not present

## 2018-01-30 DIAGNOSIS — E038 Other specified hypothyroidism: Secondary | ICD-10-CM | POA: Diagnosis not present

## 2018-01-30 DIAGNOSIS — I2510 Atherosclerotic heart disease of native coronary artery without angina pectoris: Secondary | ICD-10-CM | POA: Diagnosis not present

## 2018-01-30 DIAGNOSIS — R69 Illness, unspecified: Secondary | ICD-10-CM | POA: Diagnosis not present

## 2018-01-30 DIAGNOSIS — R531 Weakness: Secondary | ICD-10-CM | POA: Diagnosis not present

## 2018-01-30 DIAGNOSIS — D6949 Other primary thrombocytopenia: Secondary | ICD-10-CM | POA: Diagnosis not present

## 2018-01-30 DIAGNOSIS — M545 Low back pain: Secondary | ICD-10-CM | POA: Diagnosis not present

## 2018-01-30 DIAGNOSIS — E782 Mixed hyperlipidemia: Secondary | ICD-10-CM | POA: Diagnosis not present

## 2018-01-30 DIAGNOSIS — C9111 Chronic lymphocytic leukemia of B-cell type in remission: Secondary | ICD-10-CM | POA: Diagnosis not present

## 2018-02-04 NOTE — Progress Notes (Signed)
Cardiology Office Note:    Date:  02/05/2018   ID:  Jesse [name], DOB [birth year], MRN [id number]  PCP:  Rochel Brome, MD  Cardiologist:  Shirlee More, MD    Referring MD: Rochel Brome, MD    ASSESSMENT:    1. Preoperative cardiovascular examination   2. Chronic diastolic heart failure ([place])   3. Coronary artery disease involving native coronary artery of native heart without angina pectoris   4. Coronary artery disease of native artery of native heart with stable angina pectoris ([place])    PLAN:    In order of problems listed above:  Preoperative cardiovascular evaluation summary  Surgeon: Margarette Canada, MD  Procedure: umbilical hernia repair with mesh The surgery is elective Active cardiac problems chronic diastolic heart failure mild aortic stenosis.  The cardiac status is stable. The planned procedure is intermediate risk. The functional capacity is 4 mets or greater yes Recent cardiac tests performed EKG in my office recent labs CT of chest [facility] April 2019 Given the above his overall risk for the planned procedure is low/acceptable Antiplatelet/ anticoagulant recommendation: None Other cardiac medication or device recommendation: Continue usual cardiac meds anticipated outpatient surgery Anesthesia recommendation: None Observation, monitoring,and postoperative test recommendation: None anticipated outpatient surgery The patient is optimized from a cardiology perspective: Yes  1. See above he is optimized for his planned surgical procedure 2. Stable compensated heart failure continue his current diuretic 3. Stable continue medical treatment he does not require an ischemia evaluation prior to surgery   Next appointment: 6 months   Medication Adjustments/Labs and Tests Ordered: Current medicines are reviewed at length with the patient today.  Concerns regarding medicines are outlined above.  Orders Placed This Encounter  Procedures  . EKG  12-Lead   No orders of the defined types were placed in this encounter.   Chief Complaint  Patient presents with  . Pre-op Exam    prior to hernia surgery  . Congestive Heart Failure    History of Present Illness:    Jesse [name] is a 77 y.o. male with a hx of heart failure last seen 12/12/17. He is referred for preoperative cardiology evaluation by Margarette Canada, MD    ASSESSMENT:    12/12/17   1. Chronic diastolic heart failure (HCC)   2. Elevated brain natriuretic peptide (BNP) level   3. Mild aortic stenosis    PLAN:    In order of problems listed above:  1.    Improved presently New York Heart Association class I no shortness of breath has no edema and reduce his diuretic to 20 mg/day increasing of his weight goes up 3 pounds.  He will continue sodium restriction. 4. His clinical picture is consistent with heart failure has responded to diuretic therapy and is compensators 5. Stable mild he will need a repeat echocardiogram in approximately 2 years  Compliance with diet, lifestyle and medications: yes  He was admitted to [facility] 12/27/2017 at that time he had fever hypoxia and pneumonia.  He is treated with broad-spectrum antibiotics was improved over the next 48 hours CT of the chest showed no evidence of pulmonary embolism while in hospital he is felt to have mildly decompensated heart failure and received IV diuretic with improvement.  Studies performed on admission include sterile blood cultures CBC showed a hemoglobin of 10.2 white count 3200 GFR 59 cc creatinine 1.2 potassium 3.8 BNP level 1030 chest x-ray unremarkable CTA of chest showed no pulmonary embolism consolidation of the  lung bases compatible with pneumonia EKG showed sinus rhythm LVH repolarization  He has improved weight is stable at home he has very little residual edema no shortness of breath orthopnea chest pain palpitation or syncope he has an umbilical hernia with intermittent  pain and advised to have repair with mesh.  Despite chronic back pain he can still climb a flight of stairs exercise tolerance greater than 4 METS.  Past Medical History:  Diagnosis Date  . Anemia    takes Ferrous Sulfate daily  . Anxiety    takes Xanax daily  . Arthritis   . BPH (benign prostatic hyperplasia) 10/25/2017  . Bradycardia 10/25/2017  . CAD (coronary artery disease) 10/25/2017  . Chest pain 10/25/2017  . Chronic back pain   . Chronic ITP (idiopathic thrombocytopenia) (HCC) 08/08/2013  . Chronic lymphocytic leukemia ([place]) 10/25/2017  . Constipation    takes Colace daily as needed  . Dry eyes    eye drops as needed  . Dyspnea on exertion 10/25/2017  . History of blood transfusion    no abnormal reaction noted  . History of kidney stones   . Hyperlipemia 10/25/2017  . Hyperlipidemia    takes Pravastatin daily  . Infusion reaction 4 yrs ago   platelet infusion broke out head to toe  . Insomnia    takes trazodone nightly as needed  . Leukemia ([place])   . Low back pain 12/12/2012  . Lumbar radiculopathy 01/09/2013  . Muscle spasm    takes Robaxin daily as needed  . Nerve pain    takes Gabapentin daily   . Nocturia   . Renal stones 10/25/2017  . Status post lumbar surgery 12/02/2015  . Thrombocytopenia, secondary 10/25/2017   Overview:  secondary to CLL  . Vitamin B12 deficiency    takes Vit B12 daily  . Weakness    numbness and tingling    Past Surgical History:  Procedure Laterality Date  . ANTERIOR LAT LUMBAR FUSION N/A 12/02/2015   Procedure: L3-4 ANTERIOR LATERAL LUMBAR FUSION w/lateral plate;  Surgeon: Eustace Moore, MD;  Location: [place] [facility];  Service: Neurosurgery;  Laterality: N/A;  L3-4 ANTERIOR LATERAL LUMBAR FUSION w/lateral plate  . BACK SURGERY  2012   fusion x 2  . CATARACT EXTRACTION    . CERVICAL FUSION  1988   x 2  . COLONOSCOPY    . FOOT SURGERY    . HERNIA REPAIR Left    inguinal  . KNEE SURGERY    . LAMINECTOMY    . plates/screws/rods removed  2015   . TOE SURGERY    . TRANSURETHRAL RESECTION OF PROSTATE      Current Medications: Current Meds  Medication Sig  . ALPRAZolam (XANAX) 0.5 MG tablet Take 0.5 mg by mouth 3 (three) times daily.  Marland Kitchen b complex vitamins tablet Take 1 tablet by mouth daily.   Marland Kitchen FOLIC ACID PO Take 1 tablet by mouth daily.  . furosemide (LASIX) 40 MG tablet Take 0.5 tablets (20 mg total) by mouth daily. Increase to 40 mg if weight is up 3 lbs from your baseline  . gabapentin (NEURONTIN) 600 MG tablet Take 600 mg by mouth 3 (three) times daily.  . Homeopathic Products ([employer] LEG CRAMPS PAIN RELIEF PO) Take 1-2 tablets by mouth at bedtime as needed.  Marland Kitchen levothyroxine (SYNTHROID, LEVOTHROID) 88 MCG tablet Take 88 mcg by mouth daily before breakfast.  . methocarbamol (ROBAXIN) 750 MG tablet Take 750 mg by mouth every 6 (six) hours as  needed for muscle spasms.  . Methotrexate, Anti-Rheumatic, (METHOTREXATE, PF, [place]) Inject 0.6 mLs into the skin once a week.  Marland Kitchen oxyCODONE-acetaminophen (PERCOCET) 10-325 MG tablet Take 1 tablet by mouth every 6 (six) hours as needed for pain.  . pravastatin (PRAVACHOL) 80 MG tablet Take 80 mg by mouth daily.  . Probiotic Product (PROBIOTIC DAILY PO) Take 1 capsule by mouth daily.  . Tetrahydrozoline-PEG (EYE DROPS EXTRA OP) Apply 1-2 drops to eye as needed.  . traZODone (DESYREL) 50 MG tablet Take 100 mg by mouth at bedtime as needed for sleep.   . vitamin B-12 (CYANOCOBALAMIN) 1000 MCG tablet Take 1,000 mcg by mouth daily.  . vitamin C (ASCORBIC ACID) 500 MG tablet Take 500 mg by mouth daily.      Allergies:   Patient has no known allergies.   Social History   Socioeconomic History  . Marital status: Married    Spouse name: Not on file  . Number of children: Not on file  . Years of education: Not on file  . Highest education level: Not on file  Occupational History  . Not on file  Social Needs  . Financial resource strain: Not on file  . Food insecurity:    Worry: Not on file     Inability: Not on file  . Transportation needs:    Medical: Not on file    Non-medical: Not on file  Tobacco Use  . Smoking status: Never Smoker  . Smokeless tobacco: Never Used  Substance and Sexual Activity  . Alcohol use: No  . Drug use: No  . Sexual activity: Not Currently  Lifestyle  . Physical activity:    Days per week: Not on file    Minutes per session: Not on file  . Stress: Not on file  Relationships  . Social connections:    Talks on phone: Not on file    Gets together: Not on file    Attends religious service: Not on file    Active member of club or organization: Not on file    Attends meetings of clubs or organizations: Not on file    Relationship status: Not on file  Other Topics Concern  . Not on file  Social History Narrative  . Not on file     Family History: The patient's family history includes CAD in his brother and father; Congestive Heart Failure in his brother; Kidney failure in his brother; Leukemia in his brother; Prostate cancer in his brother; Throat cancer in his mother. ROS:   Please see the history of present illness.    All other systems reviewed and are negative.  EKGs/Labs/Other Studies Reviewed:    The following studies were reviewed today:  EKG:  EKG ordered today.  The ekg ordered today demonstrates sinus rhythm consider old septal infarction LVH unchanged from previous  Recent Labs: 11/14/2017: BUN 21; Creatinine, Ser 0.97; NT-Pro BNP 1,344; Potassium 4.3; Sodium 141  Recent Lipid Panel No results found for: CHOL, TRIG, HDL, CHOLHDL, VLDL, LDLCALC, LDLDIRECT  Physical Exam:    VS:  BP 116/64 (BP Location: Right Arm, Patient Position: Sitting, Cuff Size: Normal)   Pulse 61   Ht 5\' 10"  (1.778 m)   Wt 178 lb (80.7 kg)   SpO2 98%   BMI 25.54 kg/m     Wt Readings from Last 3 Encounters:  02/05/18 178 lb (80.7 kg)  12/12/17 172 lb (78 kg)  11/14/17 177 lb (80.3 kg)     GEN:  Well  nourished, well developed in no acute  distress HEENT: Normal NECK: No JVD; No carotid bruits LYMPHATICS: No lymphadenopathy CARDIAC: RRR, no murmurs, rubs, gallops RESPIRATORY:  Clear to auscultation without rales, wheezing or rhonchi  ABDOMEN: Soft, non-tender, non-distended MUSCULOSKELETAL:  No edema; No deformity  SKIN: Warm and dry NEUROLOGIC:  Alert and oriented x 3 PSYCHIATRIC:  Normal affect    Signed, Shirlee More, MD  02/05/2018 11:52 AM    [place]

## 2018-02-05 ENCOUNTER — Ambulatory Visit: Payer: Medicare HMO | Admitting: Cardiology

## 2018-02-05 ENCOUNTER — Encounter: Payer: Self-pay | Admitting: Cardiology

## 2018-02-05 VITALS — BP 116/64 | HR 61 | Ht 70.0 in | Wt 178.0 lb

## 2018-02-05 DIAGNOSIS — I5032 Chronic diastolic (congestive) heart failure: Secondary | ICD-10-CM | POA: Diagnosis not present

## 2018-02-05 DIAGNOSIS — I25118 Atherosclerotic heart disease of native coronary artery with other forms of angina pectoris: Secondary | ICD-10-CM

## 2018-02-05 DIAGNOSIS — I2510 Atherosclerotic heart disease of native coronary artery without angina pectoris: Secondary | ICD-10-CM

## 2018-02-05 DIAGNOSIS — Z01810 Encounter for preprocedural cardiovascular examination: Secondary | ICD-10-CM

## 2018-02-05 NOTE — Patient Instructions (Addendum)
Medication Instructions:  Your physician recommends that you continue on your current medications as directed. Please refer to the Current Medication list given to you today.   Labwork: None  Testing/Procedures: You had an EKG today.  Follow-Up: Your physician wants you to follow-up in: 6 months. You will receive a reminder letter in the mail two months in advance. If you don't receive a letter, please call our office to schedule the follow-up appointment.   Any Other Special Instructions Will Be Listed Below (If Applicable).     If you need a refill on your cardiac medications before your next appointment, please call your pharmacy.    Heart Failure  Weigh yourself every morning when you first wake up and record on a calender or note pad, bring this to your office visits. Using a pill tender can help with taking your medications consistently.  Limit your fluid intake to 2 liters daily  Limit your sodium intake to less than 2-3 grams daily. Ask if you need dietary teaching.  If you gain more than 3 pounds (from your dry weight ), double your dose of diuretic for the day.  If you gain more than 5 pounds (from your dry weight), double your dose of lasix and call your heart failure doctor.  Please do not smoke tobacco since it is very bad for your heart.  Please do not drink alcohol since it can worsen your heart failure.Also avoid OTC nonsteroidal drugs, such as advil, aleve and motrin.  Try to exercise for at least 30 minutes every day because this will help your heart be more efficient. You may be eligible for supervised cardiac rehab, ask your physician.     

## 2018-02-06 DIAGNOSIS — M5416 Radiculopathy, lumbar region: Secondary | ICD-10-CM | POA: Diagnosis not present

## 2018-02-08 DIAGNOSIS — D485 Neoplasm of uncertain behavior of skin: Secondary | ICD-10-CM | POA: Diagnosis not present

## 2018-02-08 DIAGNOSIS — L958 Other vasculitis limited to the skin: Secondary | ICD-10-CM | POA: Diagnosis not present

## 2018-02-08 DIAGNOSIS — L300 Nummular dermatitis: Secondary | ICD-10-CM | POA: Diagnosis not present

## 2018-02-13 ENCOUNTER — Other Ambulatory Visit: Payer: Self-pay | Admitting: Student

## 2018-02-13 DIAGNOSIS — M5416 Radiculopathy, lumbar region: Secondary | ICD-10-CM

## 2018-02-16 DIAGNOSIS — N400 Enlarged prostate without lower urinary tract symptoms: Secondary | ICD-10-CM | POA: Diagnosis not present

## 2018-02-16 DIAGNOSIS — R69 Illness, unspecified: Secondary | ICD-10-CM | POA: Diagnosis not present

## 2018-02-16 DIAGNOSIS — E785 Hyperlipidemia, unspecified: Secondary | ICD-10-CM | POA: Diagnosis not present

## 2018-02-16 DIAGNOSIS — G4700 Insomnia, unspecified: Secondary | ICD-10-CM | POA: Diagnosis not present

## 2018-02-16 DIAGNOSIS — M549 Dorsalgia, unspecified: Secondary | ICD-10-CM | POA: Diagnosis not present

## 2018-02-16 DIAGNOSIS — E538 Deficiency of other specified B group vitamins: Secondary | ICD-10-CM | POA: Diagnosis not present

## 2018-02-16 DIAGNOSIS — M1990 Unspecified osteoarthritis, unspecified site: Secondary | ICD-10-CM | POA: Diagnosis not present

## 2018-02-16 DIAGNOSIS — I350 Nonrheumatic aortic (valve) stenosis: Secondary | ICD-10-CM | POA: Diagnosis not present

## 2018-02-16 DIAGNOSIS — K429 Umbilical hernia without obstruction or gangrene: Secondary | ICD-10-CM | POA: Diagnosis not present

## 2018-02-16 DIAGNOSIS — K420 Umbilical hernia with obstruction, without gangrene: Secondary | ICD-10-CM | POA: Diagnosis not present

## 2018-02-16 DIAGNOSIS — I5032 Chronic diastolic (congestive) heart failure: Secondary | ICD-10-CM | POA: Diagnosis not present

## 2018-02-16 DIAGNOSIS — I2510 Atherosclerotic heart disease of native coronary artery without angina pectoris: Secondary | ICD-10-CM | POA: Diagnosis not present

## 2018-02-21 DIAGNOSIS — R531 Weakness: Secondary | ICD-10-CM | POA: Diagnosis not present

## 2018-02-22 ENCOUNTER — Other Ambulatory Visit: Payer: Medicare HMO

## 2018-02-27 DIAGNOSIS — Z09 Encounter for follow-up examination after completed treatment for conditions other than malignant neoplasm: Secondary | ICD-10-CM | POA: Insufficient documentation

## 2018-03-03 DIAGNOSIS — C44519 Basal cell carcinoma of skin of other part of trunk: Secondary | ICD-10-CM | POA: Diagnosis not present

## 2018-04-21 DIAGNOSIS — L299 Pruritus, unspecified: Secondary | ICD-10-CM | POA: Diagnosis not present

## 2018-04-26 DIAGNOSIS — R69 Illness, unspecified: Secondary | ICD-10-CM | POA: Diagnosis not present

## 2018-04-30 DIAGNOSIS — M545 Low back pain: Secondary | ICD-10-CM | POA: Diagnosis not present

## 2018-04-30 DIAGNOSIS — C9110 Chronic lymphocytic leukemia of B-cell type not having achieved remission: Secondary | ICD-10-CM | POA: Diagnosis not present

## 2018-04-30 DIAGNOSIS — L958 Other vasculitis limited to the skin: Secondary | ICD-10-CM | POA: Diagnosis not present

## 2018-04-30 DIAGNOSIS — D693 Immune thrombocytopenic purpura: Secondary | ICD-10-CM | POA: Diagnosis not present

## 2018-04-30 DIAGNOSIS — C9190 Lymphoid leukemia, unspecified not having achieved remission: Secondary | ICD-10-CM | POA: Diagnosis not present

## 2018-04-30 DIAGNOSIS — Z9889 Other specified postprocedural states: Secondary | ICD-10-CM | POA: Diagnosis not present

## 2018-05-03 DIAGNOSIS — E038 Other specified hypothyroidism: Secondary | ICD-10-CM | POA: Diagnosis not present

## 2018-05-03 DIAGNOSIS — E782 Mixed hyperlipidemia: Secondary | ICD-10-CM | POA: Diagnosis not present

## 2018-05-03 DIAGNOSIS — C9111 Chronic lymphocytic leukemia of B-cell type in remission: Secondary | ICD-10-CM | POA: Diagnosis not present

## 2018-05-07 DIAGNOSIS — Z6826 Body mass index (BMI) 26.0-26.9, adult: Secondary | ICD-10-CM | POA: Diagnosis not present

## 2018-05-07 DIAGNOSIS — D6949 Other primary thrombocytopenia: Secondary | ICD-10-CM | POA: Diagnosis not present

## 2018-05-07 DIAGNOSIS — E038 Other specified hypothyroidism: Secondary | ICD-10-CM | POA: Diagnosis not present

## 2018-05-07 DIAGNOSIS — I2510 Atherosclerotic heart disease of native coronary artery without angina pectoris: Secondary | ICD-10-CM | POA: Diagnosis not present

## 2018-05-07 DIAGNOSIS — E782 Mixed hyperlipidemia: Secondary | ICD-10-CM | POA: Diagnosis not present

## 2018-05-07 DIAGNOSIS — E663 Overweight: Secondary | ICD-10-CM | POA: Diagnosis not present

## 2018-05-07 DIAGNOSIS — C9190 Lymphoid leukemia, unspecified not having achieved remission: Secondary | ICD-10-CM | POA: Diagnosis not present

## 2018-05-07 DIAGNOSIS — R531 Weakness: Secondary | ICD-10-CM | POA: Diagnosis not present

## 2018-05-07 DIAGNOSIS — M545 Low back pain: Secondary | ICD-10-CM | POA: Diagnosis not present

## 2018-05-07 DIAGNOSIS — R69 Illness, unspecified: Secondary | ICD-10-CM | POA: Diagnosis not present

## 2018-05-28 ENCOUNTER — Other Ambulatory Visit: Payer: Self-pay

## 2018-05-28 ENCOUNTER — Emergency Department (HOSPITAL_COMMUNITY): Payer: Medicare HMO

## 2018-05-28 ENCOUNTER — Emergency Department (HOSPITAL_COMMUNITY)
Admission: EM | Admit: 2018-05-28 | Discharge: 2018-05-29 | Disposition: A | Discharge status: Home / Self Care | Payer: Medicare HMO | Attending: Emergency Medicine | Admitting: Emergency Medicine

## 2018-05-28 ENCOUNTER — Encounter (HOSPITAL_COMMUNITY): Payer: Self-pay | Admitting: Emergency Medicine

## 2018-05-28 ENCOUNTER — Telehealth: Payer: Self-pay

## 2018-05-28 DIAGNOSIS — R001 Bradycardia, unspecified: Secondary | ICD-10-CM | POA: Insufficient documentation

## 2018-05-28 DIAGNOSIS — R079 Chest pain, unspecified: Secondary | ICD-10-CM | POA: Diagnosis not present

## 2018-05-28 DIAGNOSIS — Z5321 Procedure and treatment not carried out due to patient leaving prior to being seen by health care provider: Secondary | ICD-10-CM | POA: Insufficient documentation

## 2018-05-28 LAB — CBC
HCT: 36.4 % — ABNORMAL LOW (ref 39.0–52.0)
HEMOGLOBIN: 11.7 g/dL — AB (ref 13.0–17.0)
MCH: 30.2 pg (ref 26.0–34.0)
MCHC: 32.1 g/dL (ref 30.0–36.0)
MCV: 93.8 fL (ref 78.0–100.0)
Platelets: 52 10*3/uL — ABNORMAL LOW (ref 150–400)
RBC: 3.88 MIL/uL — AB (ref 4.22–5.81)
RDW: 14.9 % (ref 11.5–15.5)
WBC: 2.8 10*3/uL — ABNORMAL LOW (ref 4.0–10.5)

## 2018-05-28 LAB — BASIC METABOLIC PANEL
ANION GAP: 10 (ref 5–15)
BUN: 21 mg/dL (ref 8–23)
CO2: 27 mmol/L (ref 22–32)
Calcium: 8.5 mg/dL — ABNORMAL LOW (ref 8.9–10.3)
Chloride: 104 mmol/L (ref 98–111)
Creatinine, Ser: 1.36 mg/dL — ABNORMAL HIGH (ref 0.61–1.24)
GFR calc Af Amer: 55 mL/min — ABNORMAL LOW (ref >60)
GFR calc non Af Amer: 48 mL/min — ABNORMAL LOW (ref >60)
GLUCOSE: 146 mg/dL — AB (ref 70–99)
POTASSIUM: 4.3 mmol/L (ref 3.5–5.1)
Sodium: 141 mmol/L (ref 135–145)

## 2018-05-28 LAB — I-STAT TROPONIN, ED: Troponin i, poc: 0.03 ng/mL (ref 0.00–0.08)

## 2018-05-28 NOTE — ED Triage Notes (Addendum)
Pt states he checks his heart rate at home and it was in the 30s. Non symptomatic.  After walking up and down steps HR improved to the 70s and then returned to the 30s.  HR in the 70s in triage. No cp, n/v, shob or other symptoms. Pt has hx of leukemia followed at [facility]. ALso hx of "mild" CHF for which he takes lasix. No increased swelling lately.

## 2018-05-28 NOTE — Telephone Encounter (Signed)
Patients wife Mechele Claude states that she recently returned home, and her husband told her his "heart rate has been running in the 30's."  She advised him to climb the stairs and his rate increased to 60 while climbing stairs, but soon returned to the 30's when he sat down.  Chiquita Loth, RN advised patients wife to report to nearest ED to further evaluation.  Patients wife verbalized understanding.

## 2018-05-29 ENCOUNTER — Telehealth: Payer: Self-pay | Admitting: Cardiology

## 2018-05-29 NOTE — Telephone Encounter (Signed)
Patient called the office yesterday afternoon stating his heart rate was in the 30's at rest. When patient was up and walking, his heart rate would be in the 70's, but as soon as he sat down, it would be in the 30's again. Patient was advised by Caryl Pina, RN to go to the emergency room to be evaluated. Patient went and wanted you to review the notes, EKG, and labs. Please advise of any further recommendations. Thanks!

## 2018-05-29 NOTE — ED Notes (Signed)
Called Patient to reassess vitals x3 and had no answer.

## 2018-05-29 NOTE — Telephone Encounter (Signed)
Went to ER last night as advised and wants Dr Bettina Gavia to "take a look at that"

## 2018-05-30 ENCOUNTER — Telehealth: Payer: Self-pay | Admitting: Cardiology

## 2018-05-30 NOTE — Telephone Encounter (Signed)
Patient is worried since his heart rate is in 30s. Taken to [facility] and they did ekg, bloodwork and chest xray but were never seen by physician, can you contact them?

## 2018-05-30 NOTE — Progress Notes (Signed)
Cardiology Office Note:    Date:  05/31/2018   ID:  Jesse [name], DOB [birth year], MRN [id number]  PCP:  Rochel Brome, MD  Cardiologist:  Shirlee More, MD    Referring MD: Rochel Brome, MD    ASSESSMENT:    1. Bradycardia   2. Chronic diastolic heart failure (HCC)   3. Vasculitis determined by biopsy of skin ([place])   4. CLL (chronic lymphocytic leukemia) (HCC)    PLAN:    In order of problems listed above:  1. I explained to him that these finger devices can be confused with PVCs in the bed to avoid pauses unsure if he is having real bradycardia but symptomatic will apply a 3-day ZIO monitor and decide if he has significant bradycardia and warrants consideration of pacemaker. 2. Stable compensated continue his diuretic 3. Improved managed by dermatology 4. Stable managed by hematology oncology 5. Stable aortic stenosis mild   Next appointment: 4 to 6 weeks after the monitor   Medication Adjustments/Labs and Tests Ordered: Current medicines are reviewed at length with the patient today.  Concerns regarding medicines are outlined above.  Orders Placed This Encounter  Procedures  . LONG TERM MONITOR (3-14 DAYS)  . EKG 12-Lead   No orders of the defined types were placed in this encounter.   Chief Complaint  Patient presents with  . Bradycardia    History of Present Illness:    He is self referred for bradycardia, HR 30 BPM, went to [facility] ED but left without seeing a doctor, EKG with [facility] frequent PVC's and couplets. Jesse [name] is a 77 y.o. male with a hx of heart failure and mild AS last seen 02/05/18. Compliance with diet, lifestyle and medications: Yes  He has not felt well his episodes of weakness almost near syncope he put a pulse monitor on he gets pulses it dropped down as low as 30 beats but is intermittent sporadic and he has PVCs.  He was concerned went to [facility], ED, ASAP for several hours EKG did not show bradycardia labs including troponin were normal  and he decided to leave the hospital.  He sees me in follow-up and is very concerned that he is going to need a pacemaker as he has a brother who has one he has not had syncope takes no medications causing bradycardia.  His heart failure is compensated no edema or shortness of breath no chest pain palpitation or syncope.  He is unaware of the PVCs Past Medical History:  Diagnosis Date  . Anemia    takes Ferrous Sulfate daily  . Anxiety    takes Xanax daily  . Arthritis   . BPH (benign prostatic hyperplasia) 10/25/2017  . Bradycardia 10/25/2017  . CAD (coronary artery disease) 10/25/2017  . Chest pain 10/25/2017  . Chronic back pain   . Chronic ITP (idiopathic thrombocytopenia) (HCC) 08/08/2013  . Chronic lymphocytic leukemia ([place]) 10/25/2017  . Constipation    takes Colace daily as needed  . Dry eyes    eye drops as needed  . Dyspnea on exertion 10/25/2017  . History of blood transfusion    no abnormal reaction noted  . History of kidney stones   . Hyperlipemia 10/25/2017  . Hyperlipidemia    takes Pravastatin daily  . Infusion reaction 4 yrs ago   platelet infusion broke out head to toe  . Insomnia    takes trazodone nightly as needed  . Leukemia ([place])   . Low back pain 12/12/2012  .  Lumbar radiculopathy 01/09/2013  . Muscle spasm    takes Robaxin daily as needed  . Nerve pain    takes Gabapentin daily   . Nocturia   . Renal stones 10/25/2017  . Status post lumbar surgery 12/02/2015  . Thrombocytopenia, secondary 10/25/2017   Overview:  secondary to CLL  . Vitamin B12 deficiency    takes Vit B12 daily  . Weakness    numbness and tingling    Past Surgical History:  Procedure Laterality Date  . ANTERIOR LAT LUMBAR FUSION N/A 12/02/2015   Procedure: L3-4 ANTERIOR LATERAL LUMBAR FUSION w/lateral plate;  Surgeon: Eustace Moore, MD;  Location: [place] [facility];  Service: Neurosurgery;  Laterality: N/A;  L3-4 ANTERIOR LATERAL LUMBAR FUSION w/lateral plate  . BACK SURGERY  2012   fusion x 2  .  CATARACT EXTRACTION    . CERVICAL FUSION  1988   x 2  . COLONOSCOPY    . FOOT SURGERY    . HERNIA REPAIR Left    inguinal  . KNEE SURGERY    . LAMINECTOMY    . plates/screws/rods removed  2015  . TOE SURGERY    . TRANSURETHRAL RESECTION OF PROSTATE      Current Medications: Current Meds  Medication Sig  . ALPRAZolam (XANAX) 0.5 MG tablet Take 0.5 mg by mouth 3 (three) times daily.  Marland Kitchen b complex vitamins tablet Take 1 tablet by mouth daily.   Marland Kitchen FOLIC ACID PO Take 1 tablet by mouth daily.  . furosemide (LASIX) 40 MG tablet Take 0.5 tablets (20 mg total) by mouth daily. Increase to 40 mg if weight is up 3 lbs from your baseline (Patient taking differently: Take 40 mg by mouth daily. Increase to 40 mg if weight is up 3 lbs from your baseline)  . gabapentin (NEURONTIN) 600 MG tablet Take 600 mg by mouth 3 (three) times daily.  . Homeopathic Products ([employer] LEG CRAMPS PAIN RELIEF PO) Take 1-2 tablets by mouth at bedtime as needed.  Marland Kitchen levothyroxine (SYNTHROID, LEVOTHROID) 100 MCG tablet Take 100 mcg by mouth daily before breakfast.   . methocarbamol (ROBAXIN) 750 MG tablet Take 750 mg by mouth every 6 (six) hours as needed for muscle spasms.  . Methotrexate, Anti-Rheumatic, (METHOTREXATE, PF, [place]) Inject 0.6 mLs into the skin once a week.  Marland Kitchen oxyCODONE-acetaminophen (PERCOCET) 10-325 MG tablet Take 1 tablet by mouth every 6 (six) hours as needed for pain.  . pravastatin (PRAVACHOL) 80 MG tablet Take 80 mg by mouth daily.  . Probiotic Product (PROBIOTIC DAILY PO) Take 1 capsule by mouth daily.  . Tetrahydrozoline-PEG (EYE DROPS EXTRA OP) Apply 1-2 drops to eye as needed.  . traZODone (DESYREL) 50 MG tablet Take 100 mg by mouth at bedtime as needed for sleep.   . vitamin B-12 (CYANOCOBALAMIN) 1000 MCG tablet Take 1,000 mcg by mouth daily.  . vitamin C (ASCORBIC ACID) 500 MG tablet Take 500 mg by mouth daily.      Allergies:   Patient has no known allergies.   Social History   Socioeconomic  History  . Marital status: Married    Spouse name: Not on file  . Number of children: Not on file  . Years of education: Not on file  . Highest education level: Not on file  Occupational History  . Not on file  Social Needs  . Financial resource strain: Not on file  . Food insecurity:    Worry: Not on file    Inability: Not on  file  . Transportation needs:    Medical: Not on file    Non-medical: Not on file  Tobacco Use  . Smoking status: Never Smoker  . Smokeless tobacco: Never Used  Substance and Sexual Activity  . Alcohol use: No  . Drug use: No  . Sexual activity: Not Currently  Lifestyle  . Physical activity:    Days per week: Not on file    Minutes per session: Not on file  . Stress: Not on file  Relationships  . Social connections:    Talks on phone: Not on file    Gets together: Not on file    Attends religious service: Not on file    Active member of club or organization: Not on file    Attends meetings of clubs or organizations: Not on file    Relationship status: Not on file  Other Topics Concern  . Not on file  Social History Narrative  . Not on file     Family History: The patient's family history includes CAD in his brother and father; Congestive Heart Failure in his brother; Kidney failure in his brother; Leukemia in his brother; Prostate cancer in his brother; Throat cancer in his mother. ROS:   Please see the history of present illness.    All other systems reviewed and are negative.  EKGs/Labs/Other Studies Reviewed:    The following studies were reviewed today:  EKG:  EKG ordered today.  The ekg ordered today demonstrates sinus rhythm normal  Recent Labs: 11/14/2017: NT-Pro BNP 1,344 05/28/2018: BUN 21; Creatinine, Ser 1.36; Hemoglobin 11.7; Platelets 52; Potassium 4.3; Sodium 141  Recent Lipid Panel No results found for: CHOL, TRIG, HDL, CHOLHDL, VLDL, LDLCALC, LDLDIRECT  Physical Exam:    VS:  BP 110/64 (BP Location: Right Arm, Patient  Position: Sitting, Cuff Size: Normal)   Pulse (!) 51   Ht 5\' 9"  (1.753 m)   Wt 185 lb (83.9 kg)   SpO2 94%   BMI 27.32 kg/m     Wt Readings from Last 3 Encounters:  05/31/18 185 lb (83.9 kg)  02/05/18 178 lb (80.7 kg)  12/12/17 172 lb (78 kg)     GEN:  Well nourished, well developed in no acute distress HEENT: Normal NECK: No JVD; No carotid bruits LYMPHATICS: No lymphadenopathy CARDIAC: 1/6 systolic ejection murmur known aortic stenosis RRR, no murmurs, rubs, gallops RESPIRATORY:  Clear to auscultation without rales, wheezing or rhonchi  ABDOMEN: Soft, non-tender, non-distended MUSCULOSKELETAL:  No edema; No deformity  SKIN: Warm and dry NEUROLOGIC:  Alert and oriented x 3 PSYCHIATRIC:  Normal affect    Signed, Shirlee More, MD  05/31/2018 11:48 AM    [place]

## 2018-05-30 NOTE — Telephone Encounter (Signed)
Patient's follow up appointment has been moved up to tomorrow, 05/31/18 at 10:40 am per Dr. Joya Gaskins request. Jesse [name] informed and verbalized understanding per DPR. No further questions.

## 2018-05-31 ENCOUNTER — Ambulatory Visit (INDEPENDENT_AMBULATORY_CARE_PROVIDER_SITE_OTHER): Payer: Medicare HMO | Admitting: Cardiology

## 2018-05-31 ENCOUNTER — Encounter: Payer: Self-pay | Admitting: Cardiology

## 2018-05-31 VITALS — BP 110/64 | HR 51 | Ht 69.0 in | Wt 185.0 lb

## 2018-05-31 DIAGNOSIS — I5032 Chronic diastolic (congestive) heart failure: Secondary | ICD-10-CM | POA: Diagnosis not present

## 2018-05-31 DIAGNOSIS — I776 Arteritis, unspecified: Secondary | ICD-10-CM

## 2018-05-31 DIAGNOSIS — R001 Bradycardia, unspecified: Secondary | ICD-10-CM | POA: Diagnosis not present

## 2018-05-31 DIAGNOSIS — C9190 Lymphoid leukemia, unspecified not having achieved remission: Secondary | ICD-10-CM | POA: Diagnosis not present

## 2018-05-31 DIAGNOSIS — C9110 Chronic lymphocytic leukemia of B-cell type not having achieved remission: Secondary | ICD-10-CM

## 2018-05-31 NOTE — Patient Instructions (Addendum)
Medication Instructions:  Your physician recommends that you continue on your current medications as directed. Please refer to the Current Medication list given to you today.  Labwork: None  Testing/Procedures: You had an EKG today.   Your physician has recommended that you wear a ZIO patch monitor. ZIO patch monitors are medical devices that record the heart's electrical activity. Doctors most often use these monitors to diagnose arrhythmias. Arrhythmias are problems with the speed or rhythm of the heartbeat. The monitor is a small, portable device. You can wear one while you do your normal daily activities. This is usually used to diagnose what is causing palpitations/syncope (passing out). Wear for 3 days.   Follow-Up: Your physician recommends that you schedule a follow-up appointment in: 4 weeks.  If you need a refill on your cardiac medications before your next appointment, please call your pharmacy.   Thank you for choosing [facility]! Robyne Peers, RN [phone]      [email or web address] [email or web address]        FDA-cleared, clinical grade mobile EKG monitor: Jodelle Red is the most clinically-validated mobile EKG used by the world's leading cardiac care medical professionals With Basic service, know instantly if your heart rhythm is normal or if atrial fibrillation is detected, and email the last single EKG recording to yourself or your doctor Premium service, available for purchase through the Kardia app for $9.99 per month or $99 per year, includes unlimited history and storage of your EKG recordings, a monthly EKG summary report to share with your doctor, along with the ability to track your blood pressure, activity and weight Includes one KardiaMobile phone clip FREE SHIPPING: Standard delivery 1-3 business days. Orders placed by 11:00am PST will ship that afternoon. Otherwise, will ship next business day. All orders ship via [employer] from [place], [place]

## 2018-06-01 ENCOUNTER — Ambulatory Visit (INDEPENDENT_AMBULATORY_CARE_PROVIDER_SITE_OTHER): Payer: Medicare HMO

## 2018-06-01 DIAGNOSIS — R001 Bradycardia, unspecified: Secondary | ICD-10-CM | POA: Diagnosis not present

## 2018-06-07 DIAGNOSIS — R001 Bradycardia, unspecified: Secondary | ICD-10-CM | POA: Diagnosis not present

## 2018-06-12 DIAGNOSIS — R531 Weakness: Secondary | ICD-10-CM | POA: Diagnosis not present

## 2018-06-20 ENCOUNTER — Ambulatory Visit: Payer: Medicare HMO | Admitting: Cardiology

## 2018-06-20 ENCOUNTER — Ambulatory Visit (INDEPENDENT_AMBULATORY_CARE_PROVIDER_SITE_OTHER): Payer: Medicare HMO | Admitting: Cardiology

## 2018-06-20 VITALS — BP 108/64 | HR 50 | Ht 69.0 in | Wt 184.0 lb

## 2018-06-20 DIAGNOSIS — I5032 Chronic diastolic (congestive) heart failure: Secondary | ICD-10-CM | POA: Diagnosis not present

## 2018-06-20 DIAGNOSIS — I493 Ventricular premature depolarization: Secondary | ICD-10-CM | POA: Diagnosis not present

## 2018-06-20 DIAGNOSIS — R001 Bradycardia, unspecified: Secondary | ICD-10-CM

## 2018-06-20 NOTE — Progress Notes (Signed)
Cardiology Office Note:    Date:  06/20/2018   ID:  Jesse [name], DOB 1941/04/01, MRN [id number]  PCP:  Rochel Brome, MD  Cardiologist:  Shirlee More, MD    Referring MD: Rochel Brome, MD    ASSESSMENT:    1. PVC's (premature ventricular contractions)   2. Chronic diastolic heart failure ([place])   3. Bradycardia    PLAN:    In order of problems listed above:  1. He is asymptomatic takes no proarrhythmic drugs and at this time I would not try to suppress his heart rhythm.  I asked him if he is increasingly symptomatic to contact me.  Also [place] avoid over-the-counter proarrhythmic drugs 2. Stable compensated continue his current diuretic 3. I think it be best termed a pseudo-bradycardia is due to compensatory pause with bigeminy he is asymptomatic I reviewed his Holter extended monitor with him at this time I would not use any additional medications for suppression of PVCs   Next appointment: 6 months   Medication Adjustments/Labs and Tests Ordered: Current medicines are reviewed at length with the patient today.  Concerns regarding medicines are outlined above.  No orders of the defined types were placed in this encounter.  No orders of the defined types were placed in this encounter.   No chief complaint on file.   History of Present Illness:    Jesse [name] is a 77 y.o. male with a hx of heart failure and mild AS last seen 05/31/18 for bradycardia.. Compliance with diet, lifestyle and medications: Yes  He is reassured by the results of his Holter monitor he is not having palpitations syncope or near syncope and his congestive heart failure stable without edema or shortness of breath.  A subsequent Holter monitor showed PVCs 6.1% with episodes of bigeminy and trigeminy.  The patient had a symptomatic event of slow heart rate associated with bigeminy there are no episodes of sinus node or AV block and the minimum sinus rate is 41 bpm bigeminy persisted for over 5 minutes  and trigeminy for close to 2 minutes. Past Medical History:  Diagnosis Date  . Anemia    takes Ferrous Sulfate daily  . Anxiety    takes Xanax daily  . Arthritis   . BPH (benign prostatic hyperplasia) 10/25/2017  . Bradycardia 10/25/2017  . CAD (coronary artery disease) 10/25/2017  . Chest pain 10/25/2017  . Chronic back pain   . Chronic ITP (idiopathic thrombocytopenia) (HCC) 08/08/2013  . Chronic lymphocytic leukemia ([place]) 10/25/2017  . Constipation    takes Colace daily as needed  . Dry eyes    eye drops as needed  . Dyspnea on exertion 10/25/2017  . History of blood transfusion    no abnormal reaction noted  . History of kidney stones   . Hyperlipemia 10/25/2017  . Hyperlipidemia    takes Pravastatin daily  . Infusion reaction 4 yrs ago   platelet infusion broke out head to toe  . Insomnia    takes trazodone nightly as needed  . Leukemia ([place])   . Low back pain 12/12/2012  . Lumbar radiculopathy 01/09/2013  . Muscle spasm    takes Robaxin daily as needed  . Nerve pain    takes Gabapentin daily   . Nocturia   . Renal stones 10/25/2017  . Status post lumbar surgery 12/02/2015  . Thrombocytopenia, secondary 10/25/2017   Overview:  secondary to CLL  . Vitamin B12 deficiency    takes Vit B12 daily  . Weakness  numbness and tingling    Past Surgical History:  Procedure Laterality Date  . ANTERIOR LAT LUMBAR FUSION N/A 12/02/2015   Procedure: L3-4 ANTERIOR LATERAL LUMBAR FUSION w/lateral plate;  Surgeon: Eustace Moore, MD;  Location: [facility];  Service: Neurosurgery;  Laterality: N/A;  L3-4 ANTERIOR LATERAL LUMBAR FUSION w/lateral plate  . BACK SURGERY  2012   fusion x 2  . CATARACT EXTRACTION    . CERVICAL FUSION  1988   x 2  . COLONOSCOPY    . FOOT SURGERY    . HERNIA REPAIR Left    inguinal  . KNEE SURGERY    . LAMINECTOMY    . plates/screws/rods removed  2015  . TOE SURGERY    . TRANSURETHRAL RESECTION OF PROSTATE      Current Medications: Current Meds    Medication Sig  . ALPRAZolam (XANAX) 0.5 MG tablet Take 0.5 mg by mouth 3 (three) times daily.  Marland Kitchen b complex vitamins tablet Take 1 tablet by mouth daily.   Marland Kitchen FOLIC ACID PO Take 1 tablet by mouth daily.  . furosemide (LASIX) 40 MG tablet Take 0.5 tablets (20 mg total) by mouth daily. Increase to 40 mg if weight is up 3 lbs from your baseline (Patient taking differently: Take 40 mg by mouth daily. Increase to 40 mg if weight is up 3 lbs from your baseline)  . gabapentin (NEURONTIN) 600 MG tablet Take 600 mg by mouth 3 (three) times daily.  . Homeopathic Products ([employer] LEG CRAMPS PAIN RELIEF PO) Take 1-2 tablets by mouth at bedtime as needed.  Marland Kitchen levothyroxine (SYNTHROID, LEVOTHROID) 100 MCG tablet Take 100 mcg by mouth daily before breakfast.   . methocarbamol (ROBAXIN) 750 MG tablet Take 750 mg by mouth every 6 (six) hours as needed for muscle spasms.  . Methotrexate, Anti-Rheumatic, (METHOTREXATE, PF, [place]) Inject 0.6 mLs into the skin once a week.  Marland Kitchen oxyCODONE-acetaminophen (PERCOCET) 10-325 MG tablet Take 1 tablet by mouth every 6 (six) hours as needed for pain.  . pravastatin (PRAVACHOL) 80 MG tablet Take 80 mg by mouth daily.  . Probiotic Product (PROBIOTIC DAILY PO) Take 1 capsule by mouth daily.  . Tetrahydrozoline-PEG (EYE DROPS EXTRA OP) Apply 1-2 drops to eye as needed.  . traZODone (DESYREL) 50 MG tablet Take 100 mg by mouth at bedtime as needed for sleep.   . vitamin B-12 (CYANOCOBALAMIN) 1000 MCG tablet Take 1,000 mcg by mouth daily.  . vitamin C (ASCORBIC ACID) 500 MG tablet Take 500 mg by mouth daily.      Allergies:   Patient has no known allergies.   Social History   Socioeconomic History  . Marital status: Married    Spouse name: Not on file  . Number of children: Not on file  . Years of education: Not on file  . Highest education level: Not on file  Occupational History  . Not on file  Social Needs  . Financial resource strain: Not on file  . Food insecurity:     Worry: Not on file    Inability: Not on file  . Transportation needs:    Medical: Not on file    Non-medical: Not on file  Tobacco Use  . Smoking status: Never Smoker  . Smokeless tobacco: Never Used  Substance and Sexual Activity  . Alcohol use: No  . Drug use: No  . Sexual activity: Not Currently  Lifestyle  . Physical activity:    Days per week: Not on file  Minutes per session: Not on file  . Stress: Not on file  Relationships  . Social connections:    Talks on phone: Not on file    Gets together: Not on file    Attends religious service: Not on file    Active member of club or organization: Not on file    Attends meetings of clubs or organizations: Not on file    Relationship status: Not on file  Other Topics Concern  . Not on file  Social History Narrative  . Not on file     Family History: The patient's family history includes CAD in his brother and father; Congestive Heart Failure in his brother; Kidney failure in his brother; Leukemia in his brother; Prostate cancer in his brother; Throat cancer in his mother. ROS:   Please see the history of present illness.    All other systems reviewed and are negative.  EKGs/Labs/Other Studies Reviewed:    The following studies were reviewed today:   Recent Labs: 11/14/2017: NT-Pro BNP 1,344 05/28/2018: BUN 21; Creatinine, Ser 1.36; Hemoglobin 11.7; Platelets 52; Potassium 4.3; Sodium 141  Recent Lipid Panel No results found for: CHOL, TRIG, HDL, CHOLHDL, VLDL, LDLCALC, LDLDIRECT  Physical Exam:    VS:  BP 108/64 (BP Location: Right Arm, Patient Position: Sitting, Cuff Size: Normal)   Pulse (!) 50   Ht 5\' 9"  (1.753 m)   Wt 184 lb (83.5 kg)   SpO2 97%   BMI 27.17 kg/m     Wt Readings from Last 3 Encounters:  06/20/18 184 lb (83.5 kg)  05/31/18 185 lb (83.9 kg)  02/05/18 178 lb (80.7 kg)     GEN:  Well nourished, well developed in no acute distress HEENT: Normal NECK: No JVD; No carotid bruits LYMPHATICS:  No lymphadenopathy CARDIAC: RRR, no murmurs, rubs, gallops RESPIRATORY:  Clear to auscultation without rales, wheezing or rhonchi  ABDOMEN: Soft, non-tender, non-distended MUSCULOSKELETAL:  No edema; No deformity  SKIN: Warm and dry NEUROLOGIC:  Alert and oriented x 3 PSYCHIATRIC:  Normal affect    Signed, Shirlee More, MD  06/20/2018 1:44 PM    [facility]

## 2018-06-20 NOTE — Patient Instructions (Addendum)
Medication Instructions:  Your physician recommends that you continue on your current medications as directed. Please refer to the Current Medication list given to you today.   Labwork: None  Testing/Procedures: None  Follow-Up: Your physician wants you to follow-up in: 6 months. You will receive a reminder letter in the mail two months in advance. If you don't receive a letter, please call our office to schedule the follow-up appointment.   If you need a refill on your cardiac medications before your next appointment, please call your pharmacy.   Thank you for choosing [facility]! Robyne Peers, RN [phone]      1. Avoid all over-the-counter antihistamines except Claritin/Loratadine and Zyrtec/Cetrizine. 2. Avoid all combination including cold sinus allergies flu decongestant and sleep medications 3. You can use Robitussin DM Mucinex and Mucinex DM for cough. 4. can use Tylenol aspirin ibuprofen and naproxen but no combinations such as sleep or sinus.

## 2018-06-21 DIAGNOSIS — Z23 Encounter for immunization: Secondary | ICD-10-CM | POA: Diagnosis not present

## 2018-06-21 DIAGNOSIS — Z0000 Encounter for general adult medical examination without abnormal findings: Secondary | ICD-10-CM | POA: Diagnosis not present

## 2018-06-21 DIAGNOSIS — Z6827 Body mass index (BMI) 27.0-27.9, adult: Secondary | ICD-10-CM | POA: Diagnosis not present

## 2018-06-21 DIAGNOSIS — R400 Somnolence: Secondary | ICD-10-CM | POA: Diagnosis not present

## 2018-06-21 DIAGNOSIS — E663 Overweight: Secondary | ICD-10-CM | POA: Diagnosis not present

## 2018-07-23 DIAGNOSIS — D693 Immune thrombocytopenic purpura: Secondary | ICD-10-CM | POA: Diagnosis not present

## 2018-07-23 DIAGNOSIS — Z981 Arthrodesis status: Secondary | ICD-10-CM | POA: Diagnosis not present

## 2018-07-23 DIAGNOSIS — M545 Low back pain: Secondary | ICD-10-CM | POA: Diagnosis not present

## 2018-07-23 DIAGNOSIS — D509 Iron deficiency anemia, unspecified: Secondary | ICD-10-CM | POA: Diagnosis not present

## 2018-07-23 DIAGNOSIS — C9110 Chronic lymphocytic leukemia of B-cell type not having achieved remission: Secondary | ICD-10-CM | POA: Diagnosis not present

## 2018-07-23 DIAGNOSIS — L959 Vasculitis limited to the skin, unspecified: Secondary | ICD-10-CM | POA: Diagnosis not present

## 2018-07-23 DIAGNOSIS — Z79899 Other long term (current) drug therapy: Secondary | ICD-10-CM | POA: Diagnosis not present

## 2018-08-08 ENCOUNTER — Telehealth: Payer: Self-pay | Admitting: Cardiology

## 2018-08-08 DIAGNOSIS — E038 Other specified hypothyroidism: Secondary | ICD-10-CM | POA: Diagnosis not present

## 2018-08-08 DIAGNOSIS — R001 Bradycardia, unspecified: Secondary | ICD-10-CM

## 2018-08-08 DIAGNOSIS — I493 Ventricular premature depolarization: Secondary | ICD-10-CM

## 2018-08-08 NOTE — Telephone Encounter (Signed)
Please call patient regarding still having very low pulse in the 30's and he is concerned.

## 2018-08-08 NOTE — Telephone Encounter (Signed)
Left message to return call 

## 2018-08-08 NOTE — Telephone Encounter (Signed)
Patient's wife, Jesse [name], per DPR reports patient's heart rate has been staying in the 30's.. Patient has no energy, sleeps a lot more than he used to, and has an unsteady gait. Jesse [name] states they have not been monitoring his blood pressure recently. Will have Dr. Bettina Gavia advise.

## 2018-08-08 NOTE — Telephone Encounter (Signed)
I thinkhe s=hould see EP re PVC's and bradycardia

## 2018-08-09 NOTE — Telephone Encounter (Signed)
Jesse [name] informed that Dr. Bettina Gavia recommends a referral to our electrophysiologist, Dr. Curt Bears. Jesse [name] is agreeable. Referral placed and message sent to Dr. Macky Lower scheduler and nurse. No further questions.

## 2018-08-09 NOTE — Addendum Note (Signed)
Addended by: Austin Miles on: 08/09/2018 12:17 PM   Modules accepted: Orders

## 2018-08-15 ENCOUNTER — Telehealth: Payer: Self-pay | Admitting: *Deleted

## 2018-08-15 NOTE — Telephone Encounter (Signed)
Patient called with concern about his low heart rate. Patient reports his HR was 41 when up walking to the mail box but then he came back in to rest and his HR dropped to 26. Reminded him with his PVCs and bigeminy that the HR he gets on his home pulse oximeter is not always accurate. Advised patient that he bradycardia is why we have referred him to see our electrophysiologist, Dr. Curt Bears on 09/04/18 at 2:00 pm at the [facility] location. Patient reports persistent fatigue but no further complaints. Advised patient to contact our office with worsening of symptoms. Will make Dr. Bettina Gavia aware.

## 2018-08-20 NOTE — Progress Notes (Signed)
Electrophysiology Office Note   Date:  08/21/2018   ID:  Jesse [name], DOB 10-05-1940, MRN [id number]  PCP:  Rochel Brome, MD  Cardiologist:  Bettina Gavia Primary Electrophysiologist:  Constance Haw, MD    No chief complaint on file.    History of Present Illness: Jesse [name] is a 77 y.o. male who is being seen today for the evaluation of PVCs at the request of Shirlee More. Presenting today for electrophysiology evaluation.  Has a history significant for coronary artery disease, ITP, CLL, hypertension, hyperlipidemia.  He wore recent Holter monitor that showed 6.1% PVCs with episodes of bigeminy and trigeminy.  He has had some bradycardia associated with his PVCs.  Main symptoms associated with PVCs are weakness, fatigue, and shortness of breath.  He has no chest pain.  He does note that his heart rate is in the 30s at times.  Today, he denies symptoms of chest pain, orthopnea, PND, lower extremity edema, claudication, dizziness, presyncope, syncope, bleeding, or neurologic sequela. The patient is tolerating medications without difficulties.    Past Medical History:  Diagnosis Date  . Anemia    takes Ferrous Sulfate daily  . Anxiety    takes Xanax daily  . Arthritis   . BPH (benign prostatic hyperplasia) 10/25/2017  . Bradycardia 10/25/2017  . CAD (coronary artery disease) 10/25/2017  . Chest pain 10/25/2017  . Chronic back pain   . Chronic ITP (idiopathic thrombocytopenia) (HCC) 08/08/2013  . Chronic lymphocytic leukemia ([place]) 10/25/2017  . Constipation    takes Colace daily as needed  . Dry eyes    eye drops as needed  . Dyspnea on exertion 10/25/2017  . History of blood transfusion    no abnormal reaction noted  . History of kidney stones   . Hyperlipemia 10/25/2017  . Hyperlipidemia    takes Pravastatin daily  . Infusion reaction 4 yrs ago   platelet infusion broke out head to toe  . Insomnia    takes trazodone nightly as needed  . Leukemia ([place])   . Low back pain  12/12/2012  . Lumbar radiculopathy 01/09/2013  . Muscle spasm    takes Robaxin daily as needed  . Nerve pain    takes Gabapentin daily   . Nocturia   . Renal stones 10/25/2017  . Status post lumbar surgery 12/02/2015  . Thrombocytopenia, secondary 10/25/2017   Overview:  secondary to CLL  . Vitamin B12 deficiency    takes Vit B12 daily  . Weakness    numbness and tingling   Past Surgical History:  Procedure Laterality Date  . ANTERIOR LAT LUMBAR FUSION N/A 12/02/2015   Procedure: L3-4 ANTERIOR LATERAL LUMBAR FUSION w/lateral plate;  Surgeon: Eustace Moore, MD;  Location: [facility];  Service: Neurosurgery;  Laterality: N/A;  L3-4 ANTERIOR LATERAL LUMBAR FUSION w/lateral plate  . BACK SURGERY  2012   fusion x 2  . CATARACT EXTRACTION    . CERVICAL FUSION  1988   x 2  . COLONOSCOPY    . FOOT SURGERY    . HERNIA REPAIR Left    inguinal  . KNEE SURGERY    . LAMINECTOMY    . plates/screws/rods removed  2015  . TOE SURGERY    . TRANSURETHRAL RESECTION OF PROSTATE       Current Outpatient Medications  Medication Sig Dispense Refill  . ALPRAZolam (XANAX) 0.5 MG tablet Take 0.5 mg by mouth 3 (three) times daily.    Marland Kitchen b complex vitamins tablet Take 1  tablet by mouth daily.     Marland Kitchen FOLIC ACID PO Take 1 tablet by mouth daily.    . furosemide (LASIX) 40 MG tablet Take 0.5 tablets (20 mg total) by mouth daily. Increase to 40 mg if weight is up 3 lbs from your baseline (Patient taking differently: Take 40 mg by mouth daily. Increase to 40 mg if weight is up 3 lbs from your baseline) 90 tablet 3  . gabapentin (NEURONTIN) 600 MG tablet Take 600 mg by mouth 3 (three) times daily.    . Homeopathic Products ([employer]S LEG CRAMPS PAIN RELIEF PO) Take 1-2 tablets by mouth at bedtime as needed.    Marland Kitchen levothyroxine (SYNTHROID, LEVOTHROID) 100 MCG tablet Take 100 mcg by mouth daily before breakfast.     . methocarbamol (ROBAXIN) 750 MG tablet Take 750 mg by mouth every 6 (six) hours as needed for muscle  spasms.    . Methotrexate, Anti-Rheumatic, (METHOTREXATE, PF, ) Inject 0.6 mLs into the skin once a week.    Marland Kitchen oxyCODONE-acetaminophen (PERCOCET) 10-325 MG tablet Take 1 tablet by mouth every 6 (six) hours as needed for pain. 60 tablet 0  . pravastatin (PRAVACHOL) 80 MG tablet Take 80 mg by mouth daily.    . Probiotic Product (PROBIOTIC DAILY PO) Take 1 capsule by mouth daily.    . Tetrahydrozoline-PEG (EYE DROPS EXTRA OP) Apply 1-2 drops to eye as needed.    . traZODone (DESYREL) 50 MG tablet Take 100 mg by mouth at bedtime as needed for sleep.     . vitamin B-12 (CYANOCOBALAMIN) 1000 MCG tablet Take 1,000 mcg by mouth daily.    . vitamin C (ASCORBIC ACID) 500 MG tablet Take 500 mg by mouth daily.      No current facility-administered medications for this visit.     Allergies:   Patient has no known allergies.   Social History:  The patient  reports that he has never smoked. He has never used smokeless tobacco. He reports that he does not drink alcohol or use drugs.   Family History:  The patient's family history includes CAD in his brother and father; Congestive Heart Failure in his brother; Kidney failure in his brother; Leukemia in his brother; Prostate cancer in his brother; Throat cancer in his mother.    ROS:  Please see the history of present illness.   Otherwise, review of systems is positive for fatigue, leg swelling, shortness of breath, palpitations, depression, anxiety, back pain, muscle pain, balance problems, dizziness, easy bruising.   All other systems are reviewed and negative.    PHYSICAL EXAM: VS:  BP 122/68   Pulse 62   Ht 5\' 9"  (1.753 m)   Wt 180 lb 6.4 oz (81.8 kg)   SpO2 98%   BMI 26.64 kg/m  , BMI Body mass index is 26.64 kg/m. GEN: Well nourished, well developed, in no acute distress  HEENT: normal  Neck: no JVD, carotid bruits, or masses Cardiac: RRR; no murmurs, rubs, or gallops,no edema  Respiratory:  clear to auscultation bilaterally, normal work of  breathing GI: soft, nontender, nondistended, + BS MS: no deformity or atrophy  Skin: warm and dry Neuro:  Strength and sensation are intact Psych: euthymic mood, full affect  EKG:  EKG is ordered today. Personal review of the ekg ordered shows sinus rhythm, ventricular bigeminy, rate 62  Recent Labs: 11/14/2017: NT-Pro BNP 1,344 05/28/2018: BUN 21; Creatinine, Ser 1.36; Hemoglobin 11.7; Platelets 52; Potassium 4.3; Sodium 141    Lipid Panel  No results found for: CHOL, TRIG, HDL, CHOLHDL, VLDL, LDLCALC, LDLDIRECT   Wt Readings from Last 3 Encounters:  08/21/18 180 lb 6.4 oz (81.8 kg)  06/20/18 184 lb (83.5 kg)  05/31/18 185 lb (83.9 kg)      Other studies Reviewed: Additional studies/ records that were reviewed today include: TTE 11/20/17  Review of the above records today demonstrates:  - Left ventricle: The cavity size was normal. Wall thickness was   increased in a pattern of mild LVH. Systolic function was normal.   The estimated ejection fraction was in the range of 55% to 60%.   Wall motion was normal; there were no regional wall motion   abnormalities. Features are consistent with a pseudonormal left   ventricular filling pattern, with concomitant abnormal relaxation   and increased filling pressure (grade 2 diastolic dysfunction).   Doppler parameters are consistent with high ventricular filling   pressure. - Aortic valve: There was mild stenosis. Valve area (VTI): 2.52   cm^2. Valve area (Vmax): 2.38 cm^2. Valve area (Vmean): 2.2 cm^2. - Mitral valve: There was mild regurgitation. - Left atrium: The atrium was mildly dilated. - Right atrium: The atrium was mildly dilated.  Cardiac Monitor 06/01/18 - personally reviewed The rhythm throughout his sinus with minimum average and maximum heart rates of 47, 61 and 127 bpm.  IVCD is noted.  The minimum rate is sinus bradycardia at 10:32 AM.  Ventricular ectopy, 6.1 % with frequent PVCs 309 couplets and one triplet.  There  are episodes of bigeminy and trigeminy noted.  The episode of ventricular bigeminy persisted for 5 minutes and 46 seconds and trigeminy 1 minute and 34 seconds.  Supraventricular ectopy, 4.9% with rare couplets and no episodes of atrial tachycardia atrial fibrillation or flutter.  There were no bradycardic events, no episodes of sinus node or AV block or pauses of 3 seconds or greater.  There were no triggered or symptomatic events noted.  Conclusion, no significant bradycardia noted.  I suspect the episode of patient noted bradycardia with bigeminy and trigeminy with the pulse oximeter.  There are frequent PVCs and APCs that are asymptomatic.  ASSESSMENT AND PLAN:  1.  PVCs: Appear to be coming from the outflow tracts.  He is in bigeminy on his EKG today, but on auscultation he is no longer having PVCs.  His symptoms are weakness, fatigue, and shortness of breath.  I did offer him ablation, but he would like to try medications initially.  He has a chart history of coronary artery disease.  I Amanie Mcculley discuss this with his primary cardiologist prior to starting medications.  He would potentially be a good flecainide candidate.  2. 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  This Tarrance Januszewski likely improve after PVC therapy.  3. Hypertension: Currently well controlled.  No changes.  4. Hyperlipidemia: Pravastatin per primary cardiology.  Current medicines are reviewed at length with the patient today.   The patient does not have concerns regarding his medicines.  The following changes were made today:  none  Labs/ tests ordered today include:  Orders Placed This Encounter  Procedures  . EKG 12-Lead     Disposition:   FU with Kyro Joswick 3 months  Signed, Hridhaan Yohn Meredith Leeds, MD  08/21/2018 2:32 PM     [facility] [address] [place] [address] [phone] (office) [phone] (fax)

## 2018-08-21 ENCOUNTER — Ambulatory Visit: Payer: Medicare HMO | Admitting: Cardiology

## 2018-08-21 ENCOUNTER — Encounter: Payer: Self-pay | Admitting: Cardiology

## 2018-08-21 ENCOUNTER — Telehealth: Payer: Self-pay | Admitting: Cardiology

## 2018-08-21 VITALS — BP 122/68 | HR 62 | Ht 69.0 in | Wt 180.4 lb

## 2018-08-21 DIAGNOSIS — I10 Essential (primary) hypertension: Secondary | ICD-10-CM | POA: Diagnosis not present

## 2018-08-21 DIAGNOSIS — E785 Hyperlipidemia, unspecified: Secondary | ICD-10-CM

## 2018-08-21 DIAGNOSIS — I493 Ventricular premature depolarization: Secondary | ICD-10-CM | POA: Diagnosis not present

## 2018-08-21 NOTE — Telephone Encounter (Signed)
-----   Message from Will Meredith Leeds, MD sent at 08/21/2018  3:34 PM EST ----- Thanks, I'll go ahead and start flecainide. ----- Message ----- From: Richardo Priest, MD Sent: 08/21/2018   3:09 PM EST To: Constance Haw, MD  I looked in the records I think when the chart was prepped first time he was seen Jesse [name] it was added under history he has had previous chest pain but does not have any documented CAD ----- Message ----- From: Constance Haw, MD Sent: 08/21/2018   2:32 PM EST To: Richardo Priest, MD  I saw Jesse [name] today.  He is feeling well, though he does continue to have symptoms from his PVCs.  They appear to be outflow tract morphology.  I did offer him ablation, but he would like to try medical management first.  I saw in your note that you mentioned that he has coronary artery disease.  I do not have any records and I am unable to get into his prior history.  You have any further information on his coronary disease?  I think that flecainide would potentially be a good option for him.  Thanks.

## 2018-08-21 NOTE — Patient Instructions (Signed)
Medication Instructions:  Your physician recommends that you continue on your current medications as directed. Please refer to the Current Medication list given to you today.  * If you need a refill on your cardiac medications before your next appointment, please call your pharmacy.   Labwork: None ordered *We will only notify you of abnormal results, otherwise continue current treatment plan.  Testing/Procedures: None ordered  Follow-Up: Your physician recommends that you schedule a follow-up appointment in: 3 months with Dr. Curt Bears in [place]   Thank you for choosing [facility]!!   Trinidad Curet, RN [phone]

## 2018-08-27 NOTE — Telephone Encounter (Signed)
Pt phoned today wanting to know if Dr. Curt Bears and Dr. Bettina Gavia have decided on treatment for his low HR. Let him know that Dr. Bettina Gavia and Curt Bears have talked and decided to start pt on Flecainide. I let him know I will let nurse know and will get that done asap.

## 2018-08-28 DIAGNOSIS — L300 Nummular dermatitis: Secondary | ICD-10-CM | POA: Diagnosis not present

## 2018-08-28 DIAGNOSIS — L299 Pruritus, unspecified: Secondary | ICD-10-CM | POA: Diagnosis not present

## 2018-08-28 DIAGNOSIS — L820 Inflamed seborrheic keratosis: Secondary | ICD-10-CM | POA: Diagnosis not present

## 2018-08-29 NOTE — Telephone Encounter (Signed)
Pt agreeable to starting Flecainide. Pt would prefer to have GXT in [place] office, he isn't sure they can come all the way to [facility] for testing. Explained that I would discuss w/ mgnt about this and call him by end of week/next week.  Pt will not start Flecainide until we speak again. Pt agreeable to plan.

## 2018-08-31 DIAGNOSIS — E038 Other specified hypothyroidism: Secondary | ICD-10-CM | POA: Diagnosis not present

## 2018-08-31 DIAGNOSIS — M545 Low back pain: Secondary | ICD-10-CM | POA: Diagnosis not present

## 2018-08-31 DIAGNOSIS — D6949 Other primary thrombocytopenia: Secondary | ICD-10-CM | POA: Diagnosis not present

## 2018-08-31 DIAGNOSIS — Z23 Encounter for immunization: Secondary | ICD-10-CM | POA: Diagnosis not present

## 2018-08-31 DIAGNOSIS — I495 Sick sinus syndrome: Secondary | ICD-10-CM | POA: Diagnosis not present

## 2018-08-31 DIAGNOSIS — E782 Mixed hyperlipidemia: Secondary | ICD-10-CM | POA: Diagnosis not present

## 2018-08-31 DIAGNOSIS — E663 Overweight: Secondary | ICD-10-CM | POA: Diagnosis not present

## 2018-08-31 DIAGNOSIS — I2510 Atherosclerotic heart disease of native coronary artery without angina pectoris: Secondary | ICD-10-CM | POA: Diagnosis not present

## 2018-08-31 DIAGNOSIS — R69 Illness, unspecified: Secondary | ICD-10-CM | POA: Diagnosis not present

## 2018-08-31 DIAGNOSIS — Z6828 Body mass index (BMI) 28.0-28.9, adult: Secondary | ICD-10-CM | POA: Diagnosis not present

## 2018-09-03 ENCOUNTER — Telehealth: Payer: Self-pay | Admitting: Cardiology

## 2018-09-03 DIAGNOSIS — I493 Ventricular premature depolarization: Secondary | ICD-10-CM

## 2018-09-03 NOTE — Telephone Encounter (Signed)
  Patient states that at last visit he was to start a new medication. He would like to know if the prescription ever got sent to the pharmacist and if not can that be sent? He is not sure what medicine it was.

## 2018-09-03 NOTE — Telephone Encounter (Signed)
Reminded patient that per our conversation last week - he would be starting Flecainide.  Per discussion last week pt did not want to drive to [facility] for stress testing and aware I would look into this.   Today, pt understands [place] office is where he will need to come and explained why he cannot have done out of the [place] office at this time. Pt agreeable to driving to the [facility] office for this specific testing. Aware office will call him to arrange this testing and that I would follow up and instruct him on when to start the new medication. Patient verbalized understanding and agreeable to plan.

## 2018-09-04 ENCOUNTER — Institutional Professional Consult (permissible substitution): Payer: Medicare HMO | Admitting: Cardiology

## 2018-09-05 MED ORDER — FLECAINIDE ACETATE 100 MG PO TABS: 100.0000 mg | ORAL_TABLET | Freq: Two times a day (BID) | ORAL | 3 refills | Status: DC

## 2018-09-05 NOTE — Telephone Encounter (Addendum)
GXT scheduled for 09/25/18, & testing instructions reviewed. Advised pt to start Flecainide 100 mg BID on 09/17/18. Patient verbalized understanding and agreeable to plan.  Pt also understands to take his Flecainide day of GXT testing.

## 2018-09-05 NOTE — Addendum Note (Signed)
Addended by: Stanton Kidney on: 09/05/2018 09:35 AM   Modules accepted: Orders

## 2018-09-06 NOTE — Telephone Encounter (Signed)
GXT scheduled for 09/25/18 & testing instructions reviewed. Advised pt to start Flecainide 100 mg BID on 09/17/18. Patient verbalized understanding and agreeable to plan. Pt also understands to take his Flecainide day of GXT testing.

## 2018-09-10 ENCOUNTER — Telehealth: Payer: Self-pay | Admitting: *Deleted

## 2018-09-10 DIAGNOSIS — R002 Palpitations: Secondary | ICD-10-CM | POA: Diagnosis not present

## 2018-09-10 DIAGNOSIS — J018 Other acute sinusitis: Secondary | ICD-10-CM | POA: Diagnosis not present

## 2018-09-10 NOTE — Telephone Encounter (Signed)
-----   Message from Damian Leavell, RN sent at 09/10/2018  2:10 PM EST ----- Regarding: DOD call Received call from PCP.  Dr. Rayann Heman advised her she should go ahead and have Pt start taking flecainide  Sonia Baller

## 2018-09-13 DIAGNOSIS — J018 Other acute sinusitis: Secondary | ICD-10-CM | POA: Diagnosis not present

## 2018-09-13 DIAGNOSIS — R05 Cough: Secondary | ICD-10-CM | POA: Diagnosis not present

## 2018-09-17 ENCOUNTER — Telehealth: Payer: Self-pay | Admitting: Cardiology

## 2018-09-17 NOTE — Telephone Encounter (Signed)
Spoke with patient and advised for him to wait until he is finished with his antibiotic before starting flecainide. Patient verbalized understanding and reports that he is scheduled for an exercise tolerance test on 09/25/2018 per Dr. Curt Bears and wanted to know if this would need to be rescheduled. I have reached out to Dr. Macky Lower RN, Sherri, to find out.  Advised patient he would be contacted if testing needed to be rescheduled. Patient verbalized understanding.

## 2018-09-17 NOTE — Telephone Encounter (Signed)
Sherri, RN verified that patient needs to reschedule his ETT until after starting flecainide. Patient will need to be on this medication for 7-10 days before the ETT. Patient will be finished with his antibiotics on 09/23/2018 and will start flecainide on 09/24/2018. Patient plans on rescheduling ETT anywhere between 10/01/18-10/03/2018. Patient provided with the scheduling department's phone number and will call today to reschedule. Advised patient to contact our office with any further questions.

## 2018-09-17 NOTE — Telephone Encounter (Signed)
Patient is supposed to start taking flecainide 100mg  today but paperwork says to not take with other medications so they are wanting to make sure this is correct. Please advise.

## 2018-09-17 NOTE — Telephone Encounter (Signed)
I do not think that there is any interaction but let us wait until he stops his antibiotic.  I suspect this was a warning for people were taking other antibiotics like Cipro they do have some effects on heart rhythm was just wait until he is done

## 2018-09-17 NOTE — Telephone Encounter (Signed)
Spoke with patient's wife, Arville Go. Patient is supposed to start taking flecainide today. When this prescription was picked up from the pharmacy, additional information was given to them to review. Joann read that flecainide should not be taken with antibiotics. Patient just started taking cefdinir. Arville Go wants to know if patient can start flecainide while taking cefdinir. Will have Dr. Bettina Gavia advise.

## 2018-09-22 ENCOUNTER — Emergency Department (HOSPITAL_BASED_OUTPATIENT_CLINIC_OR_DEPARTMENT_OTHER): Payer: Medicare Other

## 2018-09-22 ENCOUNTER — Emergency Department (HOSPITAL_BASED_OUTPATIENT_CLINIC_OR_DEPARTMENT_OTHER)
Admission: EM | Admit: 2018-09-22 | Discharge: 2018-09-22 | Disposition: A | Discharge status: Home / Self Care | Payer: Medicare Other | Attending: Emergency Medicine | Admitting: Emergency Medicine

## 2018-09-22 ENCOUNTER — Other Ambulatory Visit: Payer: Self-pay

## 2018-09-22 ENCOUNTER — Encounter (HOSPITAL_BASED_OUTPATIENT_CLINIC_OR_DEPARTMENT_OTHER): Payer: Self-pay

## 2018-09-22 DIAGNOSIS — J209 Acute bronchitis, unspecified: Secondary | ICD-10-CM | POA: Diagnosis not present

## 2018-09-22 DIAGNOSIS — R001 Bradycardia, unspecified: Secondary | ICD-10-CM | POA: Insufficient documentation

## 2018-09-22 DIAGNOSIS — I2510 Atherosclerotic heart disease of native coronary artery without angina pectoris: Secondary | ICD-10-CM | POA: Diagnosis not present

## 2018-09-22 DIAGNOSIS — R600 Localized edema: Secondary | ICD-10-CM | POA: Insufficient documentation

## 2018-09-22 DIAGNOSIS — I5032 Chronic diastolic (congestive) heart failure: Secondary | ICD-10-CM | POA: Diagnosis not present

## 2018-09-22 DIAGNOSIS — Z79899 Other long term (current) drug therapy: Secondary | ICD-10-CM | POA: Diagnosis not present

## 2018-09-22 DIAGNOSIS — R0602 Shortness of breath: Secondary | ICD-10-CM | POA: Diagnosis not present

## 2018-09-22 DIAGNOSIS — I110 Hypertensive heart disease with heart failure: Secondary | ICD-10-CM | POA: Diagnosis not present

## 2018-09-22 DIAGNOSIS — Z856 Personal history of leukemia: Secondary | ICD-10-CM | POA: Diagnosis not present

## 2018-09-22 DIAGNOSIS — R69 Illness, unspecified: Secondary | ICD-10-CM

## 2018-09-22 DIAGNOSIS — R05 Cough: Secondary | ICD-10-CM | POA: Diagnosis not present

## 2018-09-22 DIAGNOSIS — J111 Influenza due to unidentified influenza virus with other respiratory manifestations: Secondary | ICD-10-CM | POA: Diagnosis not present

## 2018-09-22 LAB — CBC WITH DIFFERENTIAL/PLATELET
ABS IMMATURE GRANULOCYTES: 0.02 10*3/uL (ref 0.00–0.07)
BASOS ABS: 0 10*3/uL (ref 0.0–0.1)
BASOS PCT: 0 %
EOS PCT: 5 %
Eosinophils Absolute: 0.1 10*3/uL (ref 0.0–0.5)
HCT: 37.9 % — ABNORMAL LOW (ref 39.0–52.0)
HEMOGLOBIN: 11.9 g/dL — AB (ref 13.0–17.0)
IMMATURE GRANULOCYTES: 1 %
LYMPHS ABS: 0.4 10*3/uL — AB (ref 0.7–4.0)
Lymphocytes Relative: 18 %
MCH: 29.5 pg (ref 26.0–34.0)
MCHC: 31.4 g/dL (ref 30.0–36.0)
MCV: 93.8 fL (ref 80.0–100.0)
Monocytes Absolute: 0.4 10*3/uL (ref 0.1–1.0)
Monocytes Relative: 15 %
NEUTROS ABS: 1.5 10*3/uL — AB (ref 1.7–7.7)
NEUTROS PCT: 61 %
Platelets: 49 10*3/uL — ABNORMAL LOW (ref 150–400)
RBC: 4.04 MIL/uL — AB (ref 4.22–5.81)
RDW: 14.5 % (ref 11.5–15.5)
SMEAR REVIEW: NORMAL
WBC: 2.4 10*3/uL — AB (ref 4.0–10.5)
nRBC: 0 % (ref 0.0–0.2)

## 2018-09-22 LAB — URINALYSIS, ROUTINE W REFLEX MICROSCOPIC
BILIRUBIN URINE: NEGATIVE
Glucose, UA: NEGATIVE mg/dL
Hgb urine dipstick: NEGATIVE
Ketones, ur: NEGATIVE mg/dL
Leukocytes, UA: NEGATIVE
Nitrite: NEGATIVE
PH: 6.5 (ref 5.0–8.0)
Protein, ur: NEGATIVE mg/dL
Specific Gravity, Urine: <1.005 — ABNORMAL LOW (ref 1.005–1.030)

## 2018-09-22 LAB — COMPREHENSIVE METABOLIC PANEL
ALBUMIN: 3.8 g/dL (ref 3.5–5.0)
ALK PHOS: 84 U/L (ref 38–126)
ALT: 21 U/L (ref 0–44)
AST: 30 U/L (ref 15–41)
Anion gap: 6 (ref 5–15)
BUN: 19 mg/dL (ref 8–23)
CHLORIDE: 108 mmol/L (ref 98–111)
CO2: 25 mmol/L (ref 22–32)
Calcium: 8.6 mg/dL — ABNORMAL LOW (ref 8.9–10.3)
Creatinine, Ser: 0.99 mg/dL (ref 0.61–1.24)
GLUCOSE: 104 mg/dL — AB (ref 70–99)
Potassium: 4.6 mmol/L (ref 3.5–5.1)
Sodium: 139 mmol/L (ref 135–145)
Total Bilirubin: 0.8 mg/dL (ref 0.3–1.2)
Total Protein: 6 g/dL — ABNORMAL LOW (ref 6.5–8.1)

## 2018-09-22 LAB — BRAIN NATRIURETIC PEPTIDE: B Natriuretic Peptide: 286 pg/mL — ABNORMAL HIGH (ref 0.0–100.0)

## 2018-09-22 LAB — I-STAT CG4 LACTIC ACID, ED: Lactic Acid, Venous: 0.7 mmol/L (ref 0.5–1.9)

## 2018-09-22 LAB — TROPONIN I: Troponin I: <0.03 ng/mL (ref <0.03)

## 2018-09-22 MED ORDER — ALBUTEROL SULFATE HFA 108 (90 BASE) MCG/ACT IN AERS
2.0000 | INHALATION_SPRAY | Freq: Once | RESPIRATORY_TRACT | Status: AC
  Administered 2018-09-22: 2 via RESPIRATORY_TRACT
  Filled 2018-09-22: qty 6.7

## 2018-09-22 MED ORDER — IPRATROPIUM-ALBUTEROL 0.5-2.5 (3) MG/3ML IN SOLN
3.0000 mL | Freq: Four times a day (QID) | RESPIRATORY_TRACT | Status: DC
  Administered 2018-09-22: 3 mL via RESPIRATORY_TRACT
  Filled 2018-09-22: qty 3

## 2018-09-22 NOTE — ED Provider Notes (Signed)
[facility] EMERGENCY DEPARTMENT Provider Note   CSN: [id number] Arrival date & time: 09/22/18  1342     History   Chief Complaint Chief Complaint  Patient presents with  . Influenza    HPI Jesse [name] is a 78 y.o. male.  HPI Patient reports he has been sick for 12 days at least.  Started with cough and congestion.  He reports that he has also had body aches and some low-grade fever.  Patient states that he was started first on amoxicillin but did not get any improvement.  He was seen by his doctor several days later and given Cefdinir.  He continued to take that but still is not making any improvement.  He reports he is coughing quite a bit and he feels short of breath.  Reports he feels very fatigued and has had loss of energy.  He reports he has continued to eat and drink.  He has not had loss of appetite.  He has not had vomiting.  He has had some diarrhea.  He chronically has quite a bit of swelling in his legs and that continues to be the case.  Patient has history of congestive heart failure.  Reports he takes Lasix for swelling in his legs and fluid overload. Past Medical History:  Diagnosis Date  . Anemia    takes Ferrous Sulfate daily  . Anxiety    takes Xanax daily  . Arthritis   . BPH (benign prostatic hyperplasia) 10/25/2017  . Bradycardia 10/25/2017  . CAD (coronary artery disease) 10/25/2017  . Chest pain 10/25/2017  . Chronic back pain   . Chronic ITP (idiopathic thrombocytopenia) (HCC) 08/08/2013  . Chronic lymphocytic leukemia ([place]) 10/25/2017  . Constipation    takes Colace daily as needed  . Dry eyes    eye drops as needed  . Dyspnea on exertion 10/25/2017  . History of blood transfusion    no abnormal reaction noted  . History of kidney stones   . Hyperlipemia 10/25/2017  . Hyperlipidemia    takes Pravastatin daily  . Infusion reaction 4 yrs ago   platelet infusion broke out head to toe  . Insomnia    takes trazodone nightly as needed  . Leukemia  ([place])   . Low back pain 12/12/2012  . Lumbar radiculopathy 01/09/2013  . Muscle spasm    takes Robaxin daily as needed  . Nerve pain    takes Gabapentin daily   . Nocturia   . Renal stones 10/25/2017  . Status post lumbar surgery 12/02/2015  . Thrombocytopenia, secondary 10/25/2017   Overview:  secondary to CLL  . Vitamin B12 deficiency    takes Vit B12 daily  . Weakness    numbness and tingling    Patient Active Problem List   Diagnosis Date Noted  . PVC's (premature ventricular contractions) 06/20/2018  . Mild aortic stenosis 12/12/2017  . Chronic diastolic heart failure ([place]) 11/14/2017  . Elevated brain natriuretic peptide (BNP) level 11/14/2017  . Edema 11/14/2017  . Vasculitis determined by biopsy of skin ([place]) 10/26/2017  . Anxiety 10/25/2017  . Chronic lymphocytic leukemia ([place]) 10/25/2017  . Hyperlipemia 10/25/2017  . Bradycardia 10/25/2017  . Chest pain 10/25/2017  . Dyspnea on exertion 10/25/2017  . CAD (coronary artery disease) 10/25/2017  . BPH (benign prostatic hyperplasia) 10/25/2017  . Renal stones 10/25/2017  . Status post lumbar surgery 12/02/2015  . CLL (chronic lymphocytic leukemia) ([place]) 08/08/2013  . Lumbar radiculopathy 01/09/2013  . Low back pain 12/12/2012  Past Surgical History:  Procedure Laterality Date  . ANTERIOR LAT LUMBAR FUSION N/A 12/02/2015   Procedure: L3-4 ANTERIOR LATERAL LUMBAR FUSION w/lateral plate;  Surgeon: Eustace Moore, MD;  Location: [facility];  Service: Neurosurgery;  Laterality: N/A;  L3-4 ANTERIOR LATERAL LUMBAR FUSION w/lateral plate  . BACK SURGERY  2012   fusion x 2  . CATARACT EXTRACTION    . CERVICAL FUSION  1988   x 2  . COLONOSCOPY    . FOOT SURGERY    . HERNIA REPAIR Left    inguinal  . KNEE SURGERY    . LAMINECTOMY    . plates/screws/rods removed  2015  . TOE SURGERY    . TRANSURETHRAL RESECTION OF PROSTATE          Home Medications    Prior to Admission medications   Medication Sig Start Date  End Date Taking? Authorizing Provider  ALPRAZolam Duanne Moron) 0.5 MG tablet Take 0.5 mg by mouth 3 (three) times daily.    [provider]  b complex vitamins tablet Take 1 tablet by mouth daily.     [provider]  flecainide (TAMBOCOR) 100 MG tablet Take 1 tablet (100 mg total) by mouth 2 (two) times daily. 09/17/18   Camnitz, Ocie Doyne, MD  FOLIC ACID PO Take 1 tablet by mouth daily.    [provider]  furosemide (LASIX) 40 MG tablet Take 0.5 tablets (20 mg total) by mouth daily. Increase to 40 mg if weight is up 3 lbs from your baseline Patient taking differently: Take 40 mg by mouth daily. Increase to 40 mg if weight is up 3 lbs from your baseline 12/12/17 06/21/19  Richardo Priest, MD  gabapentin (NEURONTIN) 600 MG tablet Take 600 mg by mouth 3 (three) times daily.    [provider]  Homeopathic Products ([employer] LEG CRAMPS PAIN RELIEF PO) Take 1-2 tablets by mouth at bedtime as needed.    [provider]  levothyroxine (SYNTHROID, LEVOTHROID) 100 MCG tablet Take 100 mcg by mouth daily before breakfast.     [provider]  methocarbamol (ROBAXIN) 750 MG tablet Take 750 mg by mouth every 6 (six) hours as needed for muscle spasms.    [provider]  Methotrexate, Anti-Rheumatic, (METHOTREXATE, PF, [place]) Inject 0.6 mLs into the skin once a week.    [provider]  oxyCODONE-acetaminophen (PERCOCET) 10-325 MG tablet Take 1 tablet by mouth every 6 (six) hours as needed for pain. 12/03/15   Eustace Moore, MD  pravastatin (PRAVACHOL) 80 MG tablet Take 80 mg by mouth daily.    [provider]  Probiotic Product (PROBIOTIC DAILY PO) Take 1 capsule by mouth daily.    [provider]  Tetrahydrozoline-PEG (EYE DROPS EXTRA OP) Apply 1-2 drops to eye as needed.    [provider]  traZODone (DESYREL) 50 MG tablet Take 100 mg by mouth at bedtime as needed for sleep.     [provider]  vitamin B-12  (CYANOCOBALAMIN) 1000 MCG tablet Take 1,000 mcg by mouth daily.    [provider]  vitamin C (ASCORBIC ACID) 500 MG tablet Take 500 mg by mouth daily.     [provider]    Family History Family History  Problem Relation Age of Onset  . CAD Father   . Congestive Heart Failure Brother   . CAD Brother   . Leukemia Brother   . Prostate cancer Brother   . Kidney failure Brother   .  Throat cancer Mother     Social History Social History   Tobacco Use  . Smoking status: Never Smoker  . Smokeless tobacco: Never Used  Substance Use Topics  . Alcohol use: No  . Drug use: No     Allergies   Patient has no known allergies.   Review of Systems Review of Systems 10 Systems reviewed and are negative for acute change except as noted in the HPI.   Physical Exam Updated Vital Signs BP 119/65   Pulse (!) 46   Temp 98.4 F (36.9 C) (Oral)   Resp 17   Ht 5\' 9"  (1.753 m)   Wt 83.9 kg   SpO2 96%   BMI 27.32 kg/m   Physical Exam Constitutional:      Comments: Patient is alert and appropriate.  No respiratory distress at rest.  Occasional wet sounding cough.  HENT:     Head: Normocephalic and atraumatic.     Mouth/Throat:     Mouth: Mucous membranes are moist.  Eyes:     Extraocular Movements: Extraocular movements intact.  Cardiovascular:     Comments: Bradycardia.  2\6 systolic ejection murmur. Pulmonary:     Comments: Symmetric breath sounds.  Intermittent expiratory wheeze. Abdominal:     Palpations: Abdomen is soft.     Tenderness: There is no abdominal tenderness. There is no guarding.  Musculoskeletal:     Comments: 2+ pitting edema bilateral lower extremities.  Calf soft and nontender.  Skin:    General: Skin is warm and dry.  Neurological:     General: No focal deficit present.     Mental Status: He is oriented to person, place, and time.     Coordination: Coordination normal.  Psychiatric:        Mood and Affect: Mood normal.       ED Treatments / Results  Labs (all labs ordered are listed, but only abnormal results are displayed) Labs Reviewed  COMPREHENSIVE METABOLIC PANEL - Abnormal; Notable for the following components:      Result Value   Glucose, Bld 104 (*)    Calcium 8.6 (*)    Total Protein 6.0 (*)    All other components within normal limits  BRAIN NATRIURETIC PEPTIDE - Abnormal; Notable for the following components:   B Natriuretic Peptide 286.0 (*)    All other components within normal limits  CBC WITH DIFFERENTIAL/PLATELET - Abnormal; Notable for the following components:   WBC 2.4 (*)    RBC 4.04 (*)    Hemoglobin 11.9 (*)    HCT 37.9 (*)    Platelets 49 (*)    Neutro Abs 1.5 (*)    Lymphs Abs 0.4 (*)    All other components within normal limits  URINALYSIS, ROUTINE W REFLEX MICROSCOPIC - Abnormal; Notable for the following components:   Specific Gravity, Urine <1.005 (*)    All other components within normal limits  TROPONIN I  I-STAT CG4 LACTIC ACID, ED  I-STAT CG4 LACTIC ACID, ED    EKG EKG Interpretation  Date/Time:  Saturday September 22 2018 14:18:50 EST Ventricular Rate:  68 PR Interval:  172 QRS Duration: 112 QT Interval:  428 QTC Calculation: 455 R Axis:   -30 Text Interpretation:  Sinus rhythm with frequent Premature ventricular complexes in a pattern of bigeminy Left axis deviation Minimal voltage criteria for LVH, may be normal variant Septal infarct , age undetermined Abnormal ECG no change from previous Confirmed by Charlesetta Shanks [phone]) on 09/22/2018 4:17:16 PM  Radiology Dg Chest 2 View  Result Date: 09/22/2018 CLINICAL DATA:  Cough and congestion. EXAM: CHEST - 2 VIEW COMPARISON:  09/13/2018 FINDINGS: The heart size and mediastinal contours are within normal limits. Both lungs are clear. The visualized skeletal structures are unremarkable. IMPRESSION: No active cardiopulmonary disease. Electronically Signed   By: Kerby Moors M.D.   On: 09/22/2018 16:54     Procedures Procedures (including critical care time)  Medications Ordered in ED Medications  ipratropium-albuterol (DUONEB) 0.5-2.5 (3) MG/3ML nebulizer solution 3 mL (3 mLs Nebulization Given 09/22/18 1651)  albuterol (PROVENTIL HFA;VENTOLIN HFA) 108 (90 Base) MCG/ACT inhaler 2 puff (has no administration in time range)     Initial Impression / Assessment and Plan / ED Course  I have reviewed the triage vital signs and the nursing notes.  Pertinent labs & imaging results that were available during my care of the patient were reviewed by me and considered in my medical decision making (see chart for details).    Patient presents with persistent cough for almost 2 weeks.  Symptoms started with coryza-like prodrome.  Findings are consistent with ongoing bronchitis.  Patient does have wheezing.  Has some peripheral edema and mild elevation in BNP but no significant rales or severe congestion on chest x-ray.  I have much higher suspicion for viral syndrome as opposed to CHF exacerbation.  Patient felt improvement with nebulizer treatments.  Have him continue inhalers at home.  She is nontoxic and alert.  Vital signs are stable.  Return precautions reviewed.  Final Clinical Impressions(s) / ED Diagnoses   Final diagnoses:  Influenza-like illness  Bronchitis, acute, with bronchospasm    ED Discharge Orders    None       Charlesetta Shanks, MD 09/22/18 [phone]

## 2018-09-22 NOTE — ED Notes (Signed)
Pt stated that he felt tightness on his chest and wants to know that he has no pneumonia,  He was on Cefdinir antibiotic with no relief.

## 2018-09-22 NOTE — Discharge Instructions (Signed)
1.  Use the albuterol inhaler every 4-6 hours as needed for wheezing. 2.  You may take an extra Lasix dose for the next 2 to 3 days. 3.  Your family doctor for recheck within the next 2 to 3 days. 4.  Return to the emergency department if her symptoms are worsening or changing.

## 2018-09-22 NOTE — ED Triage Notes (Addendum)
Pt c/o cough, TMax 100, body aches, sore thorat, URI symptoms x 12 days. Pt was evaluated last Thursday by PCP and had a negative CXR. Pt was given an abx for these symptoms.

## 2018-09-22 NOTE — ED Notes (Signed)
Pt on cardiac monitor and auto VS 

## 2018-09-26 DIAGNOSIS — J208 Acute bronchitis due to other specified organisms: Secondary | ICD-10-CM | POA: Diagnosis not present

## 2018-10-10 ENCOUNTER — Telehealth: Payer: Self-pay | Admitting: *Deleted

## 2018-10-10 DIAGNOSIS — Z79899 Other long term (current) drug therapy: Secondary | ICD-10-CM

## 2018-10-10 DIAGNOSIS — I493 Ventricular premature depolarization: Secondary | ICD-10-CM

## 2018-10-10 NOTE — Telephone Encounter (Signed)
Spoke to pt's wife yesterday and informed her that we needed to reschedule GXT b/c it was incorrectly scheduled to begin with.  Wife agreeable but reports that pt will probably not be able to walk far at all on the treadmill.  States he has back issues that limit him and that he is very unsteady at the moment. Discussed w/ Camnitz yesterday and new order to change GXT to [facility].  Wife understands office will call to arrange myoveiw study.

## 2018-10-15 ENCOUNTER — Telehealth: Payer: Self-pay | Admitting: Cardiology

## 2018-10-15 ENCOUNTER — Telehealth (HOSPITAL_COMMUNITY): Payer: Self-pay | Admitting: *Deleted

## 2018-10-15 NOTE — Telephone Encounter (Signed)
Left message on voicemail per DPR in reference to upcoming appointment scheduled on 10/17/18 with detailed instructions given per Myocardial Perfusion Study Information Sheet for the test. LM to arrive 15 minutes early, and that it is imperative to arrive on time for appointment to keep from having the test rescheduled. If you need to cancel or reschedule your appointment, please call the office within 24 hours of your appointment. Failure to do so may result in a cancellation of your appointment, and a $50 no show fee. Phone number given for call back for any questions. Michoel Kunin Jacqueline    

## 2018-10-15 NOTE — Telephone Encounter (Addendum)
Discussed/reviewed w/  Dr. Curt Bears. Advised wife (pt in the background listening and participating in conversation) to have pt stop Flecainide, per [place]. Informed myoview testing scheduled for Wednesday cancelled.  Aware that Dr. Curt Bears will review other options. Wife aware I will follow up with them later this week w/ new medication recommendation.

## 2018-10-15 NOTE — Telephone Encounter (Signed)
° ° °  STAT if patient feels like he/she is going to faint   1) Are you dizzy now? YES  2) Do you feel faint or have you passed out? NO  3) Do you have any other symptoms? LEG PAIN  4) Have you checked your HR and BP (record if available)? N/A   Patient states he feels flecainide (TAMBOCOR) 100 MG tablet is making him dizzy   Pt c/o medication issue:  1. Name of Medication: flecainide (TAMBOCOR) 100 MG tablet  2. How are you currently taking this medication (dosage and times per day)? As written  3. Are you having a reaction (difficulty breathing--STAT)? no  4. What is your medication issue? Patient states he wants to discontinue taking due to dizziness

## 2018-10-17 ENCOUNTER — Encounter (HOSPITAL_COMMUNITY): Payer: Medicare Other

## 2018-10-17 NOTE — Telephone Encounter (Signed)
Start 225 mg rhythmol BID. If he cannot tolerate, may need sotalol.

## 2018-10-18 MED ORDER — PROPAFENONE HCL 225 MG PO TABS: 225.0000 mg | ORAL_TABLET | Freq: Two times a day (BID) | ORAL | 2 refills | Status: DC

## 2018-10-18 NOTE — Telephone Encounter (Signed)
Advised to start Propafenone 225 mg BID per Dr. Curt Bears.  Wife reports (pt speaking in the background) that pt would like to have an ablation b/c he doesn't want to feel this way anymore (r/t PVCs and his other health issues). Made aware I would have [place] review and call them by next week with his recommendation.

## 2018-10-24 NOTE — Telephone Encounter (Signed)
Apologized for not following up sooner.  Informed that I had not had Dr. Curt Bears look at this yet and it was my fault. Made aware that Dr. Curt Bears wants to review pt's chart before deciding med vs ablation or both. Wife aware I will follow up on Friday.

## 2018-10-25 NOTE — Telephone Encounter (Signed)
OK for ablation.

## 2018-10-26 NOTE — Telephone Encounter (Signed)
Informed pt who tells me that currently his PLT count is 49 and understands procedure cannot be scheduled at this time. Discussed w/ Camnitz.  Informed pt to keep appt on 3/16 in [place] and we will re-address scheduling a procedure at that time.  Pt understands to continue Propafenone. Wife agreeable to plan.

## 2018-10-29 ENCOUNTER — Telehealth: Payer: Self-pay | Admitting: Cardiology

## 2018-10-29 NOTE — Telephone Encounter (Signed)
New Message   Patients wife is calling in reference to the patients platelets since he has leukemia. They are wondering what Dr. Curt Bears would like his platelets count to be at prior to the ablation. Please call to discuss. They will not be available between 1:15pm and 3:30pm.

## 2018-10-29 NOTE — Telephone Encounter (Signed)
Informed that plt count needs to be at least in the 80s, but Camnitz would prefer 100 or above. Aware that I have held 3/18 procedure date in anticipation that his plt count improves prior to.

## 2018-11-05 DIAGNOSIS — D693 Immune thrombocytopenic purpura: Secondary | ICD-10-CM | POA: Diagnosis not present

## 2018-11-05 DIAGNOSIS — C9110 Chronic lymphocytic leukemia of B-cell type not having achieved remission: Secondary | ICD-10-CM | POA: Diagnosis not present

## 2018-11-08 DIAGNOSIS — D693 Immune thrombocytopenic purpura: Secondary | ICD-10-CM | POA: Diagnosis not present

## 2018-11-08 DIAGNOSIS — C9110 Chronic lymphocytic leukemia of B-cell type not having achieved remission: Secondary | ICD-10-CM | POA: Diagnosis not present

## 2018-11-12 DIAGNOSIS — D693 Immune thrombocytopenic purpura: Secondary | ICD-10-CM | POA: Diagnosis not present

## 2018-11-12 DIAGNOSIS — C9110 Chronic lymphocytic leukemia of B-cell type not having achieved remission: Secondary | ICD-10-CM | POA: Diagnosis not present

## 2018-11-19 DIAGNOSIS — D693 Immune thrombocytopenic purpura: Secondary | ICD-10-CM | POA: Diagnosis not present

## 2018-11-19 DIAGNOSIS — C9110 Chronic lymphocytic leukemia of B-cell type not having achieved remission: Secondary | ICD-10-CM | POA: Diagnosis not present

## 2018-11-20 ENCOUNTER — Telehealth: Payer: Self-pay | Admitting: Cardiology

## 2018-11-20 NOTE — Telephone Encounter (Signed)
New Message   Patient's wife states the doctor wanted patient's platelets to be over 80 to perform ablation and patient's platelets are currently at 43.

## 2018-11-21 ENCOUNTER — Encounter: Payer: Self-pay | Admitting: *Deleted

## 2018-11-21 NOTE — Telephone Encounter (Signed)
Reviewed procedure instructions w/ wife. Made aware letter of instructions sent via [place]. H&P appt scheduled for 3/16 in [place]. Wife verbalized understanding and agreeable to plan.

## 2018-11-21 NOTE — Telephone Encounter (Signed)
Informed wife that I would schedule procedure.  Scheduling for 3/19 (not 3/18 as originally agreed). Wife aware I will arrange and call her back soon.

## 2018-11-21 NOTE — Telephone Encounter (Signed)
Pt tells me his wife has gone to the [facility] and will be back after 3/330.  He asks me to call back after that to go over procedure instructions.

## 2018-11-26 DIAGNOSIS — C9110 Chronic lymphocytic leukemia of B-cell type not having achieved remission: Secondary | ICD-10-CM | POA: Diagnosis not present

## 2018-11-26 DIAGNOSIS — D693 Immune thrombocytopenic purpura: Secondary | ICD-10-CM | POA: Diagnosis not present

## 2018-12-03 ENCOUNTER — Telehealth: Payer: Self-pay | Admitting: *Deleted

## 2018-12-03 ENCOUNTER — Ambulatory Visit: Payer: Medicare HMO | Admitting: Cardiology

## 2018-12-03 NOTE — Telephone Encounter (Signed)
Called pt to inform them that we were cancelling his PVC ablation scheduled for this Thursday, 3/19 d/t COVID-19. Made aware office will call him back at a later date to reschedule procedure. Pt advised to call the office if he experiences any issues w/ PVCs. Wife agreeable to plan.

## 2018-12-06 ENCOUNTER — Ambulatory Visit (HOSPITAL_COMMUNITY): Admit: 2018-12-06 | Payer: Medicare Other | Admitting: Cardiology

## 2018-12-06 ENCOUNTER — Encounter (HOSPITAL_COMMUNITY): Payer: Self-pay

## 2018-12-06 SURGERY — PVC ABLATION
Anesthesia: General

## 2018-12-12 ENCOUNTER — Telehealth: Payer: Self-pay | Admitting: Cardiology

## 2018-12-12 ENCOUNTER — Encounter: Payer: Medicare Other | Admitting: Cardiology

## 2018-12-12 DIAGNOSIS — M545 Low back pain: Secondary | ICD-10-CM | POA: Diagnosis not present

## 2018-12-12 DIAGNOSIS — I495 Sick sinus syndrome: Secondary | ICD-10-CM | POA: Diagnosis not present

## 2018-12-12 NOTE — Telephone Encounter (Signed)
New Message         Dr. Tobie Poet is needing a call back from Dr. Baird Kay regarding patient as soon as he is done with the procedure. Call [phone]

## 2018-12-12 NOTE — Telephone Encounter (Signed)
Wife aware Dr. Curt Bears will call tomorrow to discuss.

## 2018-12-12 NOTE — Telephone Encounter (Signed)
Dr. Tobie Poet returns my call. She reports pt "feels exhausted", he is sleeping 20 hours a day, has no energy, HR 30-60, BP 120/84. Last plt count 128, but she isn't sure date of that plt count Made aware that I would have Dr. Curt Bears call pt to discuss

## 2018-12-12 NOTE — Telephone Encounter (Signed)
Informed Dr. Tobie Poet that procedure has been cancelled secondary to pandemic. Dr. Tobie Poet had urgent need and will call me back to discuss.  States pt is bradycardic and we may need to re-address procedure urgency.

## 2018-12-13 ENCOUNTER — Encounter: Payer: Self-pay | Admitting: Cardiology

## 2018-12-13 ENCOUNTER — Telehealth (INDEPENDENT_AMBULATORY_CARE_PROVIDER_SITE_OTHER): Payer: Medicare Other | Admitting: Cardiology

## 2018-12-13 DIAGNOSIS — I493 Ventricular premature depolarization: Secondary | ICD-10-CM | POA: Diagnosis not present

## 2018-12-13 MED ORDER — MEXILETINE HCL 150 MG PO CAPS: 150.0000 mg | ORAL_CAPSULE | Freq: Two times a day (BID) | ORAL | 3 refills | Status: DC

## 2018-12-13 NOTE — Telephone Encounter (Signed)
This encounter was created in error - please disregard.

## 2018-12-13 NOTE — Telephone Encounter (Signed)
Dr. Curt Bears spoke to patient. Order to start Mexiletine 150 BID. Pt agreeable. Rx sent to [facility] per request

## 2018-12-14 ENCOUNTER — Encounter: Payer: Self-pay | Admitting: Cardiology

## 2018-12-14 NOTE — Progress Notes (Signed)
Electrophysiology TeleHealth Note   Due to national recommendations of social distancing due to COVID 19, an audio/video telehealth visit is felt to be most appropriate for this patient at this time.  The patient was consented to a telephone visit via verbal consent.  Date:  12/14/2018   ID:  Jesse [name], DOB 04/21/1941, MRN [id number]  Location: patient's home  Provider location: [address] [place]  Evaluation Performed: Follow-up visit  PCP:  Rochel Brome, MD  Cardiologist:  Bettina Gavia Electrophysiologist:  Curt Bears   Chief Complaint:  PVC  History of Present Illness:    Jesse [name] is a 78 y.o. male who presents via audio/video conferencing for a telehealth visit today.  Since last being seen in our clinic, the patient reports doing very well.  Today, he denies symptoms of palpitations, chest pain, shortness of breath,  lower extremity edema, dizziness, presyncope, or syncope.  The patient is otherwise without complaint today.  The patient denies symptoms of fevers, chills, cough, or new SOB worrisome for COVID 19.   He has a history of PVCs.  He wore a cardiac monitor that showed 4 to 6% PVCs.  Since that time, he has had worsening symptoms of fatigue and weakness.  He says that at times his heart rates get down into the 30s when he takes his pulse.  He has been sleeping quite a bit more and is unable to do all of his daily activities due to significant fatigue.  He attributes his level of fatigue to PVCs.  Past Medical History:  Diagnosis Date  . Anemia    takes Ferrous Sulfate daily  . Anxiety    takes Xanax daily  . Arthritis   . BPH (benign prostatic hyperplasia) 10/25/2017  . Bradycardia 10/25/2017  . CAD (coronary artery disease) 10/25/2017  . Chest pain 10/25/2017  . Chronic back pain   . Chronic ITP (idiopathic thrombocytopenia) (HCC) 08/08/2013  . Chronic lymphocytic leukemia ([place]) 10/25/2017  . Constipation    takes Colace daily as needed  . Dry eyes     eye drops as needed  . Dyspnea on exertion 10/25/2017  . History of blood transfusion    no abnormal reaction noted  . History of kidney stones   . Hyperlipemia 10/25/2017  . Hyperlipidemia    takes Pravastatin daily  . Infusion reaction 4 yrs ago   platelet infusion broke out head to toe  . Insomnia    takes trazodone nightly as needed  . Leukemia ([place])   . Low back pain 12/12/2012  . Lumbar radiculopathy 01/09/2013  . Muscle spasm    takes Robaxin daily as needed  . Nerve pain    takes Gabapentin daily   . Nocturia   . Renal stones 10/25/2017  . Status post lumbar surgery 12/02/2015  . Thrombocytopenia, secondary 10/25/2017   Overview:  secondary to CLL  . Vitamin B12 deficiency    takes Vit B12 daily  . Weakness    numbness and tingling    Past Surgical History:  Procedure Laterality Date  . ANTERIOR LAT LUMBAR FUSION N/A 12/02/2015   Procedure: L3-4 ANTERIOR LATERAL LUMBAR FUSION w/lateral plate;  Surgeon: Eustace Moore, MD;  Location: [facility];  Service: Neurosurgery;  Laterality: N/A;  L3-4 ANTERIOR LATERAL LUMBAR FUSION w/lateral plate  . BACK SURGERY  2012   fusion x 2  . CATARACT EXTRACTION    . CERVICAL FUSION  1988   x 2  . COLONOSCOPY    .  FOOT SURGERY    . HERNIA REPAIR Left    inguinal  . KNEE SURGERY    . LAMINECTOMY    . plates/screws/rods removed  2015  . TOE SURGERY    . TRANSURETHRAL RESECTION OF PROSTATE      Current Outpatient Medications  Medication Sig Dispense Refill  . ALPRAZolam (XANAX) 0.5 MG tablet Take 0.5 mg by mouth 3 (three) times daily.    Marland Kitchen b complex vitamins tablet Take 1 tablet by mouth daily.     . folic acid (FOLVITE) 1 MG tablet Take 1 mg by mouth daily.     . furosemide (LASIX) 40 MG tablet Take 0.5 tablets (20 mg total) by mouth daily. Increase to 40 mg if weight is up 3 lbs from your baseline (Patient taking differently: Take 40 mg by mouth daily. Increase to 40 mg if weight is up 3 lbs from your baseline) 90 tablet 3  .  gabapentin (NEURONTIN) 600 MG tablet Take 600 mg by mouth 3 (three) times daily.    . Homeopathic Products ([employer] LEG CRAMPS PAIN RELIEF PO) Take 1-2 tablets by mouth at bedtime as needed.    Marland Kitchen levothyroxine (SYNTHROID, LEVOTHROID) 88 MCG tablet Take 88 mcg by mouth daily before breakfast.     . magnesium oxide (MAG-OX) 400 MG tablet Take 400 mg by mouth at bedtime.    . methocarbamol (ROBAXIN) 750 MG tablet Take 750 mg by mouth 3 (three) times daily.     . Methotrexate, Anti-Rheumatic, (METHOTREXATE, PF, [place]) Inject 0.6 mLs into the skin once a week.    . mexiletine (MEXITIL) 150 MG capsule Take 1 capsule (150 mg total) by mouth 2 (two) times daily. 60 capsule 3  . oxyCODONE-acetaminophen (PERCOCET) 10-325 MG tablet Take 1 tablet by mouth every 6 (six) hours as needed for pain. 60 tablet 0  . pravastatin (PRAVACHOL) 80 MG tablet Take 80 mg by mouth daily.    . Probiotic Product (PROBIOTIC DAILY PO) Take 1 capsule by mouth daily.    . Tetrahydrozoline-PEG (EYE DROPS EXTRA OP) Apply 1-2 drops to eye as needed.    . traZODone (DESYREL) 50 MG tablet Take 50-100 mg by mouth at bedtime as needed for sleep.      No current facility-administered medications for this visit.     Allergies:   Patient has no known allergies.   Social History:  The patient  reports that he has never smoked. He has never used smokeless tobacco. He reports that he does not drink alcohol or use drugs.   Family History:  The patient's  family history includes CAD in his brother and father; Congestive Heart Failure in his brother; Kidney failure in his brother; Leukemia in his brother; Prostate cancer in his brother; Throat cancer in his mother.   ROS:  Please see the history of present illness.   All other systems are personally reviewed and negative.    Exam:    Vital Signs:  BP 131/75   Pulse (!) 48    No signs of distress via telephone call.  Currently not short of breath.   Labs/Other Tests and Data Reviewed:     Recent Labs: 09/22/2018: ALT 21; B Natriuretic Peptide 286.0; BUN 19; Creatinine, Ser 0.99; Hemoglobin 11.9; Platelets 49; Potassium 4.6; Sodium 139   Wt Readings from Last 3 Encounters:  09/22/18 185 lb (83.9 kg)  08/21/18 180 lb 6.4 oz (81.8 kg)  06/20/18 184 lb (83.5 kg)     Other studies personally  reviewed: Additional studies/ records that were reviewed today include: ECG 09/24/18 personally reviewed Review of the above records today demonstrates:  Sinus rhythm, ventricular bigeminy    ASSESSMENT & PLAN:    1.  PVCs: Currently feeling poorly which appears to be due to his high burden of PVCs.  Due to that, we Darnita Woodrum start him on mexiletine.  He has not tolerated flecainide or propafenone.  I Meryle Pugmire to try to avoid amiodarone as he would be a good candidate for ablation as his PVCs appear to be outflow tract related.  He Karenna Romanoff call us back if he is not tolerate this medication.   COVID 19 screen The patient denies symptoms of COVID 19 at this time.  The importance of social distancing was discussed today.  Follow-up:  months   Current medicines are reviewed at length with the patient today.   The patient does not have concerns regarding his medicines.  The following changes were made today: Start mexiletine 150 mg twice a day  Labs/ tests ordered today include:  No orders of the defined types were placed in this encounter.    Patient Risk:  after full review of this patients clinical status, I feel that they are at moderate risk at this time.  Today, I have spent 15 minutes with the patient with telehealth technology discussing PVCs .    Signed, Shaleigh Laubscher Meredith Leeds, MD  12/14/2018 1:40 PM     [address] [place] [address] [phone] (office) [phone] (fax)

## 2019-02-01 ENCOUNTER — Telehealth: Payer: Self-pay | Admitting: Cardiology

## 2019-02-01 ENCOUNTER — Other Ambulatory Visit: Payer: Self-pay | Admitting: Cardiology

## 2019-02-01 DIAGNOSIS — R7989 Other specified abnormal findings of blood chemistry: Secondary | ICD-10-CM

## 2019-02-01 MED ORDER — FUROSEMIDE 40 MG PO TABS: 20.0000 mg | ORAL_TABLET | Freq: Every day | ORAL | 1 refills | Status: DC

## 2019-02-01 MED ORDER — MEXILETINE HCL 150 MG PO CAPS: 150.0000 mg | ORAL_CAPSULE | Freq: Two times a day (BID) | ORAL | 3 refills | Status: DC

## 2019-02-01 NOTE — Telephone Encounter (Signed)
Rx refill sent to pharmacy. 

## 2019-02-01 NOTE — Telephone Encounter (Signed)
°*  STAT* If patient is at the pharmacy, call can be transferred to refill team.   1. Which medications need to be refilled? (please list name of each medication and dose if known) Furosemide   2. Which pharmacy/location (including street and city if local pharmacy) is medication to be sent to?[facility] mail order  3. Do they need a 30 day or 90 day supply? 14  Sent in by fax

## 2019-02-04 ENCOUNTER — Other Ambulatory Visit: Payer: Self-pay

## 2019-02-04 DIAGNOSIS — R7989 Other specified abnormal findings of blood chemistry: Secondary | ICD-10-CM

## 2019-02-04 MED ORDER — FUROSEMIDE 40 MG PO TABS: 20.0000 mg | ORAL_TABLET | Freq: Every day | ORAL | 0 refills | Status: DC

## 2019-02-28 DIAGNOSIS — L299 Pruritus, unspecified: Secondary | ICD-10-CM | POA: Diagnosis not present

## 2019-02-28 DIAGNOSIS — D0462 Carcinoma in situ of skin of left upper limb, including shoulder: Secondary | ICD-10-CM | POA: Diagnosis not present

## 2019-02-28 DIAGNOSIS — L821 Other seborrheic keratosis: Secondary | ICD-10-CM | POA: Diagnosis not present

## 2019-03-11 ENCOUNTER — Telehealth: Payer: Self-pay | Admitting: *Deleted

## 2019-03-11 ENCOUNTER — Telehealth: Payer: Self-pay | Admitting: Cardiology

## 2019-03-11 NOTE — Telephone Encounter (Signed)

## 2019-03-11 NOTE — Telephone Encounter (Signed)
Patient called about his procedure that was supposed to be done prior to the pandemic.  He would like to speak to the nurse.

## 2019-03-11 NOTE — Telephone Encounter (Signed)
Scheduled pt for telemedicine visit w/ [facility] Monday 6/29, to discuss re-scheduling ablation. Pt will need several weeks prior to procedure for platelet infusions d/t low PLTs secondary to Leukemia. Patient verbalized understanding and agreeable to plan.

## 2019-03-15 DIAGNOSIS — I2510 Atherosclerotic heart disease of native coronary artery without angina pectoris: Secondary | ICD-10-CM | POA: Diagnosis not present

## 2019-03-15 DIAGNOSIS — E038 Other specified hypothyroidism: Secondary | ICD-10-CM | POA: Diagnosis not present

## 2019-03-15 DIAGNOSIS — I495 Sick sinus syndrome: Secondary | ICD-10-CM | POA: Diagnosis not present

## 2019-03-15 DIAGNOSIS — E782 Mixed hyperlipidemia: Secondary | ICD-10-CM | POA: Diagnosis not present

## 2019-03-15 DIAGNOSIS — M545 Low back pain: Secondary | ICD-10-CM | POA: Diagnosis not present

## 2019-03-18 ENCOUNTER — Telehealth (INDEPENDENT_AMBULATORY_CARE_PROVIDER_SITE_OTHER): Payer: Medicare Other | Admitting: Cardiology

## 2019-03-18 DIAGNOSIS — I493 Ventricular premature depolarization: Secondary | ICD-10-CM | POA: Diagnosis not present

## 2019-03-18 DIAGNOSIS — E782 Mixed hyperlipidemia: Secondary | ICD-10-CM | POA: Diagnosis not present

## 2019-03-18 DIAGNOSIS — E038 Other specified hypothyroidism: Secondary | ICD-10-CM | POA: Diagnosis not present

## 2019-03-18 NOTE — Progress Notes (Signed)
Electrophysiology TeleHealth Note   Due to national recommendations of social distancing due to COVID 19, an audio/video telehealth visit is felt to be most appropriate for this patient at this time.  See Epic message for the patient's consent to telehealth for [facility].   Date:  03/18/2019   ID:  Jesse [name], DOB [birth year], MRN [id number]  Location: patient's home  Provider location: [address] [place]  Evaluation Performed: Follow-up visit  PCP:  Rochel Brome, MD  Cardiologist:  [facility] Electrophysiologist:  Dr Curt Bears  Chief Complaint: PVCs  History of Present Illness:    Jesse [name] is a 78 y.o. male who presents via audio/video conferencing for a telehealth visit today.  Since last being seen in our clinic, the patient reports doing very well.  Today, he denies symptoms of palpitations, chest pain, shortness of breath,  lower extremity edema, dizziness, presyncope, or syncope.  The patient is otherwise without complaint today.  The patient denies symptoms of fevers, chills, cough, or new SOB worrisome for COVID 19.  He has a history of PVCs.  Cardiac monitor showed 4 to 6%.  He has been having quite a bit of fatigue and weakness which he attributes to his elevated PVC burden.  Today, denies symptoms of palpitations, chest pain, shortness of breath, orthopnea, PND, lower extremity edema, claudication, dizziness, presyncope, syncope, bleeding, or neurologic sequela. The patient is tolerating medications without difficulties.  He continues to have episodes of weakness and fatigue.  It does appear that this correlates with his bradycardia which is likely due to an elevated PVC burden.  Otherwise he is doing well without major complaint.  Past Medical History:  Diagnosis Date  . Anemia    takes Ferrous Sulfate daily  . Anxiety    takes Xanax daily  . Arthritis   . BPH (benign prostatic hyperplasia) 10/25/2017  . Bradycardia 10/25/2017  . CAD (coronary  artery disease) 10/25/2017  . Chest pain 10/25/2017  . Chronic back pain   . Chronic ITP (idiopathic thrombocytopenia) (HCC) 08/08/2013  . Chronic lymphocytic leukemia ([place]) 10/25/2017  . Constipation    takes Colace daily as needed  . Dry eyes    eye drops as needed  . Dyspnea on exertion 10/25/2017  . History of blood transfusion    no abnormal reaction noted  . History of kidney stones   . Hyperlipemia 10/25/2017  . Hyperlipidemia    takes Pravastatin daily  . Infusion reaction 4 yrs ago   platelet infusion broke out head to toe  . Insomnia    takes trazodone nightly as needed  . Leukemia ([place])   . Low back pain 12/12/2012  . Lumbar radiculopathy 01/09/2013  . Muscle spasm    takes Robaxin daily as needed  . Nerve pain    takes Gabapentin daily   . Nocturia   . Renal stones 10/25/2017  . Status post lumbar surgery 12/02/2015  . Thrombocytopenia, secondary 10/25/2017   Overview:  secondary to CLL  . Vitamin B12 deficiency    takes Vit B12 daily  . Weakness    numbness and tingling    Past Surgical History:  Procedure Laterality Date  . ANTERIOR LAT LUMBAR FUSION N/A 12/02/2015   Procedure: L3-4 ANTERIOR LATERAL LUMBAR FUSION w/lateral plate;  Surgeon: Eustace Moore, MD;  Location: [facility];  Service: Neurosurgery;  Laterality: N/A;  L3-4 ANTERIOR LATERAL LUMBAR FUSION w/lateral plate  . BACK SURGERY  2012   fusion x 2  .  CATARACT EXTRACTION    . CERVICAL FUSION  1988   x 2  . COLONOSCOPY    . FOOT SURGERY    . HERNIA REPAIR Left    inguinal  . KNEE SURGERY    . LAMINECTOMY    . plates/screws/rods removed  2015  . TOE SURGERY    . TRANSURETHRAL RESECTION OF PROSTATE      Current Outpatient Medications  Medication Sig Dispense Refill  . ALPRAZolam (XANAX) 0.5 MG tablet Take 0.5 mg by mouth 3 (three) times daily.    Marland Kitchen b complex vitamins tablet Take 1 tablet by mouth daily.     . folic acid (FOLVITE) 1 MG tablet Take 1 mg by mouth daily.     . furosemide (LASIX) 40 MG  tablet Take 0.5 tablets (20 mg total) by mouth daily. Increase to 40 mg if weight is up 3 lbs from your baseline 90 tablet 0  . gabapentin (NEURONTIN) 600 MG tablet Take 600 mg by mouth 3 (three) times daily.    . Homeopathic Products ([employer] LEG CRAMPS PAIN RELIEF PO) Take 1-2 tablets by mouth at bedtime as needed.    Marland Kitchen levothyroxine (SYNTHROID, LEVOTHROID) 88 MCG tablet Take 88 mcg by mouth daily before breakfast.     . magnesium oxide (MAG-OX) 400 MG tablet Take 400 mg by mouth at bedtime.    . methocarbamol (ROBAXIN) 750 MG tablet Take 750 mg by mouth 3 (three) times daily.     . Methotrexate, Anti-Rheumatic, (METHOTREXATE, PF, ) Inject 0.6 mLs into the skin once a week.    . mexiletine (MEXITIL) 150 MG capsule Take 1 capsule (150 mg total) by mouth 2 (two) times daily. 180 capsule 3  . oxyCODONE-acetaminophen (PERCOCET) 10-325 MG tablet Take 1 tablet by mouth every 6 (six) hours as needed for pain. 60 tablet 0  . pravastatin (PRAVACHOL) 80 MG tablet Take 80 mg by mouth daily.    . Probiotic Product (PROBIOTIC DAILY PO) Take 1 capsule by mouth daily.    . Tetrahydrozoline-PEG (EYE DROPS EXTRA OP) Apply 1-2 drops to eye as needed.    . traZODone (DESYREL) 50 MG tablet Take 50-100 mg by mouth at bedtime as needed for sleep.      No current facility-administered medications for this visit.     Allergies:   Patient has no known allergies.   Social History:  The patient  reports that he has never smoked. He has never used smokeless tobacco. He reports that he does not drink alcohol or use drugs.   Family History:  The patient's  family history includes CAD in his brother and father; Congestive Heart Failure in his brother; Kidney failure in his brother; Leukemia in his brother; Prostate cancer in his brother; Throat cancer in his mother.   ROS:  Please see the history of present illness.   All other systems are personally reviewed and negative.    Exam:    Vital Signs:  Pulse (!) 33   Wt  185 lb (83.9 kg)   BMI 27.32 kg/m   Over the phone, no acute distress, no shortness of breath.  Labs/Other Tests and Data Reviewed:    Recent Labs: 09/22/2018: ALT 21; B Natriuretic Peptide 286.0; BUN 19; Creatinine, Ser 0.99; Hemoglobin 11.9; Platelets 49; Potassium 4.6; Sodium 139   Wt Readings from Last 3 Encounters:  03/18/19 185 lb (83.9 kg)  09/22/18 185 lb (83.9 kg)  08/21/18 180 lb 6.4 oz (81.8 kg)     Other  studies personally reviewed: Additional studies/ records that were reviewed today include: ECG 09/24/2018 personally reviewed  Review of the above records today demonstrates: Sinus rhythm with ventricular bigeminy   ASSESSMENT & PLAN:    1.  PVCs: Appear to be coming from the outflow tracts.  He has symptoms of weakness, fatigue, and shortness of breath.  He is currently on mexiletine.  He did not tolerate flecainide or propafenone.  It does appear that he continues to have high burden of PVCs causing his symptoms.  It does not seem that the mexiletine is making much of a difference.  We Jesse [name] thus plan for ablation.  Risks and benefits were discussed and include bleeding, tamponade, heart block, stroke.  The patient understand these risks and is agreed to the procedure.  He does have CLL.  He has thrombocytopenia as well.  Jesse [name] refer him back to his oncologist to see if we can arrange for platelet transfusions.  I would prefer his platelets to be closer to 100.   COVID 19 screen The patient denies symptoms of COVID 19 at this time.  The importance of social distancing was discussed today.  Follow-up: 3 months  Current medicines are reviewed at length with the patient today.   The patient does not have concerns regarding his medicines.  The following changes were made today:  none  Labs/ tests ordered today include:  No orders of the defined types were placed in this encounter.    Patient Risk:  after full review of this patients clinical status, I feel that they are at  moderate risk at this time.  Today, I have spent 10 minutes with the patient with telehealth technology discussing PVCs.    Signed, Jesse [name] Meredith Leeds, MD  03/18/2019 10:26 AM     [facility] [address] [place] [address] [phone] (office) [phone] (fax)

## 2019-03-20 ENCOUNTER — Telehealth: Payer: Self-pay | Admitting: Cardiology

## 2019-03-20 DIAGNOSIS — Z01812 Encounter for preprocedural laboratory examination: Secondary | ICD-10-CM

## 2019-03-20 DIAGNOSIS — I493 Ventricular premature depolarization: Secondary | ICD-10-CM

## 2019-03-20 NOTE — Telephone Encounter (Signed)
Follow up   Patient's wife would like to know when an ablation can be scheduled. Please call to discuss.

## 2019-03-21 NOTE — Telephone Encounter (Signed)
Called pt and agreed to schedule PVC ablation for 8/5, currently soonest available.  Pt made aware I would place him on wait list if sooner spot opens.  Called and informed pt PCP, [facility], also. Pt aware I will follow up in next week/two to go over instructions.

## 2019-03-21 NOTE — Telephone Encounter (Signed)
Follow message   Per Previous Message [facility]) is calling to follow up to see when ablation may be setup for the patient. Please call.

## 2019-03-28 DIAGNOSIS — C9110 Chronic lymphocytic leukemia of B-cell type not having achieved remission: Secondary | ICD-10-CM | POA: Diagnosis not present

## 2019-03-28 DIAGNOSIS — D693 Immune thrombocytopenic purpura: Secondary | ICD-10-CM | POA: Diagnosis not present

## 2019-04-04 DIAGNOSIS — C9110 Chronic lymphocytic leukemia of B-cell type not having achieved remission: Secondary | ICD-10-CM | POA: Diagnosis not present

## 2019-04-04 DIAGNOSIS — D693 Immune thrombocytopenic purpura: Secondary | ICD-10-CM | POA: Diagnosis not present

## 2019-04-04 NOTE — Telephone Encounter (Signed)
lmtcb Informed that I am out of the office next week, and if we aren't able to speak by tomorrow (made aware it is a DOD day and may be difficult to return call tomorrow), then I would call last week of this month when I return to the office.  Informed that we can go over procedure instructions then.

## 2019-04-04 NOTE — Telephone Encounter (Signed)
Wife & pt return my call. Plt today were 73.  He will have infusion again next week and 7/30.  Pt will stop by [place] office on 7/30 to have BMET & CBC drawn. Procedure instructions reviewed with pt's wife (ok per pt).  NPO after MN night before procedure  Arrive to [facility] at 8:30 am on 8/5  Hold all medications morning of procedure, ok to take needed   pain medications Post ablation follow up scheduled for 9/14 in [place]. Wife verbalized understanding and agreeable to plan.

## 2019-04-11 DIAGNOSIS — D693 Immune thrombocytopenic purpura: Secondary | ICD-10-CM | POA: Diagnosis not present

## 2019-04-11 DIAGNOSIS — C9110 Chronic lymphocytic leukemia of B-cell type not having achieved remission: Secondary | ICD-10-CM | POA: Diagnosis not present

## 2019-04-16 ENCOUNTER — Telehealth: Payer: Self-pay | Admitting: Cardiology

## 2019-04-16 NOTE — Telephone Encounter (Signed)
Spoke to pt and wife.  Office fax number given to wife.  Advised results need to be here by end of week. Advised that COVID screening Monday is no longer at [facility] but now at [facility]  Wife verbalized understanding and aware to go to new location for testing Monday. Pt and wife agreeable to plan.

## 2019-04-16 NOTE — Telephone Encounter (Signed)
New message   Patient's wife wants to know if blood work can be faxed from [facility] to Dr. Curt Bears office. Please call to discuss.

## 2019-04-18 DIAGNOSIS — D693 Immune thrombocytopenic purpura: Secondary | ICD-10-CM | POA: Diagnosis not present

## 2019-04-18 DIAGNOSIS — C9110 Chronic lymphocytic leukemia of B-cell type not having achieved remission: Secondary | ICD-10-CM | POA: Diagnosis not present

## 2019-04-22 ENCOUNTER — Other Ambulatory Visit (HOSPITAL_COMMUNITY)
Admission: RE | Admit: 2019-04-22 | Discharge: 2019-04-22 | Disposition: A | Discharge status: Home / Self Care | Payer: Medicare Other | Source: Ambulatory Visit | Attending: Cardiology | Admitting: Cardiology

## 2019-04-22 DIAGNOSIS — Z20828 Contact with and (suspected) exposure to other viral communicable diseases: Secondary | ICD-10-CM | POA: Insufficient documentation

## 2019-04-22 DIAGNOSIS — Z01812 Encounter for preprocedural laboratory examination: Secondary | ICD-10-CM | POA: Diagnosis not present

## 2019-04-22 LAB — SARS CORONAVIRUS 2 (TAT 6-24 HRS): SARS Coronavirus 2: NEGATIVE

## 2019-04-24 ENCOUNTER — Encounter (HOSPITAL_COMMUNITY): Payer: Self-pay | Admitting: Cardiology

## 2019-04-24 ENCOUNTER — Ambulatory Visit (HOSPITAL_COMMUNITY)
Admission: RE | Admit: 2019-04-24 | Discharge: 2019-04-24 | Disposition: A | Discharge status: Home / Self Care | Payer: Medicare Other | Attending: Cardiology | Admitting: Cardiology

## 2019-04-24 ENCOUNTER — Ambulatory Visit (HOSPITAL_COMMUNITY): Payer: Medicare Other | Admitting: Anesthesiology

## 2019-04-24 ENCOUNTER — Encounter (HOSPITAL_COMMUNITY)
Admission: RE | Disposition: A | Discharge status: Home / Self Care | Payer: Self-pay | Source: Home / Self Care | Attending: Cardiology

## 2019-04-24 ENCOUNTER — Other Ambulatory Visit: Payer: Self-pay

## 2019-04-24 DIAGNOSIS — Z808 Family history of malignant neoplasm of other organs or systems: Secondary | ICD-10-CM | POA: Insufficient documentation

## 2019-04-24 DIAGNOSIS — I5032 Chronic diastolic (congestive) heart failure: Secondary | ICD-10-CM | POA: Diagnosis not present

## 2019-04-24 DIAGNOSIS — Z806 Family history of leukemia: Secondary | ICD-10-CM | POA: Diagnosis not present

## 2019-04-24 DIAGNOSIS — D693 Immune thrombocytopenic purpura: Secondary | ICD-10-CM | POA: Insufficient documentation

## 2019-04-24 DIAGNOSIS — Z8042 Family history of malignant neoplasm of prostate: Secondary | ICD-10-CM | POA: Insufficient documentation

## 2019-04-24 DIAGNOSIS — F419 Anxiety disorder, unspecified: Secondary | ICD-10-CM | POA: Diagnosis not present

## 2019-04-24 DIAGNOSIS — D649 Anemia, unspecified: Secondary | ICD-10-CM | POA: Diagnosis not present

## 2019-04-24 DIAGNOSIS — I493 Ventricular premature depolarization: Secondary | ICD-10-CM | POA: Diagnosis not present

## 2019-04-24 DIAGNOSIS — I2510 Atherosclerotic heart disease of native coronary artery without angina pectoris: Secondary | ICD-10-CM | POA: Insufficient documentation

## 2019-04-24 DIAGNOSIS — M1990 Unspecified osteoarthritis, unspecified site: Secondary | ICD-10-CM | POA: Insufficient documentation

## 2019-04-24 DIAGNOSIS — N401 Enlarged prostate with lower urinary tract symptoms: Secondary | ICD-10-CM | POA: Insufficient documentation

## 2019-04-24 DIAGNOSIS — E785 Hyperlipidemia, unspecified: Secondary | ICD-10-CM | POA: Insufficient documentation

## 2019-04-24 DIAGNOSIS — Z8249 Family history of ischemic heart disease and other diseases of the circulatory system: Secondary | ICD-10-CM | POA: Insufficient documentation

## 2019-04-24 DIAGNOSIS — C9110 Chronic lymphocytic leukemia of B-cell type not having achieved remission: Secondary | ICD-10-CM | POA: Insufficient documentation

## 2019-04-24 DIAGNOSIS — G4700 Insomnia, unspecified: Secondary | ICD-10-CM | POA: Insufficient documentation

## 2019-04-24 HISTORY — PX: PVC ABLATION: EP1236

## 2019-04-24 LAB — CBC WITH DIFFERENTIAL/PLATELET
Abs Immature Granulocytes: 0.04 10*3/uL (ref 0.00–0.07)
Basophils Absolute: 0 10*3/uL (ref 0.0–0.1)
Basophils Relative: 1 %
Eosinophils Absolute: 0.2 10*3/uL (ref 0.0–0.5)
Eosinophils Relative: 5 %
HCT: 36.9 % — ABNORMAL LOW (ref 39.0–52.0)
Hemoglobin: 12.2 g/dL — ABNORMAL LOW (ref 13.0–17.0)
Immature Granulocytes: 1 %
Lymphocytes Relative: 17 %
Lymphs Abs: 0.6 10*3/uL — ABNORMAL LOW (ref 0.7–4.0)
MCH: 30.5 pg (ref 26.0–34.0)
MCHC: 33.1 g/dL (ref 30.0–36.0)
MCV: 92.3 fL (ref 80.0–100.0)
Monocytes Absolute: 0.4 10*3/uL (ref 0.1–1.0)
Monocytes Relative: 10 %
Neutro Abs: 2.4 10*3/uL (ref 1.7–7.7)
Neutrophils Relative %: 66 %
Platelets: 113 10*3/uL — ABNORMAL LOW (ref 150–400)
RBC: 4 MIL/uL — ABNORMAL LOW (ref 4.22–5.81)
RDW: 14.4 % (ref 11.5–15.5)
WBC: 3.6 10*3/uL — ABNORMAL LOW (ref 4.0–10.5)
nRBC: 0 % (ref 0.0–0.2)

## 2019-04-24 SURGERY — PVC ABLATION
Anesthesia: Monitor Anesthesia Care

## 2019-04-24 MED ORDER — ISOPROTERENOL HCL 0.2 MG/ML IJ SOLN
INTRAMUSCULAR | Status: AC
  Filled 2019-04-24: qty 5

## 2019-04-24 MED ORDER — HEPARIN (PORCINE) IN NACL 1000-0.9 UT/500ML-% IV SOLN
INTRAVENOUS | Status: AC
  Filled 2019-04-24: qty 500

## 2019-04-24 MED ORDER — PROPOFOL 500 MG/50ML IV EMUL
INTRAVENOUS | Status: DC | PRN
  Administered 2019-04-24: 50 ug/kg/min via INTRAVENOUS

## 2019-04-24 MED ORDER — ISOPROTERENOL HCL 0.2 MG/ML IJ SOLN
INTRAVENOUS | Status: DC | PRN
  Administered 2019-04-24: 2 ug/min via INTRAVENOUS

## 2019-04-24 MED ORDER — ACETAMINOPHEN 500 MG PO TABS
1000.0000 mg | ORAL_TABLET | Freq: Once | ORAL | Status: AC
  Administered 2019-04-24: 1000 mg via ORAL
  Filled 2019-04-24 (×2): qty 2

## 2019-04-24 MED ORDER — MEXILETINE HCL 150 MG PO CAPS: 150.0000 mg | ORAL_CAPSULE | Freq: Two times a day (BID) | ORAL | 3 refills | Status: DC

## 2019-04-24 MED ORDER — BUPIVACAINE HCL (PF) 0.25 % IJ SOLN
INTRAMUSCULAR | Status: AC
  Filled 2019-04-24: qty 30

## 2019-04-24 MED ORDER — SODIUM CHLORIDE 0.9 % IV SOLN
INTRAVENOUS | Status: DC
  Administered 2019-04-24: 09:00:00 via INTRAVENOUS

## 2019-04-24 SURGICAL SUPPLY — 3 items
BLANKET WARM UNDERBOD FULL ACC (MISCELLANEOUS) ×1 IMPLANT
PAD PRO RADIOLUCENT 2001M-C (PAD) ×2 IMPLANT
PATCH CARTO3 (PAD) ×1 IMPLANT

## 2019-04-24 NOTE — Discharge Instructions (Signed)
Moderate Conscious Sedation, Adult, Care After °These instructions provide you with information about caring for yourself after your procedure. Your health care provider may also give you more specific instructions. Your treatment has been planned according to current medical practices, but problems sometimes occur. Call your health care provider if you have any problems or questions after your procedure. °What can I expect after the procedure? °After your procedure, it is common: °· To feel sleepy for several hours. °· To feel clumsy and have poor balance for several hours. °· To have poor judgment for several hours. °· To vomit if you eat too soon. °Follow these instructions at home: °For at least 24 hours after the procedure: ° °· Do not: °? Participate in activities where you could fall or become injured. °? Drive. °? Use heavy machinery. °? Drink alcohol. °? Take sleeping pills or medicines that cause drowsiness. °? Make important decisions or sign legal documents. °? Take care of children on your own. °· Rest. °Eating and drinking °· Follow the diet recommended by your health care provider. °· If you vomit: °? Drink water, juice, or soup when you can drink without vomiting. °? Make sure you have little or no nausea before eating solid foods. °General instructions °· Have a responsible adult stay with you until you are awake and alert. °· Take over-the-counter and prescription medicines only as told by your health care provider. °· If you smoke, do not smoke without supervision. °· Keep all follow-up visits as told by your health care provider. This is important. °Contact a health care provider if: °· You keep feeling nauseous or you keep vomiting. °· You feel light-headed. °· You develop a rash. °· You have a fever. °Get help right away if: °· You have trouble breathing. °This information is not intended to replace advice given to you by your health care provider. Make sure you discuss any questions you have  with your health care provider. °Document Released: 06/26/2013 Document Revised: 08/18/2017 Document Reviewed: 12/26/2015 °Elsevier Patient Education © 2020 [employer] ° °

## 2019-04-24 NOTE — H&P (Signed)
Electrophysiology TeleHealth Note   Due to national recommendations of social distancing due to COVID 19, an audio/video telehealth visit is felt to be most appropriate for this patient at this time.  See Epic message for the patient's consent to telehealth for [facility].   Date:  04/24/2019   ID:  Jesse [name], DOB [birth year], MRN [id number]  Location: patient's home  Provider location: [address], [place]  Evaluation Performed: Follow-up visit  PCP:  Rochel Brome, MD  Cardiologist:  [facility] Electrophysiologist:  Dr Curt Bears  Chief Complaint: PVCs  History of Present Illness:    Jesse [name] is a 78 y.o. male who presents via audio/video conferencing for a telehealth visit today.  Since last being seen in our clinic, the patient reports doing very well.  Today, he denies symptoms of palpitations, chest pain, shortness of breath,  lower extremity edema, dizziness, presyncope, or syncope.  The patient is otherwise without complaint today.  The patient denies symptoms of fevers, chills, cough, or new SOB worrisome for COVID 19.  He has a history of PVCs.  Cardiac monitor showed 4 to 6%.  He has been having quite a bit of fatigue and weakness which he attributes to his elevated PVC burden.  Today, denies symptoms of palpitations, chest pain, shortness of breath, orthopnea, PND, lower extremity edema, claudication, dizziness, presyncope, syncope, bleeding, or neurologic sequela. The patient is tolerating medications without difficulties.  He continues to have episodes of weakness and fatigue.  It does appear that this correlates with his bradycardia which is likely due to an elevated PVC burden.  Otherwise he is doing well without major complaint.  Past Medical History:  Diagnosis Date  . Anemia    takes Ferrous Sulfate daily  . Anxiety    takes Xanax daily  . Arthritis   . BPH (benign prostatic hyperplasia) 10/25/2017  . Bradycardia 10/25/2017  . CAD (coronary  artery disease) 10/25/2017  . Chest pain 10/25/2017  . Chronic back pain   . Chronic ITP (idiopathic thrombocytopenia) (HCC) 08/08/2013  . Chronic lymphocytic leukemia ([place]) 10/25/2017  . Constipation    takes Colace daily as needed  . Dry eyes    eye drops as needed  . Dyspnea on exertion 10/25/2017  . History of blood transfusion    no abnormal reaction noted  . History of kidney stones   . Hyperlipemia 10/25/2017  . Hyperlipidemia    takes Pravastatin daily  . Infusion reaction 4 yrs ago   platelet infusion broke out head to toe  . Insomnia    takes trazodone nightly as needed  . Leukemia ([place])   . Low back pain 12/12/2012  . Lumbar radiculopathy 01/09/2013  . Muscle spasm    takes Robaxin daily as needed  . Nerve pain    takes Gabapentin daily   . Nocturia   . Renal stones 10/25/2017  . Status post lumbar surgery 12/02/2015  . Thrombocytopenia, secondary 10/25/2017   Overview:  secondary to CLL  . Vitamin B12 deficiency    takes Vit B12 daily  . Weakness    numbness and tingling    Past Surgical History:  Procedure Laterality Date  . ANTERIOR LAT LUMBAR FUSION N/A 12/02/2015   Procedure: L3-4 ANTERIOR LATERAL LUMBAR FUSION w/lateral plate;  Surgeon: Eustace Moore, MD;  Location: [place] [facility];  Service: Neurosurgery;  Laterality: N/A;  L3-4 ANTERIOR LATERAL LUMBAR FUSION w/lateral plate  . BACK SURGERY  2012   fusion x 2  .  CATARACT EXTRACTION    . CERVICAL FUSION  1988   x 2  . COLONOSCOPY    . FOOT SURGERY    . HERNIA REPAIR Left    inguinal  . KNEE SURGERY    . LAMINECTOMY    . plates/screws/rods removed  2015  . TOE SURGERY    . TRANSURETHRAL RESECTION OF PROSTATE      Current Facility-Administered Medications  Medication Dose Route Frequency Provider Last Rate Last Dose  . 0.9 %  sodium chloride infusion   Intravenous Continuous Constance Haw, MD 50 mL/hr at 04/24/19 0076      Allergies:   Patient has no known allergies.   Social History:  The patient   reports that he has never smoked. He has never used smokeless tobacco. He reports that he does not drink alcohol or use drugs.   Family History:  The patient's  family history includes CAD in his brother and father; Congestive Heart Failure in his brother; Kidney failure in his brother; Leukemia in his brother; Prostate cancer in his brother; Throat cancer in his mother.   ROS:  Please see the history of present illness.   All other systems are personally reviewed and negative.    Exam:    Vital Signs:  BP 127/66   Pulse 61   Temp 97.9 F (36.6 C) (Temporal)   Resp 20   Ht 5\' 9"  (1.753 m)   Wt 81.6 kg   SpO2 96%   BMI 26.58 kg/m   Over the phone, no acute distress, no shortness of breath.  Labs/Other Tests and Data Reviewed:    Recent Labs: 09/22/2018: ALT 21; B Natriuretic Peptide 286.0; BUN 19; Creatinine, Ser 0.99; Potassium 4.6; Sodium 139 04/24/2019: Hemoglobin 12.2; Platelets 113   Wt Readings from Last 3 Encounters:  04/24/19 81.6 kg  03/18/19 83.9 kg  09/22/18 83.9 kg     Other studies personally reviewed: Additional studies/ records that were reviewed today include: ECG 09/24/2018 personally reviewed  Review of the above records today demonstrates: Sinus rhythm with ventricular bigeminy   ASSESSMENT & PLAN:    1.  PVCs: Appear to be coming from the outflow tracts.  He has symptoms of weakness, fatigue, and shortness of breath.  He is currently on mexiletine.  He did not tolerate flecainide or propafenone.  It does appear that he continues to have high burden of PVCs causing his symptoms.  It does not seem that the mexiletine is making much of a difference.  We Neela Zecca thus plan for ablation.  Risks and benefits were discussed and include bleeding, tamponade, heart block, stroke.  The patient understand these risks and is agreed to the procedure.  He does have CLL.  He has thrombocytopenia as well.  Mekayla Soman refer him back to his oncologist to see if we can arrange for platelet  transfusions.  I would prefer his platelets to be closer to 100.  Signed, Grey Schlauch Meredith Leeds, MD  04/24/2019 11:01 AM     [facility] [address] [place] [address] [phone] (office) [phone] (fax)  Trevar Boehringer has presented today for surgery, with the diagnosis of PVCs.  The various methods of treatment have been discussed with the patient and family. After consideration of risks, benefits and other options for treatment, the patient has consented to  Procedure(s): Catheter ablation as a surgical intervention .  Risks include but not limited to bleeding, tamponade, heart block, stroke, damage to surrounding organs, among others.  The patient's history has been reviewed, patient examined, no change in status, stable for surgery.  I have reviewed the patient's chart and labs.  Questions were answered to the patient's satisfaction.    Rashauna Tep Curt Bears, MD 04/24/2019 11:01 AM

## 2019-04-24 NOTE — Anesthesia Preprocedure Evaluation (Addendum)
Anesthesia Evaluation  Patient identified by MRN, date of birth, ID band Patient awake    Reviewed: Allergy & Precautions, H&P , NPO status , Patient's Chart, lab work & pertinent test results, reviewed documented beta blocker date and time   History of Anesthesia Complications Negative for: history of anesthetic complications  Airway Mallampati: II  TM Distance: >3 FB Neck ROM: full    Dental no notable dental hx. (+) Dental Advisory Given   Pulmonary neg pulmonary ROS,    Pulmonary exam normal breath sounds clear to auscultation       Cardiovascular + CAD   Rhythm:regular Rate:Normal  Study Conclusions  - Left ventricle: The cavity size was normal. Wall thickness was   increased in a pattern of mild LVH. Systolic function was normal.   The estimated ejection fraction was in the range of 55% to 60%.   Wall motion was normal; there were no regional wall motion   abnormalities. Features are consistent with a pseudonormal left   ventricular filling pattern, with concomitant abnormal relaxation   and increased filling pressure (grade 2 diastolic dysfunction).   Doppler parameters are consistent with high ventricular filling   pressure. - Aortic valve: There was mild stenosis. Valve area (VTI): 2.52   cm^2. Valve area (Vmax): 2.38 cm^2. Valve area (Vmean): 2.2 cm^2. - Mitral valve: There was mild regurgitation. - Left atrium: The atrium was mildly dilated. - Right atrium: The atrium was mildly dilated.   Neuro/Psych PSYCHIATRIC DISORDERS Anxiety negative neurological ROS     GI/Hepatic negative GI ROS, Neg liver ROS,   Endo/Other  negative endocrine ROS  Renal/GU negative Renal ROS     Musculoskeletal   Abdominal   Peds  Hematology   Anesthesia Other Findings   Reproductive/Obstetrics                            Anesthesia Physical  Anesthesia Plan  ASA: II  Anesthesia Plan: MAC    Post-op Pain Management:    Induction:   PONV Risk Score and Plan: 1 and Ondansetron and Propofol infusion  Airway Management Planned: Natural Airway  Additional Equipment:   Intra-op Plan:   Post-operative Plan: Extubation in OR  Informed Consent: I have reviewed the patients History and Physical, chart, labs and discussed the procedure including the risks, benefits and alternatives for the proposed anesthesia with the patient or authorized representative who has indicated his/her understanding and acceptance.     Dental advisory given  Plan Discussed with: CRNA, Anesthesiologist and Surgeon  Anesthesia Plan Comments: (  )       Anesthesia Quick Evaluation

## 2019-04-24 NOTE — Anesthesia Postprocedure Evaluation (Signed)
Anesthesia Post Note  Patient: Jesse [name]  Procedure(s) Performed: PVC ABLATION (N/A )     Patient location during evaluation: PACU Anesthesia Type: MAC Level of consciousness: awake and alert Pain management: pain level controlled Vital Signs Assessment: post-procedure vital signs reviewed and stable Respiratory status: spontaneous breathing and respiratory function stable Cardiovascular status: stable Postop Assessment: no apparent nausea or vomiting Anesthetic complications: no    Last Vitals:  Vitals:   04/24/19 1313 04/24/19 1322  BP: 122/60 (!) 136/58  Pulse: 67 71  Resp: 11 15  Temp: (!) 36.3 C   SpO2: 93% 96%    Last Pain:  Vitals:   04/24/19 1322  TempSrc:   PainSc: 0-No pain                 A[place] Lebeau DANIEL

## 2019-04-24 NOTE — H&P (Signed)
Jesse [name] has presented today for surgery, with the diagnosis of PVCs.  The various methods of treatment have been discussed with the patient and family. After consideration of risks, benefits and other options for treatment, the patient has consented to  Procedure(s): Catheter ablation as a surgical intervention .  Risks include but not limited to bleeding, tamponade, heart block, stroke, damage to surrounding organs, among others. The patient's history has been reviewed, patient examined, no change in status, stable for surgery.  I have reviewed the patient's chart and labs.  Questions were answered to the patient's satisfaction.    Derick Seminara Curt Bears, MD 04/24/2019 8:40 AM

## 2019-04-24 NOTE — Transfer of Care (Signed)
Immediate Anesthesia Transfer of Care Note  Patient: Jesse [name]  Procedure(s) Performed: PVC ABLATION (N/A )  Patient Location: PACU  Anesthesia Type:MAC  Level of Consciousness: awake  Airway & Oxygen Therapy: Patient Spontanous Breathing  Post-op Assessment: Report given to RN and Post -op Vital signs reviewed and stable  Post vital signs: Reviewed and stable  Last Vitals:  Vitals Value Taken Time  BP 136/58 04/24/19 1322  Temp 36.3 C 04/24/19 1313  Pulse 71 04/24/19 1324  Resp 12 04/24/19 1324  SpO2 96 % 04/24/19 1324  Vitals shown include unvalidated device data.  Last Pain:  Vitals:   04/24/19 1322  TempSrc:   PainSc: 0-No pain         Complications: No apparent anesthesia complications

## 2019-05-28 ENCOUNTER — Telehealth: Payer: Self-pay | Admitting: Cardiology

## 2019-05-28 NOTE — Telephone Encounter (Signed)
Patient called saying the pharmacy wouldn't refill his mexiletine (MEXITIL) 150 MG capsule. They wouldn't tell him why.  I see he still has 3 refills left on it, when it was last filled on 04/24/2019, sent to [facility].

## 2019-05-28 NOTE — Telephone Encounter (Signed)
[facility] is stating that mexiletine 150 mg capsule is no longer in production and is not available and would Dr. Curt Bears like to change therapy? Please address

## 2019-05-29 NOTE — Telephone Encounter (Signed)
Wife called to report that they found a 30 day supply of Mexiletine. Thanked her for the update. Explained that I was looking more into this and would let them know if I can find other places where it may be "in stock". She also reports pt having low HRs at different times.  States they are getting numbers from pulse ox or manually. States "he checks his pulse manually and it is weak" Aware that we will discuss both issues/concerns further when we see them Monday, per [place]. Wife agreeable to plan.

## 2019-05-30 ENCOUNTER — Other Ambulatory Visit: Payer: Self-pay | Admitting: Cardiology

## 2019-05-30 DIAGNOSIS — R7989 Other specified abnormal findings of blood chemistry: Secondary | ICD-10-CM

## 2019-06-03 ENCOUNTER — Other Ambulatory Visit: Payer: Self-pay | Admitting: Cardiology

## 2019-06-03 ENCOUNTER — Ambulatory Visit (INDEPENDENT_AMBULATORY_CARE_PROVIDER_SITE_OTHER): Payer: Medicare Other | Admitting: Cardiology

## 2019-06-03 ENCOUNTER — Encounter: Payer: Self-pay | Admitting: Cardiology

## 2019-06-03 ENCOUNTER — Other Ambulatory Visit: Payer: Self-pay

## 2019-06-03 VITALS — BP 118/62 | HR 61 | Ht 69.0 in | Wt 181.8 lb

## 2019-06-03 DIAGNOSIS — I493 Ventricular premature depolarization: Secondary | ICD-10-CM | POA: Diagnosis not present

## 2019-06-03 MED ORDER — AMIODARONE HCL 200 MG PO TABS: ORAL_TABLET | ORAL | 0 refills | Status: DC

## 2019-06-03 MED ORDER — AMIODARONE HCL 200 MG PO TABS: 200.0000 mg | ORAL_TABLET | Freq: Every day | ORAL | 2 refills | Status: DC

## 2019-06-03 NOTE — Progress Notes (Signed)
Electrophysiology Office Note   Date:  06/03/2019   ID:  Jesse [name], DOB [birth year], MRN [id number]  PCP:  Rochel Brome, MD  Cardiologist:  Bettina Gavia Primary Electrophysiologist:  Constance Haw, MD    No chief complaint on file.    History of Present Illness: Jesse [name] is a 78 y.o. male who is being seen today for the evaluation of PVCs at the request of Shirlee More. Presenting today for electrophysiology evaluation.  Has a history significant for coronary artery disease, ITP, CLL, hypertension, hyperlipidemia.  He wore recent Holter monitor that showed 6.1% PVCs with episodes of bigeminy and trigeminy.  He has had some bradycardia associated with his PVCs.  Main symptoms associated with PVCs are weakness, fatigue, and shortness of breath.  He has no chest pain.  He does note that his heart rate is in the 30s at times.  Recent attempt at ablation, but unfortunately had no PVCs on arrival to the hospital.  Today, denies symptoms of palpitations, chest pain, shortness of breath, orthopnea, PND, lower extremity edema, claudication, dizziness, presyncope, syncope, bleeding, or neurologic sequela. The patient is tolerating medications without difficulties.  He continues to have episodes of fatigue and weakness.  He checks his heart rate via pulse ox and at times heart rates are in the 30s.  These do correlate with his symptoms.  He feels well when his heart rates are in the 60s.   Past Medical History:  Diagnosis Date  . Anemia    takes Ferrous Sulfate daily  . Anxiety    takes Xanax daily  . Arthritis   . BPH (benign prostatic hyperplasia) 10/25/2017  . Bradycardia 10/25/2017  . CAD (coronary artery disease) 10/25/2017  . Chest pain 10/25/2017  . Chronic back pain   . Chronic ITP (idiopathic thrombocytopenia) (HCC) 08/08/2013  . Chronic lymphocytic leukemia ([place]) 10/25/2017  . Constipation    takes Colace daily as needed  . Dry eyes    eye drops as needed  . Dyspnea on exertion  10/25/2017  . History of blood transfusion    no abnormal reaction noted  . History of kidney stones   . Hyperlipemia 10/25/2017  . Hyperlipidemia    takes Pravastatin daily  . Infusion reaction 4 yrs ago   platelet infusion broke out head to toe  . Insomnia    takes trazodone nightly as needed  . Leukemia ([place])   . Low back pain 12/12/2012  . Lumbar radiculopathy 01/09/2013  . Muscle spasm    takes Robaxin daily as needed  . Nerve pain    takes Gabapentin daily   . Nocturia   . Renal stones 10/25/2017  . Status post lumbar surgery 12/02/2015  . Thrombocytopenia, secondary 10/25/2017   Overview:  secondary to CLL  . Vitamin B12 deficiency    takes Vit B12 daily  . Weakness    numbness and tingling   Past Surgical History:  Procedure Laterality Date  . ANTERIOR LAT LUMBAR FUSION N/A 12/02/2015   Procedure: L3-4 ANTERIOR LATERAL LUMBAR FUSION w/lateral plate;  Surgeon: Eustace Moore, MD;  Location: [place] [facility];  Service: Neurosurgery;  Laterality: N/A;  L3-4 ANTERIOR LATERAL LUMBAR FUSION w/lateral plate  . BACK SURGERY  2012   fusion x 2  . CATARACT EXTRACTION    . CERVICAL FUSION  1988   x 2  . COLONOSCOPY    . FOOT SURGERY    . HERNIA REPAIR Left    inguinal  . KNEE SURGERY    .  LAMINECTOMY    . plates/screws/rods removed  2015  . PVC ABLATION N/A 04/24/2019   Procedure: PVC ABLATION;  Surgeon: Constance Haw, MD;  Location: [place] [facility];  Service: Cardiovascular;  Laterality: N/A;  . TOE SURGERY    . TRANSURETHRAL RESECTION OF PROSTATE       Current Outpatient Medications  Medication Sig Dispense Refill  . ALPRAZolam (XANAX) 0.5 MG tablet Take 0.5 mg by mouth 2 (two) times daily.     . B Complex-C (B-COMPLEX WITH VITAMIN C) tablet Take 1 tablet by mouth daily.    . folic acid (FOLVITE) 1 MG tablet Take 1 mg by mouth daily.     . furosemide (LASIX) 40 MG tablet Take 0.5 tablets (20 mg total) by mouth daily. Increase to whole tab if weight 3 lbs greater than  baseline NEED OFFICE VISIT FOR MORE REFILLS 45 tablet 0  . gabapentin (NEURONTIN) 600 MG tablet Take 600 mg by mouth 3 (three) times daily.    Marland Kitchen HOMEOPATHIC PRODUCTS PO Take 10 drops by mouth daily. Liquid No! Muscle Cramps    . levothyroxine (SYNTHROID) 100 MCG tablet Take 100 mcg by mouth daily before breakfast.    . magnesium oxide (MAG-OX) 400 MG tablet Take 400 mg by mouth daily as needed (leg cramps).     . methocarbamol (ROBAXIN) 750 MG tablet Take 750 mg by mouth 4 (four) times daily.     . Methotrexate, Anti-Rheumatic, (METHOTREXATE, PF, [place]) Inject 7.5 mg into the skin every Sunday. 0.3 ML (25 MG/ML)    . oxyCODONE-acetaminophen (PERCOCET) 10-325 MG tablet Take 1 tablet by mouth every 6 (six) hours as needed for pain. 60 tablet 0  . pravastatin (PRAVACHOL) 80 MG tablet Take 80 mg by mouth every evening.     . Probiotic Product (PROBIOTIC DAILY PO) Take 1 capsule by mouth daily.    . Tetrahydrozoline-PEG (EYE DROPS EXTRA OP) Apply 1-2 drops to eye as needed.    . traZODone (DESYREL) 50 MG tablet Take 50-100 mg by mouth at bedtime as needed for sleep.     Marland Kitchen amiodarone (PACERONE) 200 MG tablet Take 2 tablets (400 mg total) TWICE a day for 2 weeks, then take 1 tablet (200 mg total) TWICE a day for 2 weeks, then take 1 tablet ONCE daily 84 tablet 0   No current facility-administered medications for this visit.     Allergies:   Patient has no known allergies.   Social History:  The patient  reports that he has never smoked. He has never used smokeless tobacco. He reports that he does not drink alcohol or use drugs.   Family History:  The patient's family history includes CAD in his brother and father; Congestive Heart Failure in his brother; Kidney failure in his brother; Leukemia in his brother; Prostate cancer in his brother; Throat cancer in his mother.   ROS:  Please see the history of present illness.   Otherwise, review of systems is positive for none.   All other systems are reviewed  and negative.   PHYSICAL EXAM: VS:  BP 118/62   Pulse 61   Ht 5\' 9"  (1.753 m)   Wt 181 lb 12.8 oz (82.5 kg)   SpO2 96%   BMI 26.85 kg/m  , BMI Body mass index is 26.85 kg/m. GEN: Well nourished, well developed, in no acute distress  HEENT: normal  Neck: no JVD, carotid bruits, or masses Cardiac: iRRR; no murmurs, rubs, or gallops,no edema  Respiratory:  clear to auscultation bilaterally, normal work of breathing GI: soft, nontender, nondistended, + BS MS: no deformity or atrophy  Skin: warm and dry Neuro:  Strength and sensation are intact Psych: euthymic mood, full affect  EKG:  EKG is ordered today. Personal review of the ekg ordered shows sinus rhythm with ventricular bigeminy  Recent Labs: 09/22/2018: ALT 21; B Natriuretic Peptide 286.0; BUN 19; Creatinine, Ser 0.99; Potassium 4.6; Sodium 139 04/24/2019: Hemoglobin 12.2; Platelets 113    Lipid Panel  No results found for: CHOL, TRIG, HDL, CHOLHDL, VLDL, LDLCALC, LDLDIRECT   Wt Readings from Last 3 Encounters:  06/03/19 181 lb 12.8 oz (82.5 kg)  04/24/19 180 lb (81.6 kg)  03/18/19 185 lb (83.9 kg)      Other studies Reviewed: Additional studies/ records that were reviewed today include: TTE 11/20/17  Review of the above records today demonstrates:  - Left ventricle: The cavity size was normal. Wall thickness was   increased in a pattern of mild LVH. Systolic function was normal.   The estimated ejection fraction was in the range of 55% to 60%.   Wall motion was normal; there were no regional wall motion   abnormalities. Features are consistent with a pseudonormal left   ventricular filling pattern, with concomitant abnormal relaxation   and increased filling pressure (grade 2 diastolic dysfunction).   Doppler parameters are consistent with high ventricular filling   pressure. - Aortic valve: There was mild stenosis. Valve area (VTI): 2.52   cm^2. Valve area (Vmax): 2.38 cm^2. Valve area (Vmean): 2.2 cm^2. - Mitral  valve: There was mild regurgitation. - Left atrium: The atrium was mildly dilated. - Right atrium: The atrium was mildly dilated.  Cardiac Monitor 06/01/18 - personally reviewed The rhythm throughout his sinus with minimum average and maximum heart rates of 47, 61 and 127 bpm.  IVCD is noted.  The minimum rate is sinus bradycardia at 10:32 AM.  Ventricular ectopy, 6.1 % with frequent PVCs 309 couplets and one triplet.  There are episodes of bigeminy and trigeminy noted.  The episode of ventricular bigeminy persisted for 5 minutes and 46 seconds and trigeminy 1 minute and 34 seconds.  Supraventricular ectopy, 4.9% with rare couplets and no episodes of atrial tachycardia atrial fibrillation or flutter.  There were no bradycardic events, no episodes of sinus node or AV block or pauses of 3 seconds or greater.  There were no triggered or symptomatic events noted.  Conclusion, no significant bradycardia noted.  I suspect the episode of patient noted bradycardia with bigeminy and trigeminy with the pulse oximeter.  There are frequent PVCs and APCs that are asymptomatic.  ASSESSMENT AND PLAN:  1.  PVCs: Symptoms of weakness, fatigue, and shortness of breath.  He did have an attempt at ablation, but did not have PVCs at a time.  Comes in today with PVCs and continue to symptoms.  Due to that, we Rosaland Shiffman stop his mexiletine and start amiodarone.  2. 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  Alejos Reinhardt likely improve after effective PVC therapy.  3. Hypertension: Currently well controlled  4. Hyperlipidemia: Pravastatin per primary cardiology  Current medicines are reviewed at length with the patient today.   The patient does not have concerns regarding his medicines.  The following changes were made today: Stop mexiletine, start amiodarone  Labs/ tests ordered today include:  Orders Placed This Encounter  Procedures  . EKG 12-Lead     Disposition:   FU with Arlester Keehan 3  months  Signed, Ramla Hase Meredith Leeds, MD  06/03/2019 11:44 AM     [facility] [address] [place] [address] [phone] (office) [phone] (fax)

## 2019-06-03 NOTE — Patient Instructions (Addendum)
Medication Instructions:  Your physician has recommended you make the following change in your medication:  1. STOP Mexiletine 2. START Amiodarone:  - take 2 tablets (400 mg total) TWICE a day for 2 weeks,  - take 1 tablet (200 mg total) TWICE a day for 2 weeks,  - take 1 tablet (200 mg total) ONCE daily  * If you need a refill on your cardiac medications before your next appointment, please call your pharmacy.   Labwork: None ordered  Testing/Procedures: None ordered  Follow-Up: Your physician recommends that you schedule a follow-up appointment in: 3 months with Dr. Curt Bears.  You are scheduled on 08/26/19 @ 11:00 am in [place] office.  Thank you for choosing [facility]!!   Trinidad Curet, RN [phone]   Any Other Special Instructions Will Be Listed Below (If Applicable).  Amiodarone tablets What is this medicine? AMIODARONE (a MEE oh da rone) is an antiarrhythmic drug. It helps make your heart beat regularly. Because of the side effects caused by this medicine, it is only used when other medicines have not worked. It is usually used for heartbeat problems that may be life threatening. This medicine may be used for other purposes; ask your health care provider or pharmacist if you have questions. COMMON BRAND NAME(S): Cordarone, Pacerone What should I tell my health care provider before I take this medicine? They need to know if you have any of these conditions:  liver disease  lung disease  other heart problems  thyroid disease  an unusual or allergic reaction to amiodarone, iodine, other medicines, foods, dyes, or preservatives  pregnant or trying to get pregnant  breast-feeding How should I use this medicine? Take this medicine by mouth with a glass of water. Follow the directions on the prescription label. You can take this medicine with or without food. However, you should always take it the same way each time. Take your doses at regular intervals. Do  not take your medicine more often than directed. Do not stop taking except on the advice of your doctor or health care professional. A special MedGuide will be given to you by the pharmacist with each prescription and refill. Be sure to read this information carefully each time. Talk to your pediatrician regarding the use of this medicine in children. Special care may be needed. Overdosage: If you think you have taken too much of this medicine contact a poison control center or emergency room at once. NOTE: This medicine is only for you. Do not share this medicine with others. What if I miss a dose? If you miss a dose, take it as soon as you can. If it is almost time for your next dose, take only that dose. Do not take double or extra doses. What may interact with this medicine? Do not take this medicine with any of the following medications:  abarelix  apomorphine  arsenic trioxide  certain antibiotics like erythromycin, gemifloxacin, levofloxacin, pentamidine  certain medicines for depression like amoxapine, tricyclic antidepressants  certain medicines for fungal infections like fluconazole, itraconazole, ketoconazole, posaconazole, voriconazole  certain medicines for irregular heart beat like disopyramide, dronedarone, ibutilide, propafenone, sotalol  certain medicines for malaria like chloroquine, halofantrine  cisapride  droperidol  haloperidol  hawthorn  maprotiline  methadone  phenothiazines like chlorpromazine, mesoridazine, thioridazine  pimozide  ranolazine  red yeast rice  vardenafil This medicine may also interact with the following medications:  antiviral medicines for HIV or AIDS  certain medicines for blood pressure, heart disease, irregular  heart beat  certain medicines for cholesterol like atorvastatin, cerivastatin, lovastatin, simvastatin  certain medicines for hepatitis C like sofosbuvir and ledipasvir; sofosbuvir  certain medicines for  seizures like phenytoin  certain medicines for thyroid problems  certain medicines that treat or prevent blood clots like warfarin  cholestyramine  cimetidine  clopidogrel  cyclosporine  dextromethorphan  diuretics  dofetilide  fentanyl  general anesthetics  grapefruit juice  lidocaine  loratadine  methotrexate  other medicines that prolong the QT interval (cause an abnormal heart rhythm)  procainamide  quinidine  rifabutin, rifampin, or rifapentine  St. John's Wort  trazodone  ziprasidone This list may not describe all possible interactions. Give your health care provider a list of all the medicines, herbs, non-prescription drugs, or dietary supplements you use. Also tell them if you smoke, drink alcohol, or use illegal drugs. Some items may interact with your medicine. What should I watch for while using this medicine? Your condition will be monitored closely when you first begin therapy. Often, this drug is first started in a hospital or other monitored health care setting. Once you are on maintenance therapy, visit your doctor or health care professional for regular checks on your progress. Because your condition and use of this medicine carry some risk, it is a good idea to carry an identification card, necklace or bracelet with details of your condition, medications, and doctor or health care professional. Dennis Bast may get drowsy or dizzy. Do not drive, use machinery, or do anything that needs mental alertness until you know how this medicine affects you. Do not stand or sit up quickly, especially if you are an older patient. This reduces the risk of dizzy or fainting spells. This medicine can make you more sensitive to the sun. Keep out of the sun. If you cannot avoid being in the sun, wear protective clothing and use sunscreen. Do not use sun lamps or tanning beds/booths. You should have regular eye exams before and during treatment. Call your doctor if you have  blurred vision, see halos, or your eyes become sensitive to light. Your eyes may get dry. It may be helpful to use a lubricating eye solution or artificial tears solution. If you are going to have surgery or a procedure that requires contrast dyes, tell your doctor or health care professional that you are taking this medicine. What side effects may I notice from receiving this medicine? Side effects that you should report to your doctor or health care professional as soon as possible:  allergic reactions like skin rash, itching or hives, swelling of the face, lips, or tongue  blue-gray coloring of the skin  blurred vision, seeing blue green halos, increased sensitivity of the eyes to light  breathing problems  chest pain  dark urine  fast, irregular heartbeat  feeling faint or light-headed  intolerance to heat or cold  nausea or vomiting  pain and swelling of the scrotum  pain, tingling, numbness in feet, hands  redness, blistering, peeling or loosening of the skin, including inside the mouth  spitting up blood  stomach pain  sweating  unusual or uncontrolled movements of body  unusually weak or tired  weight gain or loss  yellowing of the eyes or skin Side effects that usually do not require medical attention (report to your doctor or health care professional if they continue or are bothersome):  change in sex drive or performance  constipation  dizziness  headache  loss of appetite  trouble sleeping This list may not  describe all possible side effects. Call your doctor for medical advice about side effects. You may report side effects to FDA at 1-800-FDA-1088. Where should I keep my medicine? Keep out of the reach of children. Store at room temperature between 20 and 25 degrees C (68 and 77 degrees F). Protect from light. Keep container tightly closed. Throw away any unused medicine after the expiration date. NOTE: This sheet is a summary. It may not  cover all possible information. If you have questions about this medicine, talk to your doctor, pharmacist, or health care provider.  2020 Elsevier/Gold Standard (2018-08-08 13:44:04)

## 2019-06-18 NOTE — Telephone Encounter (Signed)
Pt was switched to Amiodarone on 9/14

## 2019-06-19 DIAGNOSIS — E782 Mixed hyperlipidemia: Secondary | ICD-10-CM | POA: Diagnosis not present

## 2019-06-19 DIAGNOSIS — D6949 Other primary thrombocytopenia: Secondary | ICD-10-CM | POA: Diagnosis not present

## 2019-06-19 DIAGNOSIS — E038 Other specified hypothyroidism: Secondary | ICD-10-CM | POA: Diagnosis not present

## 2019-06-19 DIAGNOSIS — I2510 Atherosclerotic heart disease of native coronary artery without angina pectoris: Secondary | ICD-10-CM | POA: Diagnosis not present

## 2019-06-25 ENCOUNTER — Telehealth: Payer: Self-pay | Admitting: Cardiology

## 2019-06-25 DIAGNOSIS — Z0000 Encounter for general adult medical examination without abnormal findings: Secondary | ICD-10-CM | POA: Diagnosis not present

## 2019-06-25 NOTE — Telephone Encounter (Signed)
Pt wife calls in reporting pt still experiencing some dizziness. Pt is currently taking Amiodarone 200 mg BID until next Tuesday when he decreases it to once daily. Aware I will forward to Dr. Curt Bears for advisement, but informed her that most likely we will decrease to once daily to see if improvement in dizziness. Wife understands I will call her once advised on.

## 2019-06-25 NOTE — Telephone Encounter (Signed)
° ° °  Pt c/o medication issue:  1. Name of Medication: amiodarone (PACERONE) 200 MG tablet  2. How are you currently taking this medication (dosage and times per day)? As written  3. Are you having a reaction (difficulty breathing--STAT)? dizziness  4. What is your medication issue? Patient states med may be causing him to feel dizziness

## 2019-06-27 NOTE — Telephone Encounter (Signed)
Advised to decrease Amiodarone to 200 mg once daily. Advised to call office next week if issues don't resolve. Wife verbalized understanding and agreeable to plan.

## 2019-07-23 DIAGNOSIS — R944 Abnormal results of kidney function studies: Secondary | ICD-10-CM | POA: Diagnosis not present

## 2019-08-13 DIAGNOSIS — L570 Actinic keratosis: Secondary | ICD-10-CM | POA: Diagnosis not present

## 2019-08-13 DIAGNOSIS — C4442 Squamous cell carcinoma of skin of scalp and neck: Secondary | ICD-10-CM | POA: Diagnosis not present

## 2019-08-13 DIAGNOSIS — L821 Other seborrheic keratosis: Secondary | ICD-10-CM | POA: Diagnosis not present

## 2019-08-26 ENCOUNTER — Encounter: Payer: Self-pay | Admitting: Cardiology

## 2019-08-26 ENCOUNTER — Ambulatory Visit (INDEPENDENT_AMBULATORY_CARE_PROVIDER_SITE_OTHER): Payer: Medicare Other | Admitting: Cardiology

## 2019-08-26 ENCOUNTER — Other Ambulatory Visit: Payer: Self-pay

## 2019-08-26 VITALS — BP 126/64 | HR 47 | Ht 69.0 in | Wt 185.2 lb

## 2019-08-26 DIAGNOSIS — I493 Ventricular premature depolarization: Secondary | ICD-10-CM | POA: Diagnosis not present

## 2019-08-26 MED ORDER — AMIODARONE HCL 100 MG PO TABS: 100.0000 mg | ORAL_TABLET | Freq: Every day | ORAL | 1 refills | Status: DC

## 2019-08-26 NOTE — Patient Instructions (Signed)
Medication Instructions:  Your physician has recommended you make the following change in your medication:  1. DECREASE Amiodarone to 100 mg once daily  * If you need a refill on your cardiac medications before your next appointment, please call your pharmacy.   Labwork: None ordered  Testing/Procedures: None ordered  Follow-Up: Your physician wants you to follow-up in: 6 months with Dr. Curt Bears.  You will receive a reminder letter in the mail two months in advance. If you don't receive a letter, please call our office to schedule the follow-up appointment.   Thank you for choosing [facility]!!   Trinidad Curet, RN [phone]

## 2019-08-26 NOTE — Progress Notes (Signed)
Electrophysiology Office Note   Date:  08/26/2019   ID:  Jesse [name], DOB 26-Nov-1940, MRN [id number]  PCP:  Rochel Brome, MD  Cardiologist:  Bettina Gavia Primary Electrophysiologist:  Constance Haw, MD    No chief complaint on file.    History of Present Illness: Jesse [name] is a 78 y.o. male who is being seen today for the evaluation of PVCs at the request of Shirlee More. Presenting today for electrophysiology evaluation.  Has a history significant for coronary artery disease, ITP, CLL, hypertension, hyperlipidemia.  He wore recent Holter monitor that showed 6.1% PVCs with episodes of bigeminy and trigeminy.  He has had some bradycardia associated with his PVCs.  Main symptoms associated with PVCs are weakness, fatigue, and shortness of breath.  He has no chest pain.  He does note that his heart rate is in the 30s at times.  Recent attempt at ablation, but unfortunately had no PVCs on arrival to the hospital.  Today, denies symptoms of palpitations, chest pain, shortness of breath, orthopnea, PND, lower extremity edema, claudication, dizziness, presyncope, syncope, bleeding, or neurologic sequela. The patient is tolerating medications without difficulties.  He feels that the PVCs have somewhat resolved on his amiodarone.  Despite that, he has been getting dizzy.  His dizziness improved with decreasing the amiodarone load to once a day.   Past Medical History:  Diagnosis Date  . Anemia    takes Ferrous Sulfate daily  . Anxiety    takes Xanax daily  . Arthritis   . BPH (benign prostatic hyperplasia) 10/25/2017  . Bradycardia 10/25/2017  . CAD (coronary artery disease) 10/25/2017  . Chest pain 10/25/2017  . Chronic back pain   . Chronic ITP (idiopathic thrombocytopenia) (HCC) 08/08/2013  . Chronic lymphocytic leukemia ([place]) 10/25/2017  . Constipation    takes Colace daily as needed  . Dry eyes    eye drops as needed  . Dyspnea on exertion 10/25/2017  . History of blood transfusion     no abnormal reaction noted  . History of kidney stones   . Hyperlipemia 10/25/2017  . Hyperlipidemia    takes Pravastatin daily  . Infusion reaction 4 yrs ago   platelet infusion broke out head to toe  . Insomnia    takes trazodone nightly as needed  . Leukemia ([place])   . Low back pain 12/12/2012  . Lumbar radiculopathy 01/09/2013  . Muscle spasm    takes Robaxin daily as needed  . Nerve pain    takes Gabapentin daily   . Nocturia   . Renal stones 10/25/2017  . Status post lumbar surgery 12/02/2015  . Thrombocytopenia, secondary 10/25/2017   Overview:  secondary to CLL  . Vitamin B12 deficiency    takes Vit B12 daily  . Weakness    numbness and tingling   Past Surgical History:  Procedure Laterality Date  . ANTERIOR LAT LUMBAR FUSION N/A 12/02/2015   Procedure: L3-4 ANTERIOR LATERAL LUMBAR FUSION w/lateral plate;  Surgeon: Eustace Moore, MD;  Location: [facility];  Service: Neurosurgery;  Laterality: N/A;  L3-4 ANTERIOR LATERAL LUMBAR FUSION w/lateral plate  . BACK SURGERY  2012   fusion x 2  . CATARACT EXTRACTION    . CERVICAL FUSION  1988   x 2  . COLONOSCOPY    . FOOT SURGERY    . HERNIA REPAIR Left    inguinal  . KNEE SURGERY    . LAMINECTOMY    . plates/screws/rods removed  2015  .  PVC ABLATION N/A 04/24/2019   Procedure: PVC ABLATION;  Surgeon: Constance Haw, MD;  Location: [facility];  Service: Cardiovascular;  Laterality: N/A;  . TOE SURGERY    . TRANSURETHRAL RESECTION OF PROSTATE       Current Outpatient Medications  Medication Sig Dispense Refill  . ALPRAZolam (XANAX) 0.5 MG tablet Take 0.5 mg by mouth 2 (two) times daily.     . B Complex-C (B-COMPLEX WITH VITAMIN C) tablet Take 1 tablet by mouth daily.    . folic acid (FOLVITE) 1 MG tablet Take 1 mg by mouth daily.     . furosemide (LASIX) 40 MG tablet Take 0.5 tablets (20 mg total) by mouth daily. Increase to whole tab if weight 3 lbs greater than baseline NEED OFFICE VISIT FOR MORE REFILLS 45  tablet 0  . gabapentin (NEURONTIN) 600 MG tablet Take 600 mg by mouth 3 (three) times daily.    Marland Kitchen HOMEOPATHIC PRODUCTS PO Take 10 drops by mouth daily. Liquid No! Muscle Cramps    . levothyroxine (SYNTHROID) 100 MCG tablet Take 100 mcg by mouth daily before breakfast.    . magnesium oxide (MAG-OX) 400 MG tablet Take 400 mg by mouth daily as needed (leg cramps).     . methocarbamol (ROBAXIN) 750 MG tablet Take 750 mg by mouth 4 (four) times daily.     . Methotrexate, Anti-Rheumatic, (METHOTREXATE, PF, [place]) Inject 7.5 mg into the skin every Sunday. 0.3 ML (25 MG/ML)    . oxyCODONE-acetaminophen (PERCOCET) 10-325 MG tablet Take 1 tablet by mouth every 6 (six) hours as needed for pain. 60 tablet 0  . pravastatin (PRAVACHOL) 80 MG tablet Take 80 mg by mouth every evening.     . Probiotic Product (PROBIOTIC DAILY PO) Take 1 capsule by mouth daily.    . Tetrahydrozoline-PEG (EYE DROPS EXTRA OP) Apply 1-2 drops to eye as needed.    . traZODone (DESYREL) 50 MG tablet Take 50-100 mg by mouth at bedtime as needed for sleep.     Marland Kitchen amiodarone (PACERONE) 100 MG tablet Take 1 tablet (100 mg total) by mouth daily. 90 tablet 1   No current facility-administered medications for this visit.     Allergies:   Patient has no known allergies.   Social History:  The patient  reports that he has never smoked. He has never used smokeless tobacco. He reports that he does not drink alcohol or use drugs.   Family History:  The patient's family history includes CAD in his brother and father; Congestive Heart Failure in his brother; Kidney failure in his brother; Leukemia in his brother; Prostate cancer in his brother; Throat cancer in his mother.   ROS:  Please see the history of present illness.   Otherwise, review of systems is positive for none.   All other systems are reviewed and negative.   PHYSICAL EXAM: VS:  BP 126/64   Pulse (!) 47   Ht 5\' 9"  (1.753 m)   Wt 185 lb 3.2 oz (84 kg)   SpO2 96%   BMI 27.35 kg/m   , BMI Body mass index is 27.35 kg/m. GEN: Well nourished, well developed, in no acute distress  HEENT: normal  Neck: no JVD, carotid bruits, or masses Cardiac: RRR; no murmurs, rubs, or gallops,no edema  Respiratory:  clear to auscultation bilaterally, normal work of breathing GI: soft, nontender, nondistended, + BS MS: no deformity or atrophy  Skin: warm and dry Neuro:  Strength and sensation are intact  Psych: euthymic mood, full affect  EKG:  EKG is ordered today. Personal review of the ekg ordered shows sinus rhythm, rate 47   Recent Labs: 09/22/2018: ALT 21; B Natriuretic Peptide 286.0; BUN 19; Creatinine, Ser 0.99; Potassium 4.6; Sodium 139 04/24/2019: Hemoglobin 12.2; Platelets 113    Lipid Panel  No results found for: CHOL, TRIG, HDL, CHOLHDL, VLDL, LDLCALC, LDLDIRECT   Wt Readings from Last 3 Encounters:  08/26/19 185 lb 3.2 oz (84 kg)  06/03/19 181 lb 12.8 oz (82.5 kg)  04/24/19 180 lb (81.6 kg)      Other studies Reviewed: Additional studies/ records that were reviewed today include: TTE 11/20/17  Review of the above records today demonstrates:  - Left ventricle: The cavity size was normal. Wall thickness was   increased in a pattern of mild LVH. Systolic function was normal.   The estimated ejection fraction was in the range of 55% to 60%.   Wall motion was normal; there were no regional wall motion   abnormalities. Features are consistent with a pseudonormal left   ventricular filling pattern, with concomitant abnormal relaxation   and increased filling pressure (grade 2 diastolic dysfunction).   Doppler parameters are consistent with high ventricular filling   pressure. - Aortic valve: There was mild stenosis. Valve area (VTI): 2.52   cm^2. Valve area (Vmax): 2.38 cm^2. Valve area (Vmean): 2.2 cm^2. - Mitral valve: There was mild regurgitation. - Left atrium: The atrium was mildly dilated. - Right atrium: The atrium was mildly dilated.  Cardiac Monitor  06/01/18 - personally reviewed The rhythm throughout his sinus with minimum average and maximum heart rates of 47, 61 and 127 bpm.  IVCD is noted.  The minimum rate is sinus bradycardia at 10:32 AM.  Ventricular ectopy, 6.1 % with frequent PVCs 309 couplets and one triplet.  There are episodes of bigeminy and trigeminy noted.  The episode of ventricular bigeminy persisted for 5 minutes and 46 seconds and trigeminy 1 minute and 34 seconds.  Supraventricular ectopy, 4.9% with rare couplets and no episodes of atrial tachycardia atrial fibrillation or flutter.  There were no bradycardic events, no episodes of sinus node or AV block or pauses of 3 seconds or greater.  There were no triggered or symptomatic events noted.  Conclusion, no significant bradycardia noted.  I suspect the episode of patient noted bradycardia with bigeminy and trigeminy with the pulse oximeter.  There are frequent PVCs and APCs that are asymptomatic.  ASSESSMENT AND PLAN:  1.  PVCs: Symptoms of weakness, fatigue, and shortness of breath.  Has recently been put on amiodarone.  He is having some dizziness and he is mildly bradycardic.  We Wray Goehring thus cut his amiodarone dose in half today.  Otherwise, amiodarone appears to be suppressing his PVCs.  2. Pseudobradycardia: likely due to high PVC burden. No changes.  3. Hypertension: Currently well controlled  4. Hyperlipidemia: Pravastatin per primary cardiology  Current medicines are reviewed at length with the patient today.   The patient does not have concerns regarding his medicines.  The following changes were made today: None  Labs/ tests ordered today include:  Orders Placed This Encounter  Procedures  . EKG 12-Lead     Disposition:   FU with Christee Mervine 6 months  Signed, Shaunna Rosetti Meredith Leeds, MD  08/26/2019 11:21 AM     [facility] [address] [place] [address] [phone] (office) [phone] (fax)

## 2019-09-09 DIAGNOSIS — Z95828 Presence of other vascular implants and grafts: Secondary | ICD-10-CM | POA: Diagnosis not present

## 2019-09-09 DIAGNOSIS — Z9221 Personal history of antineoplastic chemotherapy: Secondary | ICD-10-CM | POA: Diagnosis not present

## 2019-09-09 DIAGNOSIS — C9110 Chronic lymphocytic leukemia of B-cell type not having achieved remission: Secondary | ICD-10-CM | POA: Diagnosis not present

## 2019-09-09 DIAGNOSIS — M545 Low back pain: Secondary | ICD-10-CM | POA: Diagnosis not present

## 2019-09-21 NOTE — Progress Notes (Signed)
Cardiology Office Note:    Date:  09/23/2019   ID:  Jesse [name], DOB December 23, 1940, MRN [id number]  PCP:  Rochel Brome, MD  Cardiologist:  Shirlee More, MD    Referring MD: Rochel Brome, MD    ASSESSMENT:    1. PVC's (premature ventricular contractions)   2. On amiodarone therapy   3. Chronic diastolic heart failure ([name])   4. Hypertensive heart disease with heart failure ([name])   5. Hyperlipidemia, unspecified hyperlipidemia type    PLAN:    In order of problems listed above:  1. Improved having no inducible or ambient PVCs continue minimum dose amiodarone requires labs today liver function thyroid regarding amiodarone toxicity 2. Good clinical response continue low-dose amiodarone 3. Stable compensated [place] Heart Association class I continue his self-management sodium restriction and current loop diuretic 4. Stable BP at target continue current treatment diuretic 5. Stable lipids are at target continue a statin well-tolerated 6. CAD stable having no angina continue current medical treatment Heart Association class I.  Next visit will address wise not on antiplatelet therapy by my memory is it was due to his hematologic malignancy 7. Strongly encouraged him to accept vaccine and continue his current protective mechanisms including wearing eye protection   Next appointment: 6 months   Medication Adjustments/Labs and Tests Ordered: Current medicines are reviewed at length with the patient today.  Concerns regarding medicines are outlined above.  No orders of the defined types were placed in this encounter.  No orders of the defined types were placed in this encounter.   Chief Complaint  Patient presents with  . Follow-up    Frequent PVCs on amiodarone  . Congestive Heart Failure  . Aortic Stenosis  . Hypertension    History of Present Illness:    Jesse [name] is a 79 y.o. male with a hx of heart failure and mild AS    last seen 06/20/2018.he had frequent PVC's  seen by EP and placed on amiodarone. Compliance with diet, lifestyle and medications: Yes  Reviewed the EP study results with the patient he has good healthcare literacy and understood that he did not have inducible ventricular arrhythmia and has done well with his beta-blocker with reduced dose I reviewed the EP visit from 08/26/2019 including EKG  EP study 04/24/2019: Conclusion Patient brought into the EP lab for possible ablation of PVCs.  On presentation the lab, patient was in sinus rhythm with heart rates in the 60s and 0 PVCs.  Isoproterenol was started and increased up to 8 mcg/kg/min.  The patient continued to have no PVCs which also continued through Isopril washout.  Due to the lack of PVCs, no ablation was performed.   He is feeling well he is having no palpitation does not have the weakness and lightheadedness he had previously with frequent PVCs his resting heart rate at home runs 49 to 50 bpm he has had no lightheadedness palpitation chest pain edema shortness of breath.  I reviewed his EKG done recently and advised him to have lab work tells me is seen Dr. Tobie Poet today and I hand wrote a note asking her to check a CMP TSH T3-T4 on amiodarone treatment.  He has gained a few pounds in the last few months and is at risk for drug-induced hypothyroidism.  His heart failure is well compensated tolerates his diuretic has no edema and he tolerates lipid-lowering therapy without muscle weakness or pain.  His CLL is stable. Past Medical History:  Diagnosis Date  .  Anemia    takes Ferrous Sulfate daily  . Anxiety    takes Xanax daily  . Arthritis   . BPH (benign prostatic hyperplasia) 10/25/2017  . Bradycardia 10/25/2017  . CAD (coronary artery disease) 10/25/2017  . Chest pain 10/25/2017  . Chronic back pain   . Chronic ITP (idiopathic thrombocytopenia) (H[facility]) 08/08/2013  . Chronic lymphocytic leukemia ([place]) 10/25/2017  . Constipation    takes Colace daily as needed  . Dry eyes    eye drops as  needed  . Dyspnea on exertion 10/25/2017  . History of blood transfusion    no abnormal reaction noted  . History of kidney stones   . Hyperlipemia 10/25/2017  . Hyperlipidemia    takes Pravastatin daily  . Infusion reaction 4 yrs ago   platelet infusion broke out head to toe  . Insomnia    takes trazodone nightly as needed  . Leukemia ([place])   . Low back pain 12/12/2012  . Lumbar radiculopathy 01/09/2013  . Muscle spasm    takes Robaxin daily as needed  . Nerve pain    takes Gabapentin daily   . Nocturia   . Renal stones 10/25/2017  . Status post lumbar surgery 12/02/2015  . Thrombocytopenia, secondary 10/25/2017   Overview:  secondary to CLL  . Vitamin B12 deficiency    takes Vit B12 daily  . Weakness    numbness and tingling    Past Surgical History:  Procedure Laterality Date  . ANTERIOR LAT LUMBAR FUSION N/A 12/02/2015   Procedure: L3-4 ANTERIOR LATERAL LUMBAR FUSION w/lateral plate;  Surgeon: Eustace Moore, MD;  Location: [facility];  Service: Neurosurgery;  Laterality: N/A;  L3-4 ANTERIOR LATERAL LUMBAR FUSION w/lateral plate  . BACK SURGERY  2012   fusion x 2  . CATARACT EXTRACTION    . CERVICAL FUSION  1988   x 2  . COLONOSCOPY    . FOOT SURGERY    . HERNIA REPAIR Left    inguinal  . KNEE SURGERY    . LAMINECTOMY    . plates/screws/rods removed  2015  . PVC ABLATION N/A 04/24/2019   Procedure: PVC ABLATION;  Surgeon: Constance Haw, MD;  Location: [facility];  Service: Cardiovascular;  Laterality: N/A;  . TOE SURGERY    . TRANSURETHRAL RESECTION OF PROSTATE      Current Medications: Current Meds  Medication Sig  . ALPRAZolam (XANAX) 0.5 MG tablet Take 0.5 mg by mouth 2 (two) times daily.   Marland Kitchen amiodarone (PACERONE) 100 MG tablet Take 1 tablet (100 mg total) by mouth daily.  . B Complex-C (B-COMPLEX WITH VITAMIN C) tablet Take 1 tablet by mouth daily.  . folic acid (FOLVITE) 1 MG tablet Take 1 mg by mouth daily.   . furosemide (LASIX) 40 MG tablet  Take 0.5 tablets (20 mg total) by mouth daily. Increase to whole tab if weight 3 lbs greater than baseline NEED OFFICE VISIT FOR MORE REFILLS  . gabapentin (NEURONTIN) 600 MG tablet Take 600 mg by mouth 3 (three) times daily.  Marland Kitchen HOMEOPATHIC PRODUCTS PO Take 10 drops by mouth daily. Liquid No! Muscle Cramps  . levothyroxine (SYNTHROID) 100 MCG tablet Take 100 mcg by mouth daily before breakfast.  . magnesium oxide (MAG-OX) 400 MG tablet Take 400 mg by mouth daily as needed (leg cramps).   . methocarbamol (ROBAXIN) 750 MG tablet Take 750 mg by mouth 4 (four) times daily.   . Methotrexate, Anti-Rheumatic, (METHOTREXATE, PF, [place]) Inject 7.5  mg into the skin every Sunday. 0.3 ML (25 MG/ML)  . oxyCODONE-acetaminophen (PERCOCET) 10-325 MG tablet Take 1 tablet by mouth every 6 (six) hours as needed for pain.  . pravastatin (PRAVACHOL) 80 MG tablet Take 80 mg by mouth every evening.   . Probiotic Product (PROBIOTIC DAILY PO) Take 1 capsule by mouth daily.  . Tetrahydrozoline-PEG (EYE DROPS EXTRA OP) Apply 1-2 drops to eye as needed.  . traZODone (DESYREL) 50 MG tablet Take 50-100 mg by mouth at bedtime as needed for sleep.      Allergies:   Patient has no known allergies.   Social History   Socioeconomic History  . Marital status: Married    Spouse name: Not on file  . Number of children: Not on file  . Years of education: Not on file  . Highest education level: Not on file  Occupational History  . Not on file  Tobacco Use  . Smoking status: Never Smoker  . Smokeless tobacco: Never Used  Substance and Sexual Activity  . Alcohol use: No  . Drug use: No  . Sexual activity: Not Currently  Other Topics Concern  . Not on file  Social History Narrative  . Not on file   Social Determinants of Health   Financial Resource Strain:   . Difficulty of Paying Living Expenses: Not on file  Food Insecurity:   . Worried About Charity fundraiser in the Last Year: Not on file  . Ran Out of Food in  the Last Year: Not on file  Transportation Needs:   . Lack of Transportation (Medical): Not on file  . Lack of Transportation (Non-Medical): Not on file  Physical Activity:   . Days of Exercise per Week: Not on file  . Minutes of Exercise per Session: Not on file  Stress:   . Feeling of Stress : Not on file  Social Connections:   . Frequency of Communication with Friends and Family: Not on file  . Frequency of Social Gatherings with Friends and Family: Not on file  . Attends Religious Services: Not on file  . Active Member of Clubs or Organizations: Not on file  . Attends Archivist Meetings: Not on file  . Marital Status: Not on file     Family History: The patient's family history includes CAD in his brother and father; Congestive Heart Failure in his brother; Kidney failure in his brother; Leukemia in his brother; Prostate cancer in his brother; Throat cancer in his mother. ROS:   Please see the history of present illness.    All other systems reviewed and are negative.  EKGs/Labs/Other Studies Reviewed:    The following studies were reviewed today:  EKG:  EKG personally reviewed demonstrates sinus bradycardia 08/26/2019 left axis deviation nonspecific conduction delay consider old septal infarction  Recent Labs:  06/19/2019 cholesterol 127 HDL 31 LDL 68 his last TSH 03/18/2019 normal 2.42 04/24/2019: Hemoglobin 12.2; Platelets 113  Recent Lipid Panel No results found for: CHOL, TRIG, HDL, CHOLHDL, VLDL, LDLCALC, LDLDIRECT  Physical Exam:    VS:  BP 130/70 (BP Location: Right Arm, Patient Position: Sitting, Cuff Size: Normal)   Pulse (!) 49   Ht 5\' 9"  (1.753 m)   Wt 188 lb (85.3 kg)   SpO2 96%   BMI 27.76 kg/m     Wt Readings from Last 3 Encounters:  09/23/19 188 lb (85.3 kg)  08/26/19 185 lb 3.2 oz (84 kg)  06/03/19 181 lb 12.8 oz (82.5 kg)  GEN:  Well nourished, well developed in no acute distress HEENT: Normal NECK: No JVD; No carotid  bruits LYMPHATICS: No lymphadenopathy CARDIAC: RRR, no murmurs, rubs, gallops RESPIRATORY:  Clear to auscultation without rales, wheezing or rhonchi  ABDOMEN: Soft, non-tender, non-distended MUSCULOSKELETAL:  No edema; No deformity  SKIN: Warm and dry NEUROLOGIC:  Alert and oriented x 3 PSYCHIATRIC:  Normal affect    Signed, Shirlee More, MD  09/23/2019 9:34 AM    [place]

## 2019-09-23 ENCOUNTER — Encounter: Payer: Self-pay | Admitting: Cardiology

## 2019-09-23 ENCOUNTER — Ambulatory Visit (INDEPENDENT_AMBULATORY_CARE_PROVIDER_SITE_OTHER): Payer: Medicare Other | Admitting: Cardiology

## 2019-09-23 ENCOUNTER — Other Ambulatory Visit: Payer: Self-pay

## 2019-09-23 VITALS — BP 130/70 | HR 49 | Ht 69.0 in | Wt 188.0 lb

## 2019-09-23 DIAGNOSIS — I110 Hypertensive heart disease with heart failure: Secondary | ICD-10-CM

## 2019-09-23 DIAGNOSIS — I5032 Chronic diastolic (congestive) heart failure: Secondary | ICD-10-CM | POA: Diagnosis not present

## 2019-09-23 DIAGNOSIS — E782 Mixed hyperlipidemia: Secondary | ICD-10-CM | POA: Diagnosis not present

## 2019-09-23 DIAGNOSIS — C9110 Chronic lymphocytic leukemia of B-cell type not having achieved remission: Secondary | ICD-10-CM | POA: Diagnosis not present

## 2019-09-23 DIAGNOSIS — Z7189 Other specified counseling: Secondary | ICD-10-CM

## 2019-09-23 DIAGNOSIS — Z79899 Other long term (current) drug therapy: Secondary | ICD-10-CM

## 2019-09-23 DIAGNOSIS — E785 Hyperlipidemia, unspecified: Secondary | ICD-10-CM | POA: Diagnosis not present

## 2019-09-23 DIAGNOSIS — I25118 Atherosclerotic heart disease of native coronary artery with other forms of angina pectoris: Secondary | ICD-10-CM

## 2019-09-23 DIAGNOSIS — I493 Ventricular premature depolarization: Secondary | ICD-10-CM | POA: Diagnosis not present

## 2019-09-23 DIAGNOSIS — I10 Essential (primary) hypertension: Secondary | ICD-10-CM | POA: Diagnosis not present

## 2019-09-23 NOTE — Patient Instructions (Signed)
Medication Instructions:  Your physician recommends that you continue on your current medications as directed. Please refer to the Current Medication list given to you today.  *If you need a refill on your cardiac medications before your next appointment, please call your pharmacy*  Lab Work: None.  If you have labs (blood work) drawn today and your tests are completely normal, you will receive your results only by: Marland Kitchen MyChart Message (if you have MyChart) OR . A paper copy in the mail If you have any lab test that is abnormal or we need to change your treatment, we will call you to review the results.  Testing/Procedures: None.    Follow-Up: At [facility], you and your health needs are our priority.  As part of our continuing mission to provide you with exceptional heart care, we have created designated Provider Care Teams.  These Care Teams include your primary Cardiologist (physician) and Advanced Practice Providers (APPs -  Physician Assistants and Nurse Practitioners) who all work together to provide you with the care you need, when you need it.  Your next appointment:   6 month(s)  The format for your next appointment:   In Person  Provider:   Shirlee More, MD  Other Instructions

## 2019-09-25 DIAGNOSIS — E782 Mixed hyperlipidemia: Secondary | ICD-10-CM | POA: Diagnosis not present

## 2019-09-25 DIAGNOSIS — I2510 Atherosclerotic heart disease of native coronary artery without angina pectoris: Secondary | ICD-10-CM | POA: Diagnosis not present

## 2019-09-25 DIAGNOSIS — E038 Other specified hypothyroidism: Secondary | ICD-10-CM | POA: Diagnosis not present

## 2019-09-25 DIAGNOSIS — D6949 Other primary thrombocytopenia: Secondary | ICD-10-CM | POA: Diagnosis not present

## 2019-10-31 ENCOUNTER — Other Ambulatory Visit: Payer: Self-pay | Admitting: Physician Assistant

## 2019-10-31 MED ORDER — OXYCODONE-ACETAMINOPHEN 10-325 MG PO TABS: 1.0000 | ORAL_TABLET | Freq: Four times a day (QID) | ORAL | 0 refills | Status: DC | PRN

## 2019-11-04 ENCOUNTER — Other Ambulatory Visit: Payer: Self-pay | Admitting: Family Medicine

## 2019-11-04 ENCOUNTER — Other Ambulatory Visit: Payer: Self-pay

## 2019-11-04 MED ORDER — PRAVASTATIN SODIUM 80 MG PO TABS: 80.0000 mg | ORAL_TABLET | Freq: Every evening | ORAL | 1 refills | Status: DC

## 2019-11-04 MED ORDER — OXYCODONE-ACETAMINOPHEN 10-325 MG PO TABS: 1.4000 | ORAL_TABLET | Freq: Four times a day (QID) | ORAL | 0 refills | Status: DC | PRN

## 2019-11-11 ENCOUNTER — Other Ambulatory Visit: Payer: Self-pay

## 2019-11-11 MED ORDER — ALPRAZOLAM 0.5 MG PO TABS: 0.5000 mg | ORAL_TABLET | Freq: Two times a day (BID) | ORAL | 1 refills | Status: DC

## 2019-11-18 ENCOUNTER — Other Ambulatory Visit: Payer: Self-pay

## 2019-11-18 MED ORDER — PRAVASTATIN SODIUM 80 MG PO TABS: 80.0000 mg | ORAL_TABLET | Freq: Every evening | ORAL | 1 refills | Status: DC

## 2019-11-21 ENCOUNTER — Telehealth: Payer: Self-pay | Admitting: Cardiology

## 2019-11-21 MED ORDER — AMIODARONE HCL 100 MG PO TABS: 100.0000 mg | ORAL_TABLET | Freq: Every day | ORAL | 6 refills | Status: DC

## 2019-11-21 NOTE — Telephone Encounter (Signed)
Had tried to call wife back to discuss if pt needed Rx sent in. Wife returned my call (no answer when I called back). Rx sent in to pharmacy per request. Wife appreciates the help.

## 2019-11-21 NOTE — Telephone Encounter (Signed)
Pt c/o medication issue:  1. Name of Medication: amiodarone (PACERONE) 100 MG tablet  2. How are you currently taking this medication (dosage and times per day)? 2 50 MG tablets daily   3. Are you having a reaction (difficulty breathing--STAT)? No  4. What is your medication issue? Mechele Claude the patient's husband is calling stating she was unable to pick up the patient's medication from the pharmacy due to it being 16 more days before the refill is due. She states the pharmacy advised her to have a new prescription sent over to override the old one. Jori Moll has began taking two 50 MG tablets daily today until this issue is resolved. Please advise.

## 2019-11-21 NOTE — Addendum Note (Signed)
Addended by: Stanton Kidney on: 11/21/2019 03:18 PM   Modules accepted: Orders

## 2019-11-21 NOTE — Telephone Encounter (Signed)
Wife reports low HRs over last several day.  Pt feeling very fatigued/draggy w/ no energy.  If he sits down to play a game of solitaire/watch tv "he will just fall asleep" - this is not usual for him.  Reports HR in 20s according to pulse ox, HR in 40s according to BP cuff.  Aware that if abnormal rhythm/PVCs then they would not necessarily get the correct HR and pt would need an EKG for confirmation.  She did take manual pulse and stated it felt abnormal. Pt scheduled to follow up on Monday w/ Dr. Curt Bears for EKG. If she feels pt needs to be seen sooner she will take him to PCP/ED Advised to go to nearest fire dept if continues so they can perform an ekg.  Advised if worsens to call 911/go to ED. Wife agreeable to plan.

## 2019-11-25 ENCOUNTER — Encounter: Payer: Self-pay | Admitting: Cardiology

## 2019-11-25 ENCOUNTER — Other Ambulatory Visit: Payer: Self-pay

## 2019-11-25 ENCOUNTER — Ambulatory Visit (INDEPENDENT_AMBULATORY_CARE_PROVIDER_SITE_OTHER): Payer: Medicare Other | Admitting: Cardiology

## 2019-11-25 VITALS — BP 104/62 | HR 29 | Ht 70.0 in | Wt 189.0 lb

## 2019-11-25 DIAGNOSIS — I493 Ventricular premature depolarization: Secondary | ICD-10-CM

## 2019-11-25 MED ORDER — MEXILETINE HCL 250 MG PO CAPS: 250.0000 mg | ORAL_CAPSULE | Freq: Two times a day (BID) | ORAL | 3 refills | Status: DC

## 2019-11-25 NOTE — Patient Instructions (Addendum)
Medication Instructions:  Your physician has recommended you make the following change in your medication:  1. STOP Amiodarone 2. START Mexiletine 250 mg twice a day  *If you need a refill on your cardiac medications before your next appointment, please call your pharmacy*   Lab Work: None ordered If you have labs (blood work) drawn today and your tests are completely normal, you will receive your results only by: Marland Kitchen MyChart Message (if you have MyChart) OR . A paper copy in the mail If you have any lab test that is abnormal or we need to change your treatment, we will call you to review the results.   Testing/Procedures: None ordered   Follow-Up: At [facility], you and your health needs are our priority.  As part of our continuing mission to provide you with exceptional heart care, we have created designated Provider Care Teams.  These Care Teams include your primary Cardiologist (physician) and Advanced Practice Providers (APPs -  Physician Assistants and Nurse Practitioners) who all work together to provide you with the care you need, when you need it.  We recommend signing up for the patient portal called "MyChart".  Sign up information is provided on this After Visit Summary.  MyChart is used to connect with patients for Virtual Visits (Telemedicine).  Patients are able to view lab/test results, encounter notes, upcoming appointments, etc.  Non-urgent messages can be sent to your provider as well.   To learn more about what you can do with MyChart, go to [email or web address].    Your next appointment:   3 month(s)  The format for your next appointment:   In Person  Provider:   Allegra Lai, MD   Other Instructions  Mexiletine capsules What is this medicine? MEXILETINE (mex [place] e teen) is an antiarrhythmic agent. This medicine is used to treat irregular heart rhythm and can slow rapid heartbeats. It can help your heart to return to and maintain a normal rhythm.  Because of the side effects caused by this medicine, it is usually used for heartbeat problems that may be life-threatening. This medicine may be used for other purposes; ask your health care provider or pharmacist if you have questions. COMMON BRAND NAME(S): Mexitil What should I tell my health care provider before I take this medicine? They need to know if you have any of these conditions:  liver disease  other heart problems  previous heart attack  an unusual or allergic reaction to mexiletine, other medicines, foods, dyes, or preservatives  pregnant or trying to get pregnant  breast-feeding How should I use this medicine? Take this medicine by mouth with a glass of water. Follow the directions on the prescription label. It is recommended that you take this medicine with food or an antacid. Take your doses at regular intervals. Do not take your medicine more often than directed. Do not stop taking except on the advice of your doctor or health care professional. Talk to your pediatrician regarding the use of this medicine in children. Special care may be needed. Overdosage: If you think you have taken too much of this medicine contact a poison control center or emergency room at once. NOTE: This medicine is only for you. Do not share this medicine with others. What if I miss a dose? If you miss a dose, take it as soon as you can. If it is almost time for your next dose, take only that dose. Do not take double or extra doses. What may interact with this  medicine? Do not take this medicine with any of the following medications:  dofetilide This medicine may also interact with the following medications:  caffeine  cimetidine  medicines for depression, anxiety, or psychotic disturbances  medicines to control heart rhythm  phenobarbital  phenytoin  rifampin  theophylline This list may not describe all possible interactions. Give your health care provider a list of all the  medicines, herbs, non-prescription drugs, or dietary supplements you use. Also tell them if you smoke, drink alcohol, or use illegal drugs. Some items may interact with your medicine. What should I watch for while using this medicine? Your condition will be monitored closely when you first begin therapy. Often, this drug is first started in a hospital or other monitored health care setting. Once you are on maintenance therapy, visit your doctor or health care provider for regular checks on your progress. Because your condition and use of this medicine carry some risk, it is a good idea to carry an identification card, necklace or bracelet with details of your condition, medications, and doctor or health care provider. You may get drowsy or dizzy. Do not drive, use machinery, or do anything that needs mental alertness until you know how this medicine affects you. Do not stand or sit up quickly, especially if you are an older patient. This reduces the risk of dizzy or fainting spells. Alcohol can make you more dizzy, increase flushing and rapid heartbeats. Avoid alcoholic drinks. This medicine may cause serious skin reactions. They can happen weeks to months after starting the medicine. Contact your health care provider right away if you notice fevers or flu-like symptoms with a rash. The rash may be red or purple and then turn into blisters or peeling of the skin. Or, you might notice a red rash with swelling of the face, lips or lymph nodes in your neck or under your arms. What side effects may I notice from receiving this medicine? Side effects that you should report to your doctor or health care professional as soon as possible:  allergic reactions like skin rash, itching or hives, swelling of the face, lips, or tongue  breathing problems  chest pain, continued irregular heartbeats  rash, fever, and swollen lymph nodes  redness, blistering, peeling or loosening of the skin, including inside the  mouth  seizures  skin rash  trembling, shaking  unusual bleeding or bruising  unusually weak or tired Side effects that usually do not require medical attention (report to your doctor or health care professional if they continue or are bothersome):  blurred vision  difficulty walking  heartburn  nausea, vomiting  nervousness  numbness, or tingling in the fingers or toes This list may not describe all possible side effects. Call your doctor for medical advice about side effects. You may report side effects to FDA at 1-800-FDA-1088. Where should I keep my medicine? Keep out of reach of children. Store at room temperature between 15 and 30 degrees C (59 and 86 degrees F). Throw away any unused medicine after the expiration date. NOTE: This sheet is a summary. It may not cover all possible information. If you have questions about this medicine, talk to your doctor, pharmacist, or health care provider.  2020 Elsevier/Gold Standard (2018-12-12 09:25:42)

## 2019-11-25 NOTE — Progress Notes (Signed)
Electrophysiology Office Note   Date:  11/25/2019   ID:  Jesse [name], DOB 1941-05-19, MRN [id number]  PCP:  Jesse Brome, MD  Cardiologist:  Jesse [name] Primary Electrophysiologist:  Jesse Haw, MD    No chief complaint on file.    History of Present Illness: Jesse [name] is a 79 y.o. male who is being seen today for the evaluation of PVCs at the request of Jesse [name]. Presenting today for electrophysiology evaluation.  Has a history significant for coronary artery disease, ITP, CLL, hypertension, hyperlipidemia.  He wore recent Holter monitor that showed 6.1% PVCs with episodes of bigeminy and trigeminy.  He has had some bradycardia associated with his PVCs.  Main symptoms associated with PVCs are weakness, fatigue, and shortness of breath.  He has no chest pain.  He does note that his heart rate is in the 30s at times.  Recent attempt at ablation, but unfortunately had no PVCs on arrival to the hospital.  Today, denies symptoms of palpitations, chest pain, shortness of breath, orthopnea, PND, lower extremity edema, claudication, dizziness, presyncope, syncope, bleeding, or neurologic sequela. The patient is tolerating medications without difficulties.  He continues to have issues with PVCs.  He does not have palpitations but does have weakness and fatigue.  He has noted that his heart rates are slow at times.  His rates get down into the 20s.  Both him and his wife feel that this is due to his PVCs and pseudobradycardia.  He has decreased his amiodarone to 50 mg a day.  He tried increasing the dose again, but this made him feel tired.   Past Medical History:  Diagnosis Date  . Anemia    takes Ferrous Sulfate daily  . Anxiety    takes Xanax daily  . Arthritis   . BPH (benign prostatic hyperplasia) 10/25/2017  . Bradycardia 10/25/2017  . CAD (coronary artery disease) 10/25/2017  . Chest pain 10/25/2017  . Chronic back pain   . Chronic ITP (idiopathic thrombocytopenia) (HCC) 08/08/2013   . Chronic lymphocytic leukemia ([place]) 10/25/2017  . Constipation    takes Colace daily as needed  . Dry eyes    eye drops as needed  . Dyspnea on exertion 10/25/2017  . History of blood transfusion    no abnormal reaction noted  . History of kidney stones   . Hyperlipemia 10/25/2017  . Hyperlipidemia    takes Pravastatin daily  . Infusion reaction 4 yrs ago   platelet infusion broke out head to toe  . Insomnia    takes trazodone nightly as needed  . Leukemia ([place])   . Low back pain 12/12/2012  . Lumbar radiculopathy 01/09/2013  . Muscle spasm    takes Robaxin daily as needed  . Nerve pain    takes Gabapentin daily   . Nocturia   . Renal stones 10/25/2017  . Status post lumbar surgery 12/02/2015  . Thrombocytopenia, secondary 10/25/2017   Overview:  secondary to CLL  . Vitamin B12 deficiency    takes Vit B12 daily  . Weakness    numbness and tingling   Past Surgical History:  Procedure Laterality Date  . ANTERIOR LAT LUMBAR FUSION N/A 12/02/2015   Procedure: L3-4 ANTERIOR LATERAL LUMBAR FUSION w/lateral plate;  Surgeon: Jesse Moore, MD;  Location: [facility];  Service: Neurosurgery;  Laterality: N/A;  L3-4 ANTERIOR LATERAL LUMBAR FUSION w/lateral plate  . BACK SURGERY  2012   fusion x 2  . CATARACT EXTRACTION    .  CERVICAL FUSION  1988   x 2  . COLONOSCOPY    . FOOT SURGERY    . HERNIA REPAIR Left    inguinal  . KNEE SURGERY    . LAMINECTOMY    . plates/screws/rods removed  2015  . PVC ABLATION N/A 04/24/2019   Procedure: PVC ABLATION;  Surgeon: Jesse Haw, MD;  Location: [facility];  Service: Cardiovascular;  Laterality: N/A;  . TOE SURGERY    . TRANSURETHRAL RESECTION OF PROSTATE       Current Outpatient Medications  Medication Sig Dispense Refill  . ALPRAZolam (XANAX) 0.5 MG tablet Take 0.5 mg by mouth 2 (two) times daily.    Marland Kitchen amiodarone (PACERONE) 100 MG tablet Take 50 mg by mouth daily.    . B Complex-C (B-COMPLEX WITH VITAMIN C) tablet Take 1  tablet by mouth daily.    . folic acid (FOLVITE) 1 MG tablet Take 1 mg by mouth daily.     . furosemide (LASIX) 40 MG tablet Take 0.5 tablets (20 mg total) by mouth daily. Increase to whole tab if weight 3 lbs greater than baseline NEED OFFICE VISIT FOR [name] REFILLS 45 tablet 0  . gabapentin (NEURONTIN) 600 MG tablet Take 600 mg by mouth 3 (three) times daily.    Marland Kitchen levothyroxine (SYNTHROID) 100 MCG tablet Take 100 mcg by mouth daily before breakfast.    . magnesium oxide (MAG-OX) 400 MG tablet Take 400 mg by mouth daily as needed (leg cramps).     . methocarbamol (ROBAXIN) 750 MG tablet Take 750 mg by mouth 4 (four) times daily.     . Methotrexate, Anti-Rheumatic, (METHOTREXATE, PF, [place]) Inject 7.5 mg into the skin every Sunday. 0.3 ML (25 MG/ML)    . oxyCODONE-acetaminophen (PERCOCET) 10-325 MG tablet Take 1.5 tablets by mouth every 6 (six) hours as needed for up to 150 doses for pain (back pain). 150 tablet 0  . pravastatin (PRAVACHOL) 80 MG tablet Take 1 tablet (80 mg total) by mouth every evening. 90 tablet 1  . Tetrahydrozoline-PEG (EYE DROPS EXTRA OP) Apply 1-2 drops to eye as needed.    . traZODone (DESYREL) 50 MG tablet Take 50-100 mg by mouth at bedtime as needed for sleep.     Marland Kitchen mexiletine (MEXITIL) 250 MG capsule Take 1 capsule (250 mg total) by mouth 2 (two) times daily. 60 capsule 3   No current facility-administered medications for this visit.    Allergies:   Patient has no known allergies.   Social History:  The patient  reports that he has never smoked. He has never used smokeless tobacco. He reports that he does not drink alcohol or use drugs.   Family History:  The patient's family history includes CAD in his brother and father; Congestive Heart Failure in his brother; Kidney failure in his brother; Leukemia in his brother; Prostate cancer in his brother; Throat cancer in his mother.   ROS:  Please see the history of present illness.   Otherwise, review of systems is positive  for none.   All other systems are reviewed and negative.   PHYSICAL EXAM: VS:  BP 104/62   Pulse (!) 29   Ht 5\' 10"  (1.778 m)   Wt 189 lb (85.7 kg)   SpO2 96%   BMI 27.12 kg/m  , BMI Body mass index is 27.12 kg/m. GEN: Well nourished, well developed, in no acute distress  HEENT: normal  Neck: no JVD, carotid bruits, or masses Cardiac: irregular; no  murmurs, rubs, or gallops,no edema  Respiratory:  clear to auscultation bilaterally, normal work of breathing GI: soft, nontender, nondistended, + BS MS: no deformity or atrophy  Skin: warm and dry Neuro:  Strength and sensation are intact Psych: euthymic mood, full affect  EKG:  EKG is ordered today. Personal review of the ekg ordered shows, 53, PVCs  Recent Labs: 04/24/2019: Hemoglobin 12.2; Platelets 113    Lipid Panel  No results found for: CHOL, TRIG, HDL, CHOLHDL, VLDL, LDLCALC, LDLDIRECT   Wt Readings from Last 3 Encounters:  11/25/19 189 lb (85.7 kg)  09/23/19 188 lb (85.3 kg)  08/26/19 185 lb 3.2 oz (84 kg)      Other studies Reviewed: Additional studies/ records that were reviewed today include: TTE 11/20/17  Review of the above records today demonstrates:  - Left ventricle: The cavity size was normal. Wall thickness was   increased in a pattern of mild LVH. Systolic function was normal.   The estimated ejection fraction was in the range of 55% to 60%.   Wall motion was normal; there were no regional wall motion   abnormalities. Features are consistent with a pseudonormal left   ventricular filling pattern, with concomitant abnormal relaxation   and increased filling pressure (grade 2 diastolic dysfunction).   Doppler parameters are consistent with high ventricular filling   pressure. - Aortic valve: There was mild stenosis. Valve area (VTI): 2.52   cm^2. Valve area (Vmax): 2.38 cm^2. Valve area (Vmean): 2.2 cm^2. - Mitral valve: There was mild regurgitation. - Left atrium: The atrium was mildly dilated. -  Right atrium: The atrium was mildly dilated.  Cardiac Monitor 06/01/18 - personally reviewed The rhythm throughout his sinus with minimum average and maximum heart rates of 47, 61 and 127 bpm.  IVCD is noted.  The minimum rate is sinus bradycardia at 10:32 AM.  Ventricular ectopy, 6.1 % with frequent PVCs 309 couplets and one triplet.  There are episodes of bigeminy and trigeminy noted.  The episode of ventricular bigeminy persisted for 5 minutes and 46 seconds and trigeminy 1 minute and 34 seconds.  Supraventricular ectopy, 4.9% with rare couplets and no episodes of atrial tachycardia atrial fibrillation or flutter.  There were no bradycardic events, no episodes of sinus node or AV block or pauses of 3 seconds or greater.  There were no triggered or symptomatic events noted.  Conclusion, no significant bradycardia noted.  I suspect the episode of patient noted bradycardia with bigeminy and trigeminy with the pulse oximeter.  There are frequent PVCs and APCs that are asymptomatic.  ASSESSMENT AND PLAN:  1.  PVCs: Continue to have symptoms of weakness, fatigue, and shortness of breath.  He had an attempted ablation, but is not having PVCs that day.  Would hold off on any further procedures, as he has significant platelet issues.  He is currently on amiodarone, dose has been reduced by the patient.  He began feeling quite poorly with [name] PVCs.  He has some weakness and fatigue which could be due to amiodarone.  We Jesse [name] plan to stop that and start him on 250 mg mexiletine.  This can be titrated up if he continues to have issues.  2. Pseudobradycardia: Likely due to high PVC burden 3. Hypertension: Well-controlled  4. Hyperlipidemia: Pravastatin per primary cardiology  Current medicines are reviewed at length with the patient today.   The patient does not have concerns regarding his medicines.  The following changes were made today: Stop amiodarone, start mexiletine  Labs/ tests ordered  today include:  Orders Placed This Encounter  Procedures  . EKG 12-Lead     Disposition:   FU with Roseann Kees 3 months  Signed, Kaslyn Richburg Meredith Leeds, MD  11/25/2019 1:32 PM     [address] [place] [address] [phone] (office) [phone] (fax)

## 2019-11-27 ENCOUNTER — Other Ambulatory Visit: Payer: Self-pay | Admitting: Family Medicine

## 2019-12-02 ENCOUNTER — Telehealth: Payer: Self-pay | Admitting: Cardiology

## 2019-12-02 NOTE — Telephone Encounter (Signed)
Called and spoke to the patient's wife (DPR on file). She states that the patient had stopped the amiodarone and started the mexiletine 250 mg BID. She states that he took that for 5 days and fell twice. She states that he had the 150 mg tablets of mexiletine so he decreased it to taking 150 mg BID. She states that he feels a little better on the lower dose. She mentioned the patient wanting to get a pacemaker. Made her aware that they were going to treat his PVCs with medications. Apparently had attempted ablation in the past but the patient was not having PVCs at the time. Also, Dr. Curt Bears was wanting to hold off on any procedures at this time d/t his platelet issues. Instructed for the patient to continue taking the 150 mg of Mexiletine BID and I would forward to Dr. Curt Bears for review and recommendations. She verbalized understanding and thanked me for the call.

## 2019-12-02 NOTE — Telephone Encounter (Signed)
New message   Patient's wife has questions about patient having a pacemaker and also has questions about the patient's medications. Please call to discuss.

## 2019-12-06 NOTE — Telephone Encounter (Signed)
Spoke to pt.  He decreased Mexiletine to 150 mg BID a couple of weeks ago.  States he can't tell any difference.  He is not dizzy now, like he used to be.  He hasn't come close to falling in the last week as he did on higher Mexiletine dosing. His biggest concern/issue is that he has no energy - still.  Pt aware I will re-discuss treatment plan w/ Dr. Curt Bears and call pt next week to let him know recommendation. Pt aware he may order a holter monitor for PVC burden or may still recommend switching to Propafenone (225 BID)  prior to holter monitoring.  Patient verbalized understanding and agreeable to plan.

## 2019-12-10 DIAGNOSIS — L299 Pruritus, unspecified: Secondary | ICD-10-CM | POA: Diagnosis not present

## 2019-12-10 DIAGNOSIS — L821 Other seborrheic keratosis: Secondary | ICD-10-CM | POA: Diagnosis not present

## 2019-12-10 DIAGNOSIS — L300 Nummular dermatitis: Secondary | ICD-10-CM | POA: Diagnosis not present

## 2019-12-10 DIAGNOSIS — L570 Actinic keratosis: Secondary | ICD-10-CM | POA: Diagnosis not present

## 2019-12-11 NOTE — Telephone Encounter (Signed)
Advised to increase Mexiletine to 150 mg TID. Advised to call if no improvement/worsening symptoms. Pt will call me Monday to let me know how he is doing and if doing well send in updated Rx to pharmacy.

## 2019-12-12 ENCOUNTER — Other Ambulatory Visit: Payer: Self-pay

## 2019-12-13 MED ORDER — OXYCODONE-ACETAMINOPHEN 10-325 MG PO TABS: 1.4000 | ORAL_TABLET | Freq: Four times a day (QID) | ORAL | 0 refills | Status: DC | PRN

## 2019-12-16 DIAGNOSIS — L570 Actinic keratosis: Secondary | ICD-10-CM | POA: Diagnosis not present

## 2019-12-19 NOTE — Telephone Encounter (Signed)
Follow up  Pt's wife return call, she said pt still feels dizzy after taking Mexiletine 3x a day. She said pt has no improvement and would like to know what's the next recommendation  Please advise

## 2019-12-20 NOTE — Telephone Encounter (Signed)
Returned call to wife. Advised to hold Mexiletine. Aware Dr. Curt Bears will review chart next week and we will call w/ recommendation. Wife agreeable to plan,

## 2019-12-24 ENCOUNTER — Ambulatory Visit: Payer: Medicare Other

## 2019-12-24 ENCOUNTER — Other Ambulatory Visit: Payer: Self-pay

## 2019-12-24 ENCOUNTER — Other Ambulatory Visit: Payer: Self-pay | Admitting: Family Medicine

## 2019-12-24 DIAGNOSIS — C9110 Chronic lymphocytic leukemia of B-cell type not having achieved remission: Secondary | ICD-10-CM | POA: Diagnosis not present

## 2019-12-24 DIAGNOSIS — E782 Mixed hyperlipidemia: Secondary | ICD-10-CM

## 2019-12-25 ENCOUNTER — Encounter: Payer: Self-pay | Admitting: Family Medicine

## 2019-12-25 ENCOUNTER — Other Ambulatory Visit: Payer: Self-pay

## 2019-12-25 LAB — CBC WITH DIFFERENTIAL/PLATELET
Basophils Absolute: 0 10*3/uL (ref 0.0–0.2)
Basos: 1 %
EOS (ABSOLUTE): 0.2 10*3/uL (ref 0.0–0.4)
Eos: 7 %
Hematocrit: 36.9 % — ABNORMAL LOW (ref 37.5–51.0)
Hemoglobin: 12.3 g/dL — ABNORMAL LOW (ref 13.0–17.7)
Immature Grans (Abs): 0 10*3/uL (ref 0.0–0.1)
Immature Granulocytes: 1 %
Lymphocytes Absolute: 0.9 10*3/uL (ref 0.7–3.1)
Lymphs: 28 %
MCH: 31 pg (ref 26.6–33.0)
MCHC: 33.3 g/dL (ref 31.5–35.7)
MCV: 93 fL (ref 79–97)
Monocytes Absolute: 0.3 10*3/uL (ref 0.1–0.9)
Monocytes: 10 %
Neutrophils Absolute: 1.7 10*3/uL (ref 1.4–7.0)
Neutrophils: 53 %
Platelets: 46 10*3/uL — CL (ref 150–450)
RBC: 3.97 x10E6/uL — ABNORMAL LOW (ref 4.14–5.80)
RDW: 14.4 % (ref 11.6–15.4)
WBC: 3.1 10*3/uL — ABNORMAL LOW (ref 3.4–10.8)

## 2019-12-25 LAB — COMPREHENSIVE METABOLIC PANEL
ALT: 10 IU/L (ref 0–44)
AST: 33 IU/L (ref 0–40)
Albumin/Globulin Ratio: 2.6 — ABNORMAL HIGH (ref 1.2–2.2)
Albumin: 3.9 g/dL (ref 3.7–4.7)
Alkaline Phosphatase: 95 IU/L (ref 39–117)
BUN/Creatinine Ratio: 11 (ref 10–24)
BUN: 12 mg/dL (ref 8–27)
Bilirubin Total: 0.6 mg/dL (ref 0.0–1.2)
CO2: 24 mmol/L (ref 20–29)
Calcium: 8.6 mg/dL (ref 8.6–10.2)
Chloride: 106 mmol/L (ref 96–106)
Creatinine, Ser: 1.07 mg/dL (ref 0.76–1.27)
GFR calc Af Amer: 75 mL/min/{1.73_m2} (ref >59)
GFR calc non Af Amer: 65 mL/min/{1.73_m2} (ref >59)
Globulin, Total: 1.5 g/dL (ref 1.5–4.5)
Glucose: 95 mg/dL (ref 65–99)
Potassium: 4 mmol/L (ref 3.5–5.2)
Sodium: 144 mmol/L (ref 134–144)
Total Protein: 5.4 g/dL — ABNORMAL LOW (ref 6.0–8.5)

## 2019-12-25 LAB — LIPID PANEL
Chol/HDL Ratio: 3.4 ratio (ref 0.0–5.0)
Cholesterol, Total: 96 mg/dL — ABNORMAL LOW (ref 100–199)
HDL: 28 mg/dL — ABNORMAL LOW (ref >39)
LDL Chol Calc (NIH): 50 mg/dL (ref 0–99)
Triglycerides: 92 mg/dL (ref 0–149)
VLDL Cholesterol Cal: 18 mg/dL (ref 5–40)

## 2019-12-25 LAB — CARDIOVASCULAR RISK ASSESSMENT

## 2019-12-25 MED ORDER — METHOCARBAMOL 750 MG PO TABS: 750.0000 mg | ORAL_TABLET | Freq: Four times a day (QID) | ORAL | 0 refills | Status: DC

## 2019-12-25 NOTE — Telephone Encounter (Signed)
Pt c/o medication issue:  1. Name of Medication: amiodarone (PACERONE) 100 MG tablet  2. How are you currently taking this medication (dosage and times per day)? Not currently taking medication   3. Are you having a reaction (difficulty breathing--STAT)? No   4. What is your medication issue? Jesse [name] is calling stating [place] has already tried this medication as well and it also had the same side effects. Please advise.

## 2019-12-26 MED ORDER — SOTALOL HCL 80 MG PO TABS: 40.0000 mg | ORAL_TABLET | Freq: Two times a day (BID) | ORAL | 1 refills | Status: DC

## 2019-12-26 NOTE — Telephone Encounter (Signed)
Pt advised to start Sotalol 40 mg BID, per Dr. Curt Bears. Rx sent to [facility]. Pt will call if SE begin after medication start. Pt scheduled to follow up with Dr. Joya Gaskins office next Friday for follow up EKG in [place] office. Pt aware that if EKG looks good, and pt tolerating medication, we will increase to 80 mg BID. Aware he will need a follow up EKG a week later if increase med. Pt agreeable to plan.  Will forward to Dr. Bettina Gavia for his FYI of treatment plan  for visit next week.

## 2019-12-26 NOTE — Progress Notes (Signed)
Established Patient Office Visit  Subjective:  Patient ID: Jesse [name], male    DOB: Aug 31, 1941  Age: 79 y.o. MRN: [id number]  CC:  Chief Complaint  Patient presents with  . Hyperlipidemia  . Hypothyroidism  . Pain  . Hypertension    HPI Pt presents with hyperlipidemia.  Current treatment includes Pravachol.  Compliance with treatment has been good; he takes his medication as directed, maintains his low cholesterol diet, and follows up as directed.  He denies experiencing any hypercholesterolemia related symptoms.      Other specified hypothyroidism details; date of diagnosis 2014.  He is currently taking Synthroid, 100 mcg daily.  He denies any related symptoms.      Concerning atherosclerotic heart disease of native coronary artery without angina pectoris, he is here today for routine follow-up.  Currently, his treatment regimen consists of a diuretic ( furosemide ) and Pravachol.  Takes lasix occasionally.He denies chest pain and lower extremity edema.  Jesse [name] is compliant with low fat diet, low salt diet, tobacco avoidance, and taking medications as recommended and prescribed.      Jesse [name] presents with a diagnosis of chronic lymphocytic leukemia of B-cell type in remission.  The course has been stable and nonprogressive.  Sees Dr. Judene Companion presents with a diagnosis of thrombocytopenia and CLL. Most recent platelets were 43 earlier this week.  The course has been stable and nonprogressive.  Sees Dr. Judene Companion presents with a diagnosis of sick sinus syndrome.  Cardiology stopped amiodarone and started sotolol today. Did not end up getting the ablation due to covid 19 and then the medicine seemed to take care of it. If he worsens, they will reconsider the ablation.     Jesse [name] presents with a diagnosis of chronic pain syndrome.  The course has been stable and nonprogressive.  Low back pain details; his symptoms are stable since last visit. Concerning chronic low back pain,  the location is primarily in the lower lumbar spine. Pain 7-10/10. Patient takes Endocet 10/325 mg 1 1/2 THREE TIMES A DAY, Robaxin 750 mg one three times a day,  and Neurontin 600 mg THREE TIMES A DAY.    Past Medical History:  Diagnosis Date  . Anemia    takes Ferrous Sulfate daily  . Anxiety    takes Xanax daily  . Arthritis   . Atherosclerotic heart disease of native coronary artery without angina pectoris   . BPH (benign prostatic hyperplasia) 10/25/2017  . Bradycardia 10/25/2017  . CAD (coronary artery disease) 10/25/2017  . Chest pain 10/25/2017  . Chronic back pain   . Chronic ITP (idiopathic thrombocytopenia) (HCC) 08/08/2013  . Chronic lymphocytic leukemia ([place]) 10/25/2017  . Chronic lymphocytic leukemia of B-cell type not having achieved remission ([place])   . Chronic pain syndrome   . Constipation    takes Colace daily as needed  . Dry eyes    eye drops as needed  . Dyspnea on exertion 10/25/2017  . Essential (primary) hypertension   . History of blood transfusion    no abnormal reaction noted  . History of kidney stones   . Hyperlipemia 10/25/2017  . Hyperlipidemia    takes Pravastatin daily  . Hypertensive heart disease without heart failure   . Infusion reaction 4 yrs ago   platelet infusion broke out head to toe  . Insomnia    takes trazodone nightly as needed  . Leukemia ([place])   . Low back pain 12/12/2012  .  Lumbar radiculopathy 01/09/2013  . Major depressive disorder, single episode, mild ([place])   . Muscle spasm    takes Robaxin daily as needed  . Nerve pain    takes Gabapentin daily   . Nocturia   . Opioid dependence, uncomplicated ([place])   . Other primary thrombocytopenia ([place])   . Other specified hypothyroidism   . Other specified intestinal infections   . Renal stones 10/25/2017  . Status post lumbar surgery 12/02/2015  . Thrombocytopenia, secondary 10/25/2017   Overview:  secondary to CLL  . Vitamin B12 deficiency    takes Vit B12 daily  . Weakness    numbness and  tingling    Past Surgical History:  Procedure Laterality Date  . ANTERIOR LAT LUMBAR FUSION N/A 12/02/2015   Procedure: L3-4 ANTERIOR LATERAL LUMBAR FUSION w/lateral plate;  Surgeon: Eustace Moore, MD;  Location: [facility];  Service: Neurosurgery;  Laterality: N/A;  L3-4 ANTERIOR LATERAL LUMBAR FUSION w/lateral plate  . BACK SURGERY  2012   fusion x 2  . CATARACT EXTRACTION    . CERVICAL FUSION  1988   x 2  . COLONOSCOPY    . FOOT SURGERY    . HERNIA REPAIR Left    inguinal  . KNEE SURGERY    . LAMINECTOMY    . plates/screws/rods removed  2015  . PVC ABLATION N/A 04/24/2019   Procedure: PVC ABLATION;  Surgeon: Constance Haw, MD;  Location: [facility];  Service: Cardiovascular;  Laterality: N/A;  . TOE SURGERY    . TRANSURETHRAL RESECTION OF PROSTATE      Family History  Problem Relation Age of Onset  . Congestive Heart Failure Brother   . CAD Brother   . Leukemia Brother   . Prostate cancer Brother   . Kidney failure Brother   . Kidney Stones Brother   . Throat cancer Mother   . CAD Father     Social History   Socioeconomic History  . Marital status: Married    Spouse name: Not on file  . Number of children: 1  . Years of education: Not on file  . Highest education level: Not on file  Occupational History  . Not on file  Tobacco Use  . Smoking status: Never Smoker  . Smokeless tobacco: Never Used  Substance and Sexual Activity  . Alcohol use: No  . Drug use: No  . Sexual activity: Not Currently  Other Topics Concern  . Not on file  Social History Narrative  . Not on file   Social Determinants of Health   Financial Resource Strain:   . Difficulty of Paying Living Expenses:   Food Insecurity:   . Worried About Charity fundraiser in the Last Year:   . Arboriculturist in the Last Year:   Transportation Needs:   . Film/video editor (Medical):   Marland Kitchen Lack of Transportation (Non-Medical):   Physical Activity:   . Days of Exercise per  Week:   . Minutes of Exercise per Session:   Stress:   . Feeling of Stress :   Social Connections:   . Frequency of Communication with Friends and Family:   . Frequency of Social Gatherings with Friends and Family:   . Attends Religious Services:   . Active Member of Clubs or Organizations:   . Attends Archivist Meetings:   Marland Kitchen Marital Status:   Intimate Partner Violence:   . Fear of Current or Ex-Partner:   .  Emotionally Abused:   Marland Kitchen Physically Abused:   . Sexually Abused:     Outpatient Medications Prior to Visit  Medication Sig Dispense Refill  . ALPRAZolam (XANAX) 0.5 MG tablet Take 0.5 mg by mouth 2 (two) times daily.    . furosemide (LASIX) 20 MG tablet Take 20 mg by mouth daily as needed (feet swelling).    . gabapentin (NEURONTIN) 600 MG tablet TAKE 1 TABLET BY MOUTH 3  TIMES DAILY 270 tablet 3  . levothyroxine (SYNTHROID) 100 MCG tablet Take 100 mcg by mouth daily before breakfast.    . methocarbamol (ROBAXIN) 750 MG tablet Take 1 tablet (750 mg total) by mouth 4 (four) times daily. 360 tablet 0  . oxyCODONE-acetaminophen (PERCOCET) 10-325 MG tablet Take 1.5 tablets by mouth every 6 (six) hours as needed for up to 150 doses for pain (back pain). 150 tablet 0  . pravastatin (PRAVACHOL) 80 MG tablet Take 1 tablet (80 mg total) by mouth every evening. 90 tablet 1  . sotalol (BETAPACE) 80 MG tablet Take 0.5 tablets (40 mg total) by mouth 2 (two) times daily. 60 tablet 1  . Tetrahydrozoline-PEG (EYE DROPS EXTRA OP) Apply 1-2 drops to eye as needed.    . traZODone (DESYREL) 50 MG tablet Take 50-100 mg by mouth at bedtime as needed for sleep.      No facility-administered medications prior to visit.    Allergies  Allergen Reactions  . Flecainide   . Morphine And Related Other (See Comments)    sedation  . Niaspan [Niacin] Other (See Comments)    flushing  . Propafenone Other (See Comments)    somnolence  . Zoloft [Sertraline] Other (See Comments)    Drowsiness     ROS Review of Systems  Constitutional: Negative for chills, diaphoresis, fatigue and fever.  HENT: Negative for congestion, ear pain and sore throat.   Respiratory: Negative for cough and shortness of breath.   Cardiovascular: Positive for leg swelling (Left leg and ankle earlier in the week, took lasix which helped.). Negative for chest pain.  Gastrointestinal: Negative for abdominal pain, constipation, diarrhea, nausea and vomiting.  Genitourinary: Negative for dysuria and urgency.  Musculoskeletal: Positive for arthralgias and myalgias.  Neurological: Positive for dizziness. Negative for headaches.       Due to amiodarone. Discontinued.  Psychiatric/Behavioral: Negative for dysphoric mood. The patient is nervous/anxious.       Objective:    Physical Exam  Constitutional: He appears well-developed and well-nourished.  Cardiovascular: Normal rate, regular rhythm, normal heart sounds and intact distal pulses.  Pulmonary/Chest: Effort normal and breath sounds normal.  Musculoskeletal:        General: No edema.  Neurological: He is alert.  Psychiatric: He has a normal mood and affect. His behavior is normal.    BP 110/64 (BP Location: Left Arm, Patient Position: Sitting)   Pulse (!) 45   Temp (!) 97.3 F (36.3 C) (Temporal)   Ht 5\' 10"  (1.778 m)   Wt 184 lb (83.5 kg)   SpO2 98%   BMI 26.40 kg/m  Wt Readings from Last 3 Encounters:  12/27/19 184 lb (83.5 kg)  11/25/19 189 lb (85.7 kg)  09/23/19 188 lb (85.3 kg)     Health Maintenance Due  Topic Date Due  . TETANUS/TDAP  Never done    There are no preventive care reminders to display for this patient.  No results found for: TSH Lab Results  Component Value Date   WBC 3.1 (L) 12/24/2019  HGB 12.3 (L) 12/24/2019   HCT 36.9 (L) 12/24/2019   MCV 93 12/24/2019   PLT 46 (LL) 12/24/2019   Lab Results  Component Value Date   NA 144 12/24/2019   K 4.0 12/24/2019   CO2 24 12/24/2019   GLUCOSE 95 12/24/2019    BUN 12 12/24/2019   CREATININE 1.07 12/24/2019   BILITOT 0.6 12/24/2019   ALKPHOS 95 12/24/2019   AST 33 12/24/2019   ALT 10 12/24/2019   PROT 5.4 (L) 12/24/2019   ALBUMIN 3.9 12/24/2019   CALCIUM 8.6 12/24/2019   ANIONGAP 6 09/22/2018   Lab Results  Component Value Date   CHOL 96 (L) 12/24/2019   Lab Results  Component Value Date   HDL 28 (L) 12/24/2019   Lab Results  Component Value Date   LDLCALC 50 12/24/2019   Lab Results  Component Value Date   TRIG 92 12/24/2019   Lab Results  Component Value Date   CHOLHDL 3.4 12/24/2019   No results found for: HGBA1C    Assessment & Plan:  1. Mixed hyperlipidemia Well controlled.  No changes to medicines.  Continue to work on eating a healthy diet and exercise.  Labs drawn today.  - Lipid panel; Future - Comprehensive metabolic panel; Future  2. Acquired hypothyroidism The current medical regimen is effective;  continue present plan and medications. - TSH; Future  3. CLL (chronic lymphocytic leukemia) (HCC) Causing a pancytopenia. Follows with Dr. Lissa Merlin - CBC with Differential/Platelet; Future - CBC with Differential/Platelet; Future  4. Sick sinus syndrome ([place]) Managed by Dr. Curt Bears.   5. Thrombocytopenia, secondary Platelets in 40s. I personally have not seen them this low. I will repeat cbc.  6. Chronic low back pain with sciatica, sciatica laterality unspecified, unspecified back pain laterality The current medical regimen is effective;  continue present plan and medications. Fair control of pain. 7. Uncomplicated opioid dependence ([place]) See above. 8. Vasculitis determined by biopsy of skin ([place]) Flares intermittently Managed by dermatology.   Follow-up: No follow-ups on file.    Rochel Brome, MD

## 2019-12-27 ENCOUNTER — Encounter: Payer: Self-pay | Admitting: Family Medicine

## 2019-12-27 ENCOUNTER — Telehealth: Payer: Self-pay | Admitting: Family Medicine

## 2019-12-27 ENCOUNTER — Other Ambulatory Visit: Payer: Self-pay

## 2019-12-27 ENCOUNTER — Ambulatory Visit (INDEPENDENT_AMBULATORY_CARE_PROVIDER_SITE_OTHER): Payer: Medicare Other | Admitting: Family Medicine

## 2019-12-27 VITALS — BP 110/64 | HR 45 | Temp 97.3°F | Ht 70.0 in | Wt 184.0 lb

## 2019-12-27 DIAGNOSIS — D6959 Other secondary thrombocytopenia: Secondary | ICD-10-CM | POA: Diagnosis not present

## 2019-12-27 DIAGNOSIS — E039 Hypothyroidism, unspecified: Secondary | ICD-10-CM

## 2019-12-27 DIAGNOSIS — F1120 Opioid dependence, uncomplicated: Secondary | ICD-10-CM

## 2019-12-27 DIAGNOSIS — E782 Mixed hyperlipidemia: Secondary | ICD-10-CM

## 2019-12-27 DIAGNOSIS — M5440 Lumbago with sciatica, unspecified side: Secondary | ICD-10-CM

## 2019-12-27 DIAGNOSIS — C9110 Chronic lymphocytic leukemia of B-cell type not having achieved remission: Secondary | ICD-10-CM

## 2019-12-27 DIAGNOSIS — G8929 Other chronic pain: Secondary | ICD-10-CM

## 2019-12-27 DIAGNOSIS — I776 Arteritis, unspecified: Secondary | ICD-10-CM

## 2019-12-27 DIAGNOSIS — L959 Vasculitis limited to the skin, unspecified: Secondary | ICD-10-CM

## 2019-12-27 DIAGNOSIS — I495 Sick sinus syndrome: Secondary | ICD-10-CM

## 2019-12-27 NOTE — Patient Instructions (Signed)
Medical issues fairly well controlled.  Recheck blood count in 1 week.  The current medical regimen is effective;  continue present plan and medications. Refill synthroid.

## 2019-12-27 NOTE — Progress Notes (Signed)
  Chronic Care Management   Outreach Note  12/27/2019 Name: Jesse [name] MRN: [id number] DOB: [birth year]  Referred by: Rochel Brome, MD Reason for referral : No chief complaint on file.   An unsuccessful telephone outreach was attempted today. The patient was referred to the pharmacist for assistance with care management and care coordination.   Follow Up Plan:   SIGNATURE

## 2020-01-02 ENCOUNTER — Other Ambulatory Visit: Payer: Self-pay

## 2020-01-02 ENCOUNTER — Other Ambulatory Visit: Payer: Medicare Other

## 2020-01-02 DIAGNOSIS — C9110 Chronic lymphocytic leukemia of B-cell type not having achieved remission: Secondary | ICD-10-CM

## 2020-01-02 DIAGNOSIS — E782 Mixed hyperlipidemia: Secondary | ICD-10-CM

## 2020-01-02 DIAGNOSIS — E039 Hypothyroidism, unspecified: Secondary | ICD-10-CM | POA: Diagnosis not present

## 2020-01-03 ENCOUNTER — Ambulatory Visit: Payer: Medicare Other | Admitting: Cardiology

## 2020-01-03 ENCOUNTER — Encounter: Payer: Self-pay | Admitting: Cardiology

## 2020-01-03 VITALS — BP 122/72 | HR 51 | Ht 70.0 in | Wt 185.0 lb

## 2020-01-03 DIAGNOSIS — E785 Hyperlipidemia, unspecified: Secondary | ICD-10-CM

## 2020-01-03 DIAGNOSIS — I493 Ventricular premature depolarization: Secondary | ICD-10-CM | POA: Diagnosis not present

## 2020-01-03 DIAGNOSIS — I5032 Chronic diastolic (congestive) heart failure: Secondary | ICD-10-CM

## 2020-01-03 DIAGNOSIS — I25118 Atherosclerotic heart disease of native coronary artery with other forms of angina pectoris: Secondary | ICD-10-CM | POA: Diagnosis not present

## 2020-01-03 DIAGNOSIS — I110 Hypertensive heart disease with heart failure: Secondary | ICD-10-CM | POA: Diagnosis not present

## 2020-01-03 DIAGNOSIS — Z79899 Other long term (current) drug therapy: Secondary | ICD-10-CM

## 2020-01-03 LAB — CBC WITH DIFFERENTIAL/PLATELET
Basophils Absolute: 0 10*3/uL (ref 0.0–0.2)
Basophils Absolute: 0 10*3/uL (ref 0.0–0.2)
Basos: 1 %
Basos: 1 %
EOS (ABSOLUTE): 0.2 10*3/uL (ref 0.0–0.4)
EOS (ABSOLUTE): 0.2 10*3/uL (ref 0.0–0.4)
Eos: 6 %
Eos: 6 %
Hematocrit: 34.3 % — ABNORMAL LOW (ref 37.5–51.0)
Hematocrit: 35.8 % — ABNORMAL LOW (ref 37.5–51.0)
Hemoglobin: 11.7 g/dL — ABNORMAL LOW (ref 13.0–17.7)
Hemoglobin: 11.9 g/dL — ABNORMAL LOW (ref 13.0–17.7)
Immature Grans (Abs): 0 10*3/uL (ref 0.0–0.1)
Immature Grans (Abs): 0 10*3/uL (ref 0.0–0.1)
Immature Granulocytes: 1 %
Immature Granulocytes: 1 %
Lymphocytes Absolute: 0.9 10*3/uL (ref 0.7–3.1)
Lymphocytes Absolute: 0.9 10*3/uL (ref 0.7–3.1)
Lymphs: 27 %
Lymphs: 27 %
MCH: 31.2 pg (ref 26.6–33.0)
MCH: 30.6 pg (ref 26.6–33.0)
MCHC: 34.1 g/dL (ref 31.5–35.7)
MCHC: 33.2 g/dL (ref 31.5–35.7)
MCV: 92 fL (ref 79–97)
MCV: 92 fL (ref 79–97)
Monocytes Absolute: 0.3 10*3/uL (ref 0.1–0.9)
Monocytes Absolute: 0.3 10*3/uL (ref 0.1–0.9)
Monocytes: 8 %
Monocytes: 7 %
Neutrophils Absolute: 2.1 10*3/uL (ref 1.4–7.0)
Neutrophils Absolute: 2.1 10*3/uL (ref 1.4–7.0)
Neutrophils: 57 %
Neutrophils: 58 %
Platelets: 43 10*3/uL — CL (ref 150–450)
Platelets: 45 10*3/uL — CL (ref 150–450)
RBC: 3.75 x10E6/uL — ABNORMAL LOW (ref 4.14–5.80)
RBC: 3.89 x10E6/uL — ABNORMAL LOW (ref 4.14–5.80)
RDW: 14.1 % (ref 11.6–15.4)
RDW: 14.2 % (ref 11.6–15.4)
WBC: 3.5 10*3/uL (ref 3.4–10.8)
WBC: 3.5 10*3/uL (ref 3.4–10.8)

## 2020-01-03 LAB — COMPREHENSIVE METABOLIC PANEL
ALT: 13 IU/L (ref 0–44)
AST: 31 IU/L (ref 0–40)
Albumin/Globulin Ratio: 2.3 — ABNORMAL HIGH (ref 1.2–2.2)
Albumin: 3.7 g/dL (ref 3.7–4.7)
Alkaline Phosphatase: 95 IU/L (ref 39–117)
BUN/Creatinine Ratio: 14 (ref 10–24)
BUN: 15 mg/dL (ref 8–27)
Bilirubin Total: 0.6 mg/dL (ref 0.0–1.2)
CO2: 28 mmol/L (ref 20–29)
Calcium: 8.3 mg/dL — ABNORMAL LOW (ref 8.6–10.2)
Chloride: 104 mmol/L (ref 96–106)
Creatinine, Ser: 1.11 mg/dL (ref 0.76–1.27)
GFR calc Af Amer: 72 mL/min/{1.73_m2} (ref >59)
GFR calc non Af Amer: 62 mL/min/{1.73_m2} (ref >59)
Globulin, Total: 1.6 g/dL (ref 1.5–4.5)
Glucose: 91 mg/dL (ref 65–99)
Potassium: 4.3 mmol/L (ref 3.5–5.2)
Sodium: 142 mmol/L (ref 134–144)
Total Protein: 5.3 g/dL — ABNORMAL LOW (ref 6.0–8.5)

## 2020-01-03 LAB — LIPID PANEL
Chol/HDL Ratio: 4.4 ratio (ref 0.0–5.0)
Cholesterol, Total: 123 mg/dL (ref 100–199)
HDL: 28 mg/dL — ABNORMAL LOW (ref >39)
LDL Chol Calc (NIH): 75 mg/dL (ref 0–99)
Triglycerides: 106 mg/dL (ref 0–149)
VLDL Cholesterol Cal: 20 mg/dL (ref 5–40)

## 2020-01-03 LAB — TSH: TSH: 6.79 u[IU]/mL — ABNORMAL HIGH (ref 0.450–4.500)

## 2020-01-03 LAB — CARDIOVASCULAR RISK ASSESSMENT

## 2020-01-03 MED ORDER — SOTALOL HCL 80 MG PO TABS: 80.0000 mg | ORAL_TABLET | Freq: Two times a day (BID) | ORAL | 1 refills | Status: DC

## 2020-01-03 NOTE — Patient Instructions (Addendum)
Medication Instructions:  Your physician has recommended you make the following change in your medication:  INCREASE: Sotalol to 80 mg take one tablet by mouth twice daily. *If you need a refill on your cardiac medications before your next appointment, please call your pharmacy*   Lab Work: None If you have labs (blood work) drawn today and your tests are completely normal, you will receive your results only by: Marland Kitchen MyChart Message (if you have MyChart) OR . A paper copy in the mail If you have any lab test that is abnormal or we need to change your treatment, we will call you to review the results.   Testing/Procedures: None   Follow-Up: At [facility], you and your health needs are our priority.  As part of our continuing mission to provide you with exceptional heart care, we have created designated Provider Care Teams.  These Care Teams include your primary Cardiologist (physician) and Advanced Practice Providers (APPs -  Physician Assistants and Nurse Practitioners) who all work together to provide you with the care you need, when you need it.  We recommend signing up for the patient portal called "MyChart".  Sign up information is provided on this After Visit Summary.  MyChart is used to connect with patients for Virtual Visits (Telemedicine).  Patients are able to view lab/test results, encounter notes, upcoming appointments, etc.  Non-urgent messages can be sent to your provider as well.   To learn more about what you can do with MyChart, go to [email or web address].    Your next appointment:   3 month(s)  The format for your next appointment:   In Person  Provider:   Shirlee More, MD   Other Instructions

## 2020-01-03 NOTE — Progress Notes (Signed)
Cardiology Office Note:    Date:  01/03/2020   ID:  Jesse [name], DOB [birth year], MRN [id number]  PCP:  Jesse Brome, MD  Cardiologist:  Jesse More, MD    Referring MD: Jesse Brome, MD    ASSESSMENT:    1. PVC's (premature ventricular contractions)   2. High risk medication use   3. Coronary artery disease of native artery of native heart with stable angina pectoris ([place])   4. Hypertensive heart disease with heart failure ([place])   5. Chronic diastolic heart failure ([place])   6. Hyperlipidemia, unspecified hyperlipidemia type    PLAN:    In order of problems listed above:  1. Stable on the way to 4 weeks then uptitrate 80 mg twice daily follow-up with the EP. 2. Continue sotalol normal QT interval 3. Stable CAD continue medical therapy 4. BP at target continue current treatment 5. Compensated continue his loop diuretic 6. Lipids at target for him continue with statin   Next appointment: 3 months with me   Medication Adjustments/Labs and Tests Ordered: Current medicines are reviewed at length with the patient today.  Concerns regarding medicines are outlined above.  No orders of the defined types were placed in this encounter.  No orders of the defined types were placed in this encounter.   Chief Complaint  Patient presents with  . Follow-up    History of Present Illness:    Jesse [name] is a 79 y.o. male with a hx of mild aortic stenosis, diastolic heart failure and frequent PVCs last seen 07/23/2020.  He was referred to EP but his arrhythmia could not be induced and he had no intervention performed.  He felt poorly on an amiodarone was discontinued and recently initiated on sotalol to have an EKG today and if reassuring to increase him to 80 mg twice daily. Compliance with diet, lifestyle and medications: Yes  He has tolerated sotalol is taken for less than a week now without difficulty.  I have him continue his current dose of 3 to 4 weeks and increase to 80 mg  twice daily his EKG today shows normal QT interval and ventricular bigeminy.  His pulse at home is running in the range of 55 to 60 bpm he is not having symptomatic palpitations.  He has no edema orthopnea chest pain palpitation or syncope overall is pleased with the quality of his life as well as had some rash on his leg which makes him concerned with background history of vasculitis. Past Medical History:  Diagnosis Date  . Anemia    takes Ferrous Sulfate daily  . Anxiety    takes Xanax daily  . Arthritis   . Atherosclerotic heart disease of native coronary artery without angina pectoris   . BPH (benign prostatic hyperplasia) 10/25/2017  . Bradycardia 10/25/2017  . CAD (coronary artery disease) 10/25/2017  . Chest pain 10/25/2017  . Chronic back pain   . Chronic ITP (idiopathic thrombocytopenia) (HCC) 08/08/2013  . Chronic lymphocytic leukemia ([place]) 10/25/2017  . Chronic lymphocytic leukemia of B-cell type not having achieved remission ([place])   . Chronic pain syndrome   . Constipation    takes Colace daily as needed  . Dry eyes    eye drops as needed  . Dyspnea on exertion 10/25/2017  . Essential (primary) hypertension   . History of blood transfusion    no abnormal reaction noted  . History of kidney stones   . Hyperlipemia 10/25/2017  . Hyperlipidemia    takes Pravastatin daily  .  Hypertensive heart disease without heart failure   . Infusion reaction 4 yrs ago   platelet infusion broke out head to toe  . Insomnia    takes trazodone nightly as needed  . Leukemia ([place])   . Low back pain 12/12/2012  . Lumbar radiculopathy 01/09/2013  . Major depressive disorder, single episode, mild ([place])   . Muscle spasm    takes Robaxin daily as needed  . Nerve pain    takes Gabapentin daily   . Nocturia   . Opioid dependence, uncomplicated ([place])   . Other primary thrombocytopenia ([place])   . Other specified hypothyroidism   . Other specified intestinal infections   . Renal stones 10/25/2017  . Status  post lumbar surgery 12/02/2015  . Thrombocytopenia, secondary 10/25/2017   Overview:  secondary to CLL  . Vitamin B12 deficiency    takes Vit B12 daily  . Weakness    numbness and tingling    Past Surgical History:  Procedure Laterality Date  . ANTERIOR LAT LUMBAR FUSION N/A 12/02/2015   Procedure: L3-4 ANTERIOR LATERAL LUMBAR FUSION w/lateral plate;  Surgeon: Jesse Moore, MD;  Location: [facility];  Service: Neurosurgery;  Laterality: N/A;  L3-4 ANTERIOR LATERAL LUMBAR FUSION w/lateral plate  . BACK SURGERY  2012   fusion x 2  . CATARACT EXTRACTION    . CERVICAL FUSION  1988   x 2  . COLONOSCOPY    . FOOT SURGERY    . HERNIA REPAIR Left    inguinal  . KNEE SURGERY    . LAMINECTOMY    . plates/screws/rods removed  2015  . PVC ABLATION N/A 04/24/2019   Procedure: PVC ABLATION;  Surgeon: Jesse Haw, MD;  Location: [facility];  Service: Cardiovascular;  Laterality: N/A;  . TOE SURGERY    . TRANSURETHRAL RESECTION OF PROSTATE      Current Medications: Current Meds  Medication Sig  . ALPRAZolam (XANAX) 0.5 MG tablet Take 0.5 mg by mouth 2 (two) times daily.  . furosemide (LASIX) 20 MG tablet Take 20 mg by mouth daily as needed (feet swelling).  . gabapentin (NEURONTIN) 600 MG tablet TAKE 1 TABLET BY MOUTH 3  TIMES DAILY  . levothyroxine (SYNTHROID) 100 MCG tablet Take 100 mcg by mouth daily before breakfast.  . methocarbamol (ROBAXIN) 750 MG tablet Take 1 tablet (750 mg total) by mouth 4 (four) times daily.  Marland Kitchen oxyCODONE-acetaminophen (PERCOCET) 10-325 MG tablet Take 1.5 tablets by mouth every 6 (six) hours as needed for up to 150 doses for pain (back pain).  . pravastatin (PRAVACHOL) 80 MG tablet Take 1 tablet (80 mg total) by mouth every evening.  . sotalol (BETAPACE) 80 MG tablet Take 0.5 tablets (40 mg total) by mouth 2 (two) times daily.  . Tetrahydrozoline-PEG (EYE DROPS EXTRA OP) Apply 1-2 drops to eye as needed.  . traZODone (DESYREL) 50 MG tablet Take  50-100 mg by mouth at bedtime as needed for sleep.      Allergies:   Flecainide, Morphine and related, Niaspan [niacin], Propafenone, and Zoloft [sertraline]   Social History   Socioeconomic History  . Marital status: Married    Spouse name: Not on file  . Number of children: 1  . Years of education: Not on file  . Highest education level: Not on file  Occupational History  . Not on file  Tobacco Use  . Smoking status: Never Smoker  . Smokeless tobacco: Never Used  Substance and Sexual Activity  .  Alcohol use: No  . Drug use: No  . Sexual activity: Not Currently  Other Topics Concern  . Not on file  Social History Narrative  . Not on file   Social Determinants of Health   Financial Resource Strain:   . Difficulty of Paying Living Expenses:   Food Insecurity:   . Worried About Charity fundraiser in the Last Year:   . Arboriculturist in the Last Year:   Transportation Needs:   . Film/video editor (Medical):   Marland Kitchen Lack of Transportation (Non-Medical):   Physical Activity:   . Days of Exercise per Week:   . Minutes of Exercise per Session:   Stress:   . Feeling of Stress :   Social Connections:   . Frequency of Communication with Friends and Family:   . Frequency of Social Gatherings with Friends and Family:   . Attends Religious Services:   . Active Member of Clubs or Organizations:   . Attends Archivist Meetings:   Marland Kitchen Marital Status:      Family History: The patient's family history includes CAD in his brother and father; Congestive Heart Failure in his brother; Kidney Stones in his brother; Kidney failure in his brother; Leukemia in his brother; Prostate cancer in his brother; Throat cancer in his mother. ROS:   Please see the history of present illness.    All other systems reviewed and are negative.  EKGs/Labs/Other Studies Reviewed:    The following studies were reviewed today:  EKG:  EKG ordered today and personally reviewed.  The ekg  ordered today demonstrates sinus rhythm normal QT interval left axis deviation ventricular bigeminy  Recent Labs: Renal function preserved potassium normal lipids at target 01/02/2020: ALT 13; BUN 15; Creatinine, Ser 1.11; Hemoglobin 11.7; Platelets 43; Potassium 4.3; Sodium 142; TSH 6.790  Recent Lipid Panel    Component Value Date/Time   CHOL 123 01/02/2020 0852   TRIG 106 01/02/2020 0852   HDL 28 (L) 01/02/2020 0852   CHOLHDL 4.4 01/02/2020 0852   LDLCALC 75 01/02/2020 0852    Physical Exam:    VS:  BP 122/72   Pulse (!) 51   Ht 5\' 10"  (1.778 m)   Wt 185 lb (83.9 kg)   SpO2 96%   BMI 26.54 kg/m     Wt Readings from Last 3 Encounters:  01/03/20 185 lb (83.9 kg)  12/27/19 184 lb (83.5 kg)  11/25/19 189 lb (85.7 kg)     GEN:  Well nourished, well developed in no acute distress HEENT: Normal NECK: No JVD; No carotid bruits LYMPHATICS: No lymphadenopathy CARDIAC: RRR, no murmurs, rubs, gallops RESPIRATORY:  Clear to auscultation without rales, wheezing or rhonchi  ABDOMEN: Soft, non-tender, non-distended MUSCULOSKELETAL:  No edema; No deformity  SKIN: Warm and dry NEUROLOGIC:  Alert and oriented x 3 PSYCHIATRIC:  Normal affect    Signed, Jesse More, MD  01/03/2020 11:39 AM    [place]

## 2020-01-07 ENCOUNTER — Encounter: Payer: Self-pay | Admitting: Family Medicine

## 2020-01-07 DIAGNOSIS — E039 Hypothyroidism, unspecified: Secondary | ICD-10-CM | POA: Insufficient documentation

## 2020-01-07 DIAGNOSIS — I495 Sick sinus syndrome: Secondary | ICD-10-CM | POA: Insufficient documentation

## 2020-01-07 DIAGNOSIS — F1120 Opioid dependence, uncomplicated: Secondary | ICD-10-CM | POA: Insufficient documentation

## 2020-01-09 ENCOUNTER — Other Ambulatory Visit: Payer: Self-pay

## 2020-01-09 MED ORDER — OXYCODONE-ACETAMINOPHEN 10-325 MG PO TABS: 1.4000 | ORAL_TABLET | Freq: Four times a day (QID) | ORAL | 0 refills | Status: DC | PRN

## 2020-01-09 MED ORDER — LEVOTHYROXINE SODIUM 100 MCG PO TABS: 100.0000 ug | ORAL_TABLET | Freq: Every day | ORAL | 2 refills | Status: DC

## 2020-01-09 MED ORDER — ALPRAZOLAM 0.5 MG PO TABS: 0.5000 mg | ORAL_TABLET | Freq: Two times a day (BID) | ORAL | 0 refills | Status: DC

## 2020-01-10 ENCOUNTER — Telehealth: Payer: Self-pay | Admitting: Family Medicine

## 2020-01-10 NOTE — Progress Notes (Signed)
  Chronic Care Management   Note  01/10/2020 Name: Jesse [name] MRN: [id number] DOB: 1940-12-06  [name] is a 79 y.o. year old male who is a primary care patient of Cox, Kirsten, MD. I reached out to [name] by phone today in response to a referral sent by [name]'s PCP, Cox, Kirsten, MD.   Mr. Mask was given information about Chronic Care Management services today including:  1. CCM service includes personalized support from designated clinical staff supervised by his physician, including individualized plan of care and coordination with other care providers 2. 24/7 contact phone numbers for assistance for urgent and routine care needs. 3. Service will only be billed when office clinical staff spend 20 minutes or more in a month to coordinate care. 4. Only one practitioner may furnish and bill the service in a calendar month. 5. The patient may stop CCM services at any time (effective at the end of the month) by phone call to the office staff.   Patient did not agree to services and wishes to consider information provided before deciding about enrollment in care management services.   This note is not being shared with the patient for the following reason: To respect privacy (The patient or proxy has requested that the information not be shared).  Follow up plan:   [name] Upstream Scheduler

## 2020-01-15 ENCOUNTER — Other Ambulatory Visit: Payer: Self-pay

## 2020-01-15 MED ORDER — LEVOTHYROXINE SODIUM 100 MCG PO TABS: 100.0000 ug | ORAL_TABLET | Freq: Every day | ORAL | 2 refills | Status: DC

## 2020-01-22 ENCOUNTER — Telehealth: Payer: Self-pay

## 2020-01-22 NOTE — Telephone Encounter (Signed)
Spoke to the patient just now and got him set up for an appointment on 01/28/20 to be seen by Dr. Harriet Masson and to have an EKG done. Patient verbalizes understanding and does not have any other issues or concerns at this time.    Encouraged patient to call back with any questions or concerns.

## 2020-01-22 NOTE — Telephone Encounter (Signed)
-----   Message from Stanton Kidney, RN sent at 01/22/2020 10:23 AM EDT ----- Regarding: EKG Sotalol started on 3/15 by Camnitz. F/u w/ Munley on 4/16, who titrated it up to 80 (per our request) mg.  Pt needs a follow up EKG post Sotalol increase.  (usally w/I 7-10 days after increasing). Will forward to [place] office to arrange f/u EKG.

## 2020-01-27 ENCOUNTER — Other Ambulatory Visit: Payer: Self-pay

## 2020-01-28 ENCOUNTER — Ambulatory Visit: Payer: Medicare Other | Admitting: Cardiology

## 2020-01-28 ENCOUNTER — Encounter: Payer: Self-pay | Admitting: Cardiology

## 2020-01-28 ENCOUNTER — Other Ambulatory Visit: Payer: Self-pay

## 2020-01-28 VITALS — BP 104/68 | HR 53 | Ht 70.0 in | Wt 186.0 lb

## 2020-01-28 DIAGNOSIS — E782 Mixed hyperlipidemia: Secondary | ICD-10-CM

## 2020-01-28 DIAGNOSIS — I350 Nonrheumatic aortic (valve) stenosis: Secondary | ICD-10-CM

## 2020-01-28 DIAGNOSIS — I25118 Atherosclerotic heart disease of native coronary artery with other forms of angina pectoris: Secondary | ICD-10-CM

## 2020-01-28 DIAGNOSIS — I493 Ventricular premature depolarization: Secondary | ICD-10-CM | POA: Diagnosis not present

## 2020-01-28 NOTE — Patient Instructions (Signed)

## 2020-01-28 NOTE — Progress Notes (Signed)
Cardiology Office Note:    Date:  01/28/2020   ID:  Jesse [name], DOB 06/20/1941, MRN [id number]  PCP:  Rochel Brome, MD  Cardiologist:  No primary care provider on file.  Electrophysiologist:  Will Meredith Leeds, MD   Referring MD: Rochel Brome, MD   Chief Complaint  Patient presents with  . Follow-up    Medication    History of Present Illness:    Jesse [name] is a 79 y.o. male with a hx of dilated stenosis, diastolic heart failure, frequent PVCs were last seen by Dr. Bettina Gavia on January 03, 2020.  At that time they discuss increasing his sotalol dosage to 80 mg at his 2-week visit once his EKG stable.  The patient tells me that he does not want to increase his sotalol given the last time he was put on this dose he felt significantly dizzy.  And he is concerned about this fact.  Past Medical History:  Diagnosis Date  . Anemia    takes Ferrous Sulfate daily  . Anxiety    takes Xanax daily  . Arthritis   . Atherosclerotic heart disease of native coronary artery without angina pectoris   . BPH (benign prostatic hyperplasia) 10/25/2017  . Bradycardia 10/25/2017  . CAD (coronary artery disease) 10/25/2017  . Chest pain 10/25/2017  . Chronic back pain   . Chronic ITP (idiopathic thrombocytopenia) (HCC) 08/08/2013  . Chronic lymphocytic leukemia ([place]) 10/25/2017  . Chronic lymphocytic leukemia of B-cell type not having achieved remission ([place])   . Chronic pain syndrome   . Constipation    takes Colace daily as needed  . Dry eyes    eye drops as needed  . Dyspnea on exertion 10/25/2017  . Essential (primary) hypertension   . History of blood transfusion    no abnormal reaction noted  . History of kidney stones   . Hyperlipemia 10/25/2017  . Hyperlipidemia    takes Pravastatin daily  . Hypertensive heart disease without heart failure   . Infusion reaction 4 yrs ago   platelet infusion broke out head to toe  . Insomnia    takes trazodone nightly as needed  . Leukemia ([place])   . Low  back pain 12/12/2012  . Lumbar radiculopathy 01/09/2013  . Major depressive disorder, single episode, mild ([place])   . Muscle spasm    takes Robaxin daily as needed  . Nerve pain    takes Gabapentin daily   . Nocturia   . Opioid dependence, uncomplicated ([place])   . Other primary thrombocytopenia ([place])   . Other specified hypothyroidism   . Other specified intestinal infections   . Renal stones 10/25/2017  . Status post lumbar surgery 12/02/2015  . Thrombocytopenia, secondary 10/25/2017   Overview:  secondary to CLL  . Vitamin B12 deficiency    takes Vit B12 daily  . Weakness    numbness and tingling    Past Surgical History:  Procedure Laterality Date  . ANTERIOR LAT LUMBAR FUSION N/A 12/02/2015   Procedure: L3-4 ANTERIOR LATERAL LUMBAR FUSION w/lateral plate;  Surgeon: Eustace Moore, MD;  Location: [facility];  Service: Neurosurgery;  Laterality: N/A;  L3-4 ANTERIOR LATERAL LUMBAR FUSION w/lateral plate  . BACK SURGERY  2012   fusion x 2  . CATARACT EXTRACTION    . CERVICAL FUSION  1988   x 2  . COLONOSCOPY    . FOOT SURGERY    . HERNIA REPAIR Left    inguinal  . KNEE SURGERY    .  LAMINECTOMY    . plates/screws/rods removed  2015  . PVC ABLATION N/A 04/24/2019   Procedure: PVC ABLATION;  Surgeon: Constance Haw, MD;  Location: [facility];  Service: Cardiovascular;  Laterality: N/A;  . TOE SURGERY    . TRANSURETHRAL RESECTION OF PROSTATE      Current Medications: Current Meds  Medication Sig  . ALPRAZolam (XANAX) 0.5 MG tablet Take 1 tablet (0.5 mg total) by mouth 2 (two) times daily.  . folic acid (FOLVITE) 1 MG tablet Take 1 mg by mouth daily.  . furosemide (LASIX) 20 MG tablet Take 20 mg by mouth daily as needed (feet swelling).  . gabapentin (NEURONTIN) 600 MG tablet TAKE 1 TABLET BY MOUTH 3  TIMES DAILY  . levothyroxine (SYNTHROID) 100 MCG tablet Take 1 tablet (100 mcg total) by mouth daily before breakfast.  . methocarbamol (ROBAXIN) 750 MG tablet Take 1  tablet (750 mg total) by mouth 4 (four) times daily.  Marland Kitchen oxyCODONE-acetaminophen (PERCOCET) 10-325 MG tablet Take 1.5 tablets by mouth every 6 (six) hours as needed for up to 150 doses for pain (back pain).  . pravastatin (PRAVACHOL) 80 MG tablet Take 1 tablet (80 mg total) by mouth every evening.  . sotalol (BETAPACE) 80 MG tablet Take 40 mg by mouth 2 (two) times daily.  . Tetrahydrozoline-PEG (EYE DROPS EXTRA OP) Apply 1-2 drops to eye as needed.  . traZODone (DESYREL) 50 MG tablet Take 50-100 mg by mouth at bedtime as needed for sleep.      Allergies:   Flecainide, Morphine and related, Niaspan [niacin], Propafenone, and Zoloft [sertraline]   Social History   Socioeconomic History  . Marital status: Married    Spouse name: Not on file  . Number of children: 1  . Years of education: Not on file  . Highest education level: Not on file  Occupational History  . Not on file  Tobacco Use  . Smoking status: Never Smoker  . Smokeless tobacco: Never Used  Substance and Sexual Activity  . Alcohol use: No  . Drug use: No  . Sexual activity: Not Currently  Other Topics Concern  . Not on file  Social History Narrative  . Not on file   Social Determinants of Health   Financial Resource Strain:   . Difficulty of Paying Living Expenses:   Food Insecurity:   . Worried About Charity fundraiser in the Last Year:   . Arboriculturist in the Last Year:   Transportation Needs:   . Film/video editor (Medical):   Marland Kitchen Lack of Transportation (Non-Medical):   Physical Activity:   . Days of Exercise per Week:   . Minutes of Exercise per Session:   Stress:   . Feeling of Stress :   Social Connections:   . Frequency of Communication with Friends and Family:   . Frequency of Social Gatherings with Friends and Family:   . Attends Religious Services:   . Active Member of Clubs or Organizations:   . Attends Archivist Meetings:   Marland Kitchen Marital Status:      Family History: The  patient's family history includes CAD in his brother and father; Congestive Heart Failure in his brother; Kidney Stones in his brother; Kidney failure in his brother; Leukemia in his brother; Prostate cancer in his brother; Throat cancer in his mother.  ROS:   Review of Systems  Constitution: Negative for decreased appetite, fever and weight gain.  HENT: Negative for congestion,  ear discharge, hoarse voice and sore throat.   Eyes: Negative for discharge, redness, vision loss in right eye and visual halos.  Cardiovascular: Negative for chest pain, dyspnea on exertion, leg swelling, orthopnea and palpitations.  Respiratory: Negative for cough, hemoptysis, shortness of breath and snoring.   Endocrine: Negative for heat intolerance and polyphagia.  Hematologic/Lymphatic: Negative for bleeding problem. Does not bruise/bleed easily.  Skin: Negative for flushing, nail changes, rash and suspicious lesions.  Musculoskeletal: Negative for arthritis, joint pain, muscle cramps, myalgias, neck pain and stiffness.  Gastrointestinal: Negative for abdominal pain, bowel incontinence, diarrhea and excessive appetite.  Genitourinary: Negative for decreased libido, genital sores and incomplete emptying.  Neurological: Negative for brief paralysis, focal weakness, headaches and loss of balance.  Psychiatric/Behavioral: Negative for altered mental status, depression and suicidal ideas.  Allergic/Immunologic: Negative for HIV exposure and persistent infections.    EKGs/Labs/Other Studies Reviewed:    The following studies were reviewed today:   EKG:  The ekg ordered today demonstrates sinus bradycardia, heart rate 53 bpm with frequent PVCs and bigeminy compared to EKG done on January 03, 2020 there is no significant change.  QTc 465 ms which was 464 times as well.  Recent Labs: 01/02/2020: ALT 13; BUN 15; Creatinine, Ser 1.11; Hemoglobin 11.7; Platelets 43; Potassium 4.3; Sodium 142; TSH 6.790  Recent Lipid  Panel    Component Value Date/Time   CHOL 123 01/02/2020 0852   TRIG 106 01/02/2020 0852   HDL 28 (L) 01/02/2020 0852   CHOLHDL 4.4 01/02/2020 0852   LDLCALC 75 01/02/2020 0852    Physical Exam:    VS:  BP 104/68 (BP Location: Right Arm, Patient Position: Sitting, Cuff Size: Normal)   Pulse (!) 53   Ht 5\' 10"  (1.778 m)   Wt 186 lb (84.4 kg)   SpO2 98%   BMI 26.69 kg/m     Wt Readings from Last 3 Encounters:  01/28/20 186 lb (84.4 kg)  01/03/20 185 lb (83.9 kg)  12/27/19 184 lb (83.5 kg)     GEN: Well nourished, well developed in no acute distress HEENT: Normal NECK: No JVD; No carotid bruits LYMPHATICS: No lymphadenopathy CARDIAC: S1S2 noted,RRR, no murmurs, rubs, gallops RESPIRATORY:  Clear to auscultation without rales, wheezing or rhonchi  ABDOMEN: Soft, non-tender, non-distended, +bowel sounds, no guarding. EXTREMITIES: +1 bilateral leg edema, No cyanosis, no clubbing MUSCULOSKELETAL:  No deformity  SKIN: Warm and dry NEUROLOGIC:  Alert and oriented x 3, non-focal PSYCHIATRIC:  Normal affect, good insight  ASSESSMENT:    1. Coronary artery disease of native artery of native heart with stable angina pectoris ([place])   2. Mild aortic stenosis   3. PVC's (premature ventricular contractions)   4. Mixed hyperlipidemia    PLAN:     1.  His EKG which appeared to be stable compared to the EKG done January 03, 2020, QTC is 465 ms he still does have frequent PVCs noted on his EKG.  Discussed increasing the sotalol 80 mg twice a day but at this time the patient has deferred this.  He denies any symptoms of palpitations or fatigue shortness of breath.  2. based on  physical exam does suggest bilateral leg edema-in discussion the patient reports that he is not taking his Lasix every day advised the patient to please take his 20 mg Lasix in the a.m. we can reassess at his follow-up visit.  3.  Hyperlipidemia-continue patient his current statin medication.  The patient is in  agreement with the above  plan. The patient left the office in stable condition.  The patient will follow up in 1 month with Dr. Bettina Gavia   Medication Adjustments/Labs and Tests Ordered: Current medicines are reviewed at length with the patient today.  Concerns regarding medicines are outlined above.  Orders Placed This Encounter  Procedures  . EKG 12-Lead   No orders of the defined types were placed in this encounter.   Patient Instructions  Medication Instructions:  Your physician recommends that you continue on your current medications as directed. Please refer to the Current Medication list given to you today.  *If you need a refill on your cardiac medications before your next appointment, please call your pharmacy*   Lab Work: None If you have labs (blood work) drawn today and your tests are completely normal, you will receive your results only by: Marland Kitchen MyChart Message (if you have MyChart) OR . A paper copy in the mail If you have any lab test that is abnormal or we need to change your treatment, we will call you to review the results.   Testing/Procedures: None   Follow-Up: At [facility], you and your health needs are our priority.  As part of our continuing mission to provide you with exceptional heart care, we have created designated Provider Care Teams.  These Care Teams include your primary Cardiologist (physician) and Advanced Practice Providers (APPs -  Physician Assistants and Nurse Practitioners) who all work together to provide you with the care you need, when you need it.  We recommend signing up for the patient portal called "MyChart".  Sign up information is provided on this After Visit Summary.  MyChart is used to connect with patients for Virtual Visits (Telemedicine).  Patients are able to view lab/test results, encounter notes, upcoming appointments, etc.  Non-urgent messages can be sent to your provider as well.   To learn more about what you can do with  MyChart, go to [email or web address].    Your next appointment:   1 month(s)  The format for your next appointment:   In Person  Provider:   Shirlee More, MD   Other Instructions      Adopting a Healthy Lifestyle.  Know what a healthy weight is for you (roughly BMI <25) and aim to maintain this   Aim for 7+ servings of fruits and vegetables daily   65-80+ fluid ounces of water or unsweet tea for healthy kidneys   Limit to max 1 drink of alcohol per day; avoid smoking/tobacco   Limit animal fats in diet for cholesterol and heart health - choose grass fed whenever available   Avoid highly processed foods, and foods high in saturated/trans fats   Aim for low stress - take time to unwind and care for your mental health   Aim for 150 min of moderate intensity exercise weekly for heart health, and weights twice weekly for bone health   Aim for 7-9 hours of sleep daily   When it comes to diets, agreement about the perfect plan isnt easy to find, even among the experts. Experts at the [employer] developed an idea known as the Healthy Eating Plate. Just imagine a plate divided into logical, healthy portions.   The emphasis is on diet quality:   Load up on vegetables and fruits - one-half of your plate: Aim for color and variety, and remember that potatoes dont count.   Go for whole grains - one-quarter of your plate: Whole wheat, barley, wheat berries, quinoa,  oats, brown rice, and foods made with them. If you want pasta, go with whole wheat pasta.   Protein power - one-quarter of your plate: Fish, chicken, beans, and nuts are all healthy, versatile protein sources. Limit red meat.   The diet, however, does go beyond the plate, offering a few other suggestions.   Use healthy plant oils, such as olive, canola, soy, corn, sunflower and peanut. Check the labels, and avoid partially hydrogenated oil, which have unhealthy trans fats.   If youre thirsty,  drink water. Coffee and tea are good in moderation, but skip sugary drinks and limit milk and dairy products to one or two daily servings.   The type of carbohydrate in the diet is more important than the amount. Some sources of carbohydrates, such as vegetables, fruits, whole grains, and beans-are healthier than others.   Finally, stay active  Signed, Berniece Salines, DO  01/28/2020 3:37 PM    [facility]

## 2020-02-06 ENCOUNTER — Other Ambulatory Visit: Payer: Self-pay

## 2020-02-06 MED ORDER — ALPRAZOLAM 0.5 MG PO TABS: 0.5000 mg | ORAL_TABLET | Freq: Two times a day (BID) | ORAL | 0 refills | Status: DC

## 2020-02-06 MED ORDER — OXYCODONE-ACETAMINOPHEN 10-325 MG PO TABS: 1.4000 | ORAL_TABLET | Freq: Four times a day (QID) | ORAL | 0 refills | Status: DC | PRN

## 2020-02-11 ENCOUNTER — Other Ambulatory Visit: Payer: Self-pay

## 2020-02-11 ENCOUNTER — Other Ambulatory Visit: Payer: Self-pay | Admitting: Family Medicine

## 2020-02-11 MED ORDER — ALPRAZOLAM 0.5 MG PO TABS: 0.5000 mg | ORAL_TABLET | Freq: Two times a day (BID) | ORAL | 0 refills | Status: DC

## 2020-02-11 MED ORDER — OXYCODONE-ACETAMINOPHEN 10-325 MG PO TABS: 1.4000 | ORAL_TABLET | Freq: Four times a day (QID) | ORAL | 0 refills | Status: DC | PRN

## 2020-02-11 NOTE — Progress Notes (Signed)
Resent to local pharmacy

## 2020-02-12 ENCOUNTER — Telehealth: Payer: Self-pay | Admitting: Family Medicine

## 2020-02-12 NOTE — Progress Notes (Signed)
  Chronic Care Management   Note  02/12/2020 Name: Jesse [name] MRN: [id number] DOB: Nov 26, 1940  Jesse [name] is a 79 y.o. year old male who is a primary care patient of Cox, Kirsten, MD. I reached out to [name] by phone today in response to a referral sent by Jesse [name]'s PCP, Cox, Kirsten, MD.   Jesse [name] was given information about Chronic Care Management services today including:  1. CCM service includes personalized support from designated clinical staff supervised by his physician, including individualized plan of care and coordination with other care providers 2. 24/7 contact phone numbers for assistance for urgent and routine care needs. 3. Service will only be billed when office clinical staff spend 20 minutes or more in a month to coordinate care. 4. Only one practitioner may furnish and bill the service in a calendar month. 5. The patient may stop CCM services at any time (effective at the end of the month) by phone call to the office staff.   Patient agreed to services and verbal consent obtained.   This note is not being shared with the patient for the following reason: To respect privacy (The patient or proxy has requested that the information not be shared). Follow up plan:   [name] Upstream Scheduler

## 2020-02-13 ENCOUNTER — Telehealth: Payer: Self-pay | Admitting: *Deleted

## 2020-02-13 ENCOUNTER — Other Ambulatory Visit: Payer: Self-pay | Admitting: Cardiology

## 2020-02-13 MED ORDER — RANOLAZINE ER 500 MG PO TB12: 500.0000 mg | ORAL_TABLET | Freq: Two times a day (BID) | ORAL | 3 refills | Status: DC

## 2020-02-13 MED ORDER — RANOLAZINE ER 500 MG PO TB12: 500.0000 mg | ORAL_TABLET | Freq: Two times a day (BID) | ORAL | 0 refills | Status: DC

## 2020-02-13 NOTE — Telephone Encounter (Signed)
Pt can afford medication per wife. Pt advised to stop Sotalol today.  Start Ranexa 500 mg  BID Saturday evening, after 48 hour washout of Sotalol (as advised by pharmD). Advised to call office if issues begin after medication change. Advised to keep scheduled f/u w/ Camnitz on 6/14. Wife verbalized understanding and agreeable to plan.

## 2020-02-13 NOTE — Telephone Encounter (Signed)
Informed pt Dr. Curt Bears reviewed 5/11 EKG at Dr. Terrial Rhodes office.  Pt still having PVCs.  Recommendation is to switch to Ranexa 500 mg BID.  Will send in price quote Rx, pt will call tomorrow to check on cost. Aware I will follow up next week to see if affordable. Pt advised to continue current medications at this time, no change, will await to see if affordable prior to switching. Patient verbalized understanding and agreeable to plan.

## 2020-02-27 ENCOUNTER — Telehealth: Payer: Self-pay | Admitting: Cardiology

## 2020-02-27 NOTE — Telephone Encounter (Signed)
Patient's wife calling to request she come with the patient to his appointments 03/02/2020, because she states he has memory issues.

## 2020-02-27 NOTE — Telephone Encounter (Signed)
Pt's wife aware she is allowed to accompany Jesse [name] during his appointment.

## 2020-03-02 ENCOUNTER — Ambulatory Visit: Payer: Medicare Other | Admitting: Cardiology

## 2020-03-02 ENCOUNTER — Other Ambulatory Visit: Payer: Self-pay

## 2020-03-02 ENCOUNTER — Ambulatory Visit (INDEPENDENT_AMBULATORY_CARE_PROVIDER_SITE_OTHER): Payer: Medicare Other | Admitting: Cardiology

## 2020-03-02 ENCOUNTER — Encounter: Payer: Self-pay | Admitting: Cardiology

## 2020-03-02 VITALS — BP 97/74 | HR 54 | Ht 70.0 in | Wt 186.2 lb

## 2020-03-02 VITALS — BP 97/74 | HR 54 | Ht 70.0 in | Wt 186.0 lb

## 2020-03-02 DIAGNOSIS — I493 Ventricular premature depolarization: Secondary | ICD-10-CM | POA: Diagnosis not present

## 2020-03-02 DIAGNOSIS — I5032 Chronic diastolic (congestive) heart failure: Secondary | ICD-10-CM | POA: Diagnosis not present

## 2020-03-02 DIAGNOSIS — I350 Nonrheumatic aortic (valve) stenosis: Secondary | ICD-10-CM

## 2020-03-02 DIAGNOSIS — I25118 Atherosclerotic heart disease of native coronary artery with other forms of angina pectoris: Secondary | ICD-10-CM

## 2020-03-02 NOTE — Progress Notes (Signed)
Cardiology Office Note:    Date:  03/02/2020   ID:  Jesse [name], DOB 1940-11-04, MRN [id number]  PCP:  Rochel Brome, MD  Cardiologist:  No primary care provider on file.  Electrophysiologist:  Will Meredith Leeds, MD   Referring MD: Rochel Brome, MD   " I have been experiencing worsening leg swelling and low blood pressure"  History of Present Illness:    Jesse [name] is a 79 y.o. male with a hx of mild aortic stenosis, diastolic heart failure, frequent PVCs.  I did see the patient on Jan 28, 2020 at that time he had just been placed on sotalol.  During our visit EKGs did not show any worsening QTC he was continued on sotalol.  Leg edema.  I asked the patient to resume taking his Lasix given his leg edema is worsening.  He has been taken off sotalol due to low blood pressure and has been placed on Renexa.  Today with his wife, tells me he has been experiencing generalized fatigue, and weakness.  Past Medical History:  Diagnosis Date  . Anemia    takes Ferrous Sulfate daily  . Anxiety    takes Xanax daily  . Arthritis   . Atherosclerotic heart disease of native coronary artery without angina pectoris   . BPH (benign prostatic hyperplasia) 10/25/2017  . Bradycardia 10/25/2017  . CAD (coronary artery disease) 10/25/2017  . Chest pain 10/25/2017  . Chronic back pain   . Chronic ITP (idiopathic thrombocytopenia) (HCC) 08/08/2013  . Chronic lymphocytic leukemia ([place]) 10/25/2017  . Chronic lymphocytic leukemia of B-cell type not having achieved remission ([place])   . Chronic pain syndrome   . Constipation    takes Colace daily as needed  . Dry eyes    eye drops as needed  . Dyspnea on exertion 10/25/2017  . Essential (primary) hypertension   . History of blood transfusion    no abnormal reaction noted  . History of kidney stones   . Hyperlipemia 10/25/2017  . Hyperlipidemia    takes Pravastatin daily  . Hypertensive heart disease without heart failure   . Infusion reaction 4 yrs ago    platelet infusion broke out head to toe  . Insomnia    takes trazodone nightly as needed  . Leukemia ([place])   . Low back pain 12/12/2012  . Lumbar radiculopathy 01/09/2013  . Major depressive disorder, single episode, mild ([place])   . Muscle spasm    takes Robaxin daily as needed  . Nerve pain    takes Gabapentin daily   . Nocturia   . Opioid dependence, uncomplicated ([place])   . Other primary thrombocytopenia ([place])   . Other specified hypothyroidism   . Other specified intestinal infections   . Renal stones 10/25/2017  . Status post lumbar surgery 12/02/2015  . Thrombocytopenia, secondary 10/25/2017   Overview:  secondary to CLL  . Vitamin B12 deficiency    takes Vit B12 daily  . Weakness    numbness and tingling    Past Surgical History:  Procedure Laterality Date  . ANTERIOR LAT LUMBAR FUSION N/A 12/02/2015   Procedure: L3-4 ANTERIOR LATERAL LUMBAR FUSION w/lateral plate;  Surgeon: Eustace Moore, MD;  Location: [facility];  Service: Neurosurgery;  Laterality: N/A;  L3-4 ANTERIOR LATERAL LUMBAR FUSION w/lateral plate  . BACK SURGERY  2012   fusion x 2  . CATARACT EXTRACTION    . CERVICAL FUSION  1988   x 2  . COLONOSCOPY    .  FOOT SURGERY    . HERNIA REPAIR Left    inguinal  . KNEE SURGERY    . LAMINECTOMY    . plates/screws/rods removed  2015  . PVC ABLATION N/A 04/24/2019   Procedure: PVC ABLATION;  Surgeon: Constance Haw, MD;  Location: [facility];  Service: Cardiovascular;  Laterality: N/A;  . TOE SURGERY    . TRANSURETHRAL RESECTION OF PROSTATE      Current Medications: Current Meds  Medication Sig  . ALPRAZolam (XANAX) 0.5 MG tablet Take 1 tablet (0.5 mg total) by mouth 2 (two) times daily.  . folic acid (FOLVITE) 1 MG tablet Take 1 mg by mouth daily.  . furosemide (LASIX) 20 MG tablet Take 20 mg by mouth daily as needed (feet swelling).  . gabapentin (NEURONTIN) 600 MG tablet TAKE 1 TABLET BY MOUTH 3  TIMES DAILY  . levothyroxine (SYNTHROID) 100 MCG  tablet Take 1 tablet (100 mcg total) by mouth daily before breakfast.  . methocarbamol (ROBAXIN) 750 MG tablet Take 1 tablet (750 mg total) by mouth 4 (four) times daily.  Marland Kitchen oxyCODONE-acetaminophen (PERCOCET) 10-325 MG tablet Take 1.5 tablets by mouth every 6 (six) hours as needed for up to 150 doses for pain (back pain).  . pravastatin (PRAVACHOL) 80 MG tablet Take 1 tablet (80 mg total) by mouth every evening.  . ranolazine (RANEXA) 500 MG 12 hr tablet Take 1 tablet (500 mg total) by mouth 2 (two) times daily.  . Tetrahydrozoline-PEG (EYE DROPS EXTRA OP) Apply 1-2 drops to eye as needed.  . traZODone (DESYREL) 50 MG tablet Take 50-100 mg by mouth at bedtime as needed for sleep.   . [DISCONTINUED] sotalol (BETAPACE) 80 MG tablet Take 40 mg by mouth 2 (two) times daily.     Allergies:   Flecainide, Morphine and related, Niaspan [niacin], Propafenone, and Zoloft [sertraline]   Social History   Socioeconomic History  . Marital status: Married    Spouse name: Not on file  . Number of children: 1  . Years of education: Not on file  . Highest education level: Not on file  Occupational History  . Not on file  Tobacco Use  . Smoking status: Never Smoker  . Smokeless tobacco: Never Used  Vaping Use  . Vaping Use: Never used  Substance and Sexual Activity  . Alcohol use: No  . Drug use: No  . Sexual activity: Not Currently  Other Topics Concern  . Not on file  Social History Narrative  . Not on file   Social Determinants of Health   Financial Resource Strain:   . Difficulty of Paying Living Expenses:   Food Insecurity:   . Worried About Charity fundraiser in the Last Year:   . Arboriculturist in the Last Year:   Transportation Needs:   . Film/video editor (Medical):   Marland Kitchen Lack of Transportation (Non-Medical):   Physical Activity:   . Days of Exercise per Week:   . Minutes of Exercise per Session:   Stress:   . Feeling of Stress :   Social Connections:   . Frequency of  Communication with Friends and Family:   . Frequency of Social Gatherings with Friends and Family:   . Attends Religious Services:   . Active Member of Clubs or Organizations:   . Attends Archivist Meetings:   Marland Kitchen Marital Status:      Family History: The patient's family history includes CAD in his brother and father;  Congestive Heart Failure in his brother; Kidney Stones in his brother; Kidney failure in his brother; Leukemia in his brother; Prostate cancer in his brother; Throat cancer in his mother.  ROS:   Review of Systems  Constitution: Negative for decreased appetite, fever and weight gain.  HENT: Negative for congestion, ear discharge, hoarse voice and sore throat.   Eyes: Negative for discharge, redness, vision loss in right eye and visual halos.  Cardiovascular: Negative for chest pain, dyspnea on exertion, leg swelling, orthopnea and palpitations.  Respiratory: Negative for cough, hemoptysis, shortness of breath and snoring.   Endocrine: Negative for heat intolerance and polyphagia.  Hematologic/Lymphatic: Negative for bleeding problem. Does not bruise/bleed easily.  Skin: Negative for flushing, nail changes, rash and suspicious lesions.  Musculoskeletal: Negative for arthritis, joint pain, muscle cramps, myalgias, neck pain and stiffness.  Gastrointestinal: Negative for abdominal pain, bowel incontinence, diarrhea and excessive appetite.  Genitourinary: Negative for decreased libido, genital sores and incomplete emptying.  Neurological: Negative for brief paralysis, focal weakness, headaches and loss of balance.  Psychiatric/Behavioral: Negative for altered mental status, depression and suicidal ideas.  Allergic/Immunologic: Negative for HIV exposure and persistent infections.    EKGs/Labs/Other Studies Reviewed:    The following studies were reviewed today:   EKG:  None today  Recent Labs: 01/02/2020: ALT 13; BUN 15; Creatinine, Ser 1.11; Hemoglobin 11.7;  Platelets 43; Potassium 4.3; Sodium 142; TSH 6.790  Recent Lipid Panel    Component Value Date/Time   CHOL 123 01/02/2020 0852   TRIG 106 01/02/2020 0852   HDL 28 (L) 01/02/2020 0852   CHOLHDL 4.4 01/02/2020 0852   LDLCALC 75 01/02/2020 0852    Physical Exam:    VS:  BP 97/74 (BP Location: Left Arm, Patient Position: Sitting, Cuff Size: Normal)   Pulse (!) 54   Ht 5\' 10"  (1.778 m)   Wt 186 lb 3.2 oz (84.5 kg)   SpO2 94%   BMI 26.72 kg/m     Wt Readings from Last 3 Encounters:  03/02/20 186 lb (84.4 kg)  03/02/20 186 lb 3.2 oz (84.5 kg)  01/28/20 186 lb (84.4 kg)     GEN: Well nourished, well developed in no acute distress HEENT: Normal NECK: No JVD; No carotid bruits LYMPHATICS: No lymphadenopathy CARDIAC: S1S2 noted,RRR, no murmurs, rubs, gallops RESPIRATORY:  Clear to auscultation without rales, wheezing or rhonchi  ABDOMEN: Soft, non-tender, non-distended, +bowel sounds, no guarding. EXTREMITIES: No edema, No cyanosis, no clubbing MUSCULOSKELETAL:  No deformity  SKIN: Warm and dry NEUROLOGIC:  Alert and oriented x 3, non-focal PSYCHIATRIC:  Normal affect, good insight  ASSESSMENT:    1. Frequent PVCs   2. Coronary artery disease of native artery of native heart with stable angina pectoris ([place])   3. Chronic diastolic heart failure ([place])   4. Mild aortic stenosis    PLAN:     1.  He has not been able to tolerate any antiarrhythmics.  He is on Ranexa placed by EP for helping with his PVC burden.  Unlikely to change any medication at this time.  As he is going to be seeing EP right after this visit.  I am not sure if this plans for any PVCs ablation but he will be discussing further management with EP.  2.  He still does have bilateral leg edema.  He is going to continue taking his Lasix 20 mg daily.  3.  His blood pressure was mildly taken by me today in the office 108/62 mmHg. 4.  Blood work will be done today which will include BMP, mag  The patient is in  agreement with the above plan. The patient left the office in stable condition.  The patient will follow up in 3 months with Dr. Bettina Gavia.   Medication Adjustments/Labs and Tests Ordered: Current medicines are reviewed at length with the patient today.  Concerns regarding medicines are outlined above.  Orders Placed This Encounter  Procedures  . Basic Metabolic Panel (BMET)  . Magnesium  . Pro b natriuretic peptide   No orders of the defined types were placed in this encounter.   Patient Instructions  Medication Instructions:  Your physician recommends that you continue on your current medications as directed. Please refer to the Current Medication list given to you today.  *If you need a refill on your cardiac medications before your next appointment, please call your pharmacy*   Lab Work: Your physician recommends that you return for lab work in: [place] If you have labs (blood work) drawn today and your tests are completely normal, you will receive your results only by: Marland Kitchen MyChart Message (if you have MyChart) OR . A paper copy in the mail If you have any lab test that is abnormal or we need to change your treatment, we will call you to review the results.   Testing/Procedures: None   Follow-Up: At [facility], you and your health needs are our priority.  As part of our continuing mission to provide you with exceptional heart care, we have created designated Provider Care Teams.  These Care Teams include your primary Cardiologist (physician) and Advanced Practice Providers (APPs -  Physician Assistants and Nurse Practitioners) who all work together to provide you with the care you need, when you need it.  We recommend signing up for the patient portal called "MyChart".  Sign up information is provided on this After Visit Summary.  MyChart is used to connect with patients for Virtual Visits (Telemedicine).  Patients are able to view lab/test results, encounter notes,  upcoming appointments, etc.  Non-urgent messages can be sent to your provider as well.   To learn more about what you can do with MyChart, go to [email or web address].    Your next appointment:   3 month(s)  The format for your next appointment:   In Person  Provider:   Berniece Salines, DO   Other Instructions      Adopting a Healthy Lifestyle.  Know what a healthy weight is for you (roughly BMI <25) and aim to maintain this   Aim for 7+ servings of fruits and vegetables daily   65-80+ fluid ounces of water or unsweet tea for healthy kidneys   Limit to max 1 drink of alcohol per day; avoid smoking/tobacco   Limit animal fats in diet for cholesterol and heart health - choose grass fed whenever available   Avoid highly processed foods, and foods high in saturated/trans fats   Aim for low stress - take time to unwind and care for your mental health   Aim for 150 min of moderate intensity exercise weekly for heart health, and weights twice weekly for bone health   Aim for 7-9 hours of sleep daily   When it comes to diets, agreement about the perfect plan isnt easy to find, even among the experts. Experts at the [employer] developed an idea known as the Healthy Eating Plate. Just imagine a plate divided into logical, healthy portions.   The emphasis is  on diet quality:   Load up on vegetables and fruits - one-half of your plate: Aim for color and variety, and remember that potatoes dont count.   Go for whole grains - one-quarter of your plate: Whole wheat, barley, wheat berries, quinoa, oats, brown rice, and foods made with them. If you want pasta, go with whole wheat pasta.   Protein power - one-quarter of your plate: Fish, chicken, beans, and nuts are all healthy, versatile protein sources. Limit red meat.   The diet, however, does go beyond the plate, offering a few other suggestions.   Use healthy plant oils, such as olive, canola, soy, corn,  sunflower and peanut. Check the labels, and avoid partially hydrogenated oil, which have unhealthy trans fats.   If youre thirsty, drink water. Coffee and tea are good in moderation, but skip sugary drinks and limit milk and dairy products to one or two daily servings.   The type of carbohydrate in the diet is more important than the amount. Some sources of carbohydrates, such as vegetables, fruits, whole grains, and beans-are healthier than others.   Finally, stay active  Signed, Berniece Salines, DO  03/02/2020 8:53 PM    [facility]

## 2020-03-02 NOTE — Patient Instructions (Signed)
Medication Instructions:  Your physician recommends that you continue on your current medications as directed. Please refer to the Current Medication list given to you today.  *If you need a refill on your cardiac medications before your next appointment, please call your pharmacy*   Lab Work: None ordered If you have labs (blood work) drawn today and your tests are completely normal, you will receive your results only by: . MyChart Message (if you have MyChart) OR . A paper copy in the mail If you have any lab test that is abnormal or we need to change your treatment, we will call you to review the results.   Testing/Procedures: None ordered   Follow-Up: At [facility], you and your health needs are our priority.  As part of our continuing mission to provide you with exceptional heart care, we have created designated Provider Care Teams.  These Care Teams include your primary Cardiologist (physician) and Advanced Practice Providers (APPs -  Physician Assistants and Nurse Practitioners) who all work together to provide you with the care you need, when you need it.  We recommend signing up for the patient portal called "MyChart".  Sign up information is provided on this After Visit Summary.  MyChart is used to connect with patients for Virtual Visits (Telemedicine).  Patients are able to view lab/test results, encounter notes, upcoming appointments, etc.  Non-urgent messages can be sent to your provider as well.   To learn more about what you can do with MyChart, go to [email or web address].    Your next appointment:   6 month(s)  The format for your next appointment:   In Person  Provider:   Will Camnitz, MD   Thank you for choosing [facility]!!   Ido Wollman, RN [phone]    Other Instructions    

## 2020-03-02 NOTE — Progress Notes (Signed)
Electrophysiology Office Note   Date:  03/02/2020   ID:  Jesse [name], DOB July 01, 1941, MRN [id number]  PCP:  Rochel Brome, MD  Cardiologist:  Bettina Gavia Primary Electrophysiologist:  Constance Haw, MD    No chief complaint on file.    History of Present Illness: Jesse [name] is a 79 y.o. male who is being seen today for the evaluation of PVCs at the request of Shirlee More. Presenting today for electrophysiology evaluation.  Has a history significant for coronary artery disease, ITP, CLL, hypertension, hyperlipidemia.  He wore recent Holter monitor that showed 6.1% PVCs with episodes of bigeminy and trigeminy.  He has had some bradycardia associated with his PVCs.  Main symptoms associated with PVCs are weakness, fatigue, and shortness of breath.  He has no chest pain.  He does note that his heart rate is in the 30s at times.    Today, denies symptoms of palpitations, chest pain, shortness of breath, orthopnea, PND, lower extremity edema, claudication, dizziness, presyncope, syncope, bleeding, or neurologic sequela. The patient is tolerating medications without difficulties.  Unfortunately continues to have weakness and fatigue.  He continues to be somewhat aware of his PVCs.  He has unfortunately been unable to tolerate class I class III antiarrhythmics.  He is currently on ranolazine without much effect on his PVC burden.  He had an attempted ablation.  Prior to ablation, he worked with his oncologist to increase his platelet count, but unfortunately at presentation to ablation he was not having PVCs.   Past Medical History:  Diagnosis Date   Anemia    takes Ferrous Sulfate daily   Anxiety    takes Xanax daily   Arthritis    Atherosclerotic heart disease of native coronary artery without angina pectoris    BPH (benign prostatic hyperplasia) 10/25/2017   Bradycardia 10/25/2017   CAD (coronary artery disease) 10/25/2017   Chest pain 10/25/2017   Chronic back pain    Chronic ITP  (idiopathic thrombocytopenia) (HCC) 08/08/2013   Chronic lymphocytic leukemia ([place]) 10/25/2017   Chronic lymphocytic leukemia of B-cell type not having achieved remission (HCC)    Chronic pain syndrome    Constipation    takes Colace daily as needed   Dry eyes    eye drops as needed   Dyspnea on exertion 10/25/2017   Essential (primary) hypertension    History of blood transfusion    no abnormal reaction noted   History of kidney stones    Hyperlipemia 10/25/2017   Hyperlipidemia    takes Pravastatin daily   Hypertensive heart disease without heart failure    Infusion reaction 4 yrs ago   platelet infusion broke out head to toe   Insomnia    takes trazodone nightly as needed   Leukemia ([place])    Low back pain 12/12/2012   Lumbar radiculopathy 01/09/2013   Major depressive disorder, single episode, mild (HCC)    Muscle spasm    takes Robaxin daily as needed   Nerve pain    takes Gabapentin daily    Nocturia    Opioid dependence, uncomplicated (HCC)    Other primary thrombocytopenia ([place])    Other specified hypothyroidism    Other specified intestinal infections    Renal stones 10/25/2017   Status post lumbar surgery 12/02/2015   Thrombocytopenia, secondary 10/25/2017   Overview:  secondary to CLL   Vitamin B12 deficiency    takes Vit B12 daily   Weakness    numbness and tingling   Past Surgical  History:  Procedure Laterality Date   ANTERIOR LAT LUMBAR FUSION N/A 12/02/2015   Procedure: L3-4 ANTERIOR LATERAL LUMBAR FUSION w/lateral plate;  Surgeon: Eustace Moore, MD;  Location: [facility];  Service: Neurosurgery;  Laterality: N/A;  L3-4 ANTERIOR LATERAL LUMBAR FUSION w/lateral plate   BACK SURGERY  2012   fusion x 2   CATARACT EXTRACTION     CERVICAL FUSION  1988   x 2   COLONOSCOPY     FOOT SURGERY     HERNIA REPAIR Left    inguinal   KNEE SURGERY     LAMINECTOMY     plates/screws/rods removed  2015   PVC ABLATION N/A 04/24/2019    Procedure: PVC ABLATION;  Surgeon: Constance Haw, MD;  Location: [facility];  Service: Cardiovascular;  Laterality: N/A;   TOE SURGERY     TRANSURETHRAL RESECTION OF PROSTATE       Current Outpatient Medications  Medication Sig Dispense Refill   ALPRAZolam (XANAX) 0.5 MG tablet Take 1 tablet (0.5 mg total) by mouth 2 (two) times daily. 60 tablet 0   folic acid (FOLVITE) 1 MG tablet Take 1 mg by mouth daily.     furosemide (LASIX) 20 MG tablet Take 20 mg by mouth daily as needed (feet swelling).     gabapentin (NEURONTIN) 600 MG tablet TAKE 1 TABLET BY MOUTH 3  TIMES DAILY 270 tablet 3   levothyroxine (SYNTHROID) 100 MCG tablet Take 1 tablet (100 mcg total) by mouth daily before breakfast. 30 tablet 2   methocarbamol (ROBAXIN) 750 MG tablet Take 1 tablet (750 mg total) by mouth 4 (four) times daily. 360 tablet 0   oxyCODONE-acetaminophen (PERCOCET) 10-325 MG tablet Take 1.5 tablets by mouth every 6 (six) hours as needed for up to 150 doses for pain (back pain). 150 tablet 0   pravastatin (PRAVACHOL) 80 MG tablet Take 1 tablet (80 mg total) by mouth every evening. 90 tablet 1   ranolazine (RANEXA) 500 MG 12 hr tablet Take 1 tablet (500 mg total) by mouth 2 (two) times daily. 60 tablet 3   Tetrahydrozoline-PEG (EYE DROPS EXTRA OP) Apply 1-2 drops to eye as needed.     traZODone (DESYREL) 50 MG tablet Take 50-100 mg by mouth at bedtime as needed for sleep.      No current facility-administered medications for this visit.    Allergies:   Flecainide, Morphine and related, Niaspan [niacin], Propafenone, and Zoloft [sertraline]   Social History:  The patient  reports that he has never smoked. He has never used smokeless tobacco. He reports that he does not drink alcohol and does not use drugs.   Family History:  The patient's family history includes CAD in his brother and father; Congestive Heart Failure in his brother; Kidney Stones in his brother; Kidney failure in  his brother; Leukemia in his brother; Prostate cancer in his brother; Throat cancer in his mother.   ROS:  Please see the history of present illness.   Otherwise, review of systems is positive for none.   All other systems are reviewed and negative.   PHYSICAL EXAM: VS:  BP 97/74    Pulse (!) 54    Ht 5\' 10"  (1.778 m)    Wt 186 lb (84.4 kg)    SpO2 94%    BMI 26.69 kg/m  , BMI Body mass index is 26.69 kg/m. GEN: Well nourished, well developed, in no acute distress  HEENT: normal  Neck: no JVD, carotid  bruits, or masses Cardiac: Irregular, no murmurs, rubs, or gallops,no edema  Respiratory:  clear to auscultation bilaterally, normal work of breathing GI: soft, nontender, nondistended, + BS MS: no deformity or atrophy  Skin: warm and dry Neuro:  Strength and sensation are intact Psych: euthymic mood, full affect  EKG:  EKG is ordered today. Personal review of the ekg ordered shows sinus rhythm, ventricular bigeminy  Recent Labs: 01/02/2020: ALT 13; BUN 15; Creatinine, Ser 1.11; Hemoglobin 11.7; Platelets 43; Potassium 4.3; Sodium 142; TSH 6.790    Lipid Panel     Component Value Date/Time   CHOL 123 01/02/2020 0852   TRIG 106 01/02/2020 0852   HDL 28 (L) 01/02/2020 0852   CHOLHDL 4.4 01/02/2020 0852   LDLCALC 75 01/02/2020 0852     Wt Readings from Last 3 Encounters:  03/02/20 186 lb (84.4 kg)  03/02/20 186 lb 3.2 oz (84.5 kg)  01/28/20 186 lb (84.4 kg)      Other studies Reviewed: Additional studies/ records that were reviewed today include: TTE 11/20/17  Review of the above records today demonstrates:  - Left ventricle: The cavity size was normal. Wall thickness was   increased in a pattern of mild LVH. Systolic function was normal.   The estimated ejection fraction was in the range of 55% to 60%.   Wall motion was normal; there were no regional wall motion   abnormalities. Features are consistent with a pseudonormal left   ventricular filling pattern, with  concomitant abnormal relaxation   and increased filling pressure (grade 2 diastolic dysfunction).   Doppler parameters are consistent with high ventricular filling   pressure. - Aortic valve: There was mild stenosis. Valve area (VTI): 2.52   cm^2. Valve area (Vmax): 2.38 cm^2. Valve area (Vmean): 2.2 cm^2. - Mitral valve: There was mild regurgitation. - Left atrium: The atrium was mildly dilated. - Right atrium: The atrium was mildly dilated.  Cardiac Monitor 06/01/18 - personally reviewed The rhythm throughout his sinus with minimum average and maximum heart rates of 47, 61 and 127 bpm.  IVCD is noted.  The minimum rate is sinus bradycardia at 10:32 AM.  Ventricular ectopy, 6.1 % with frequent PVCs 309 couplets and one triplet.  There are episodes of bigeminy and trigeminy noted.  The episode of ventricular bigeminy persisted for 5 minutes and 46 seconds and trigeminy 1 minute and 34 seconds.  Supraventricular ectopy, 4.9% with rare couplets and no episodes of atrial tachycardia atrial fibrillation or flutter.  There were no bradycardic events, no episodes of sinus node or AV block or pauses of 3 seconds or greater.  There were no triggered or symptomatic events noted.  Conclusion, no significant bradycardia noted.  I suspect the episode of patient noted bradycardia with bigeminy and trigeminy with the pulse oximeter.  There are frequent PVCs and APCs that are asymptomatic.  ASSESSMENT AND PLAN:  1.  PVCs: Continues to have symptoms of weakness, fatigue, shortness of breath.  Currently on Ranexa.  Had an attempt to ablate, though he was not having PVCs.  He required platelet transfusions prior to the attempt due to thrombocytopenia.  His most recent cardiac monitor showed a PVC burden of only 6%.  I do feel that this may have gone up.  We Nicki Furlan repeat his monitor.  If his PVC burden is low, it is unlikely that this is the cause of his fatigue.  If it is significantly elevated, he may  require referral for a second opinion.  2.  Pseudobradycardia: due to elevated PVC burden.  3. Hypertension: Low today.  No changes.  4. Hyperlipidemia: Pravastatin per primary cardiology  Current medicines are reviewed at length with the patient today.   The patient does not have concerns regarding his medicines.  The following changes were made today: None  Labs/ tests ordered today include:  Orders Placed This Encounter  Procedures   EKG 12-Lead     Disposition:   FU with Keanna Tugwell 6 months  Signed, Dany Walther Meredith Leeds, MD  03/02/2020 2:31 PM     [facility] [address] [place] [address] [phone] (office) [phone] (fax)

## 2020-03-02 NOTE — Patient Instructions (Signed)
Medication Instructions:  Your physician recommends that you continue on your current medications as directed. Please refer to the Current Medication list given to you today.  *If you need a refill on your cardiac medications before your next appointment, please call your pharmacy*   Lab Work: Your physician recommends that you return for lab work in: TODAY BMP, Mag If you have labs (blood work) drawn today and your tests are completely normal, you will receive your results only by: . MyChart Message (if you have MyChart) OR . A paper copy in the mail If you have any lab test that is abnormal or we need to change your treatment, we will call you to review the results.   Testing/Procedures: None   Follow-Up: At [facility], you and your health needs are our priority.  As part of our continuing mission to provide you with exceptional heart care, we have created designated Provider Care Teams.  These Care Teams include your primary Cardiologist (physician) and Advanced Practice Providers (APPs -  Physician Assistants and Nurse Practitioners) who all work together to provide you with the care you need, when you need it.  We recommend signing up for the patient portal called "MyChart".  Sign up information is provided on this After Visit Summary.  MyChart is used to connect with patients for Virtual Visits (Telemedicine).  Patients are able to view lab/test results, encounter notes, upcoming appointments, etc.  Non-urgent messages can be sent to your provider as well.   To learn more about what you can do with MyChart, go to [email or web address].    Your next appointment:   3 month(s)  The format for your next appointment:   In Person  Provider:   Kardie Tobb, DO   Other Instructions   

## 2020-03-03 ENCOUNTER — Telehealth: Payer: Self-pay

## 2020-03-03 LAB — BASIC METABOLIC PANEL
BUN/Creatinine Ratio: 14 (ref 10–24)
BUN: 15 mg/dL (ref 8–27)
CO2: 23 mmol/L (ref 20–29)
Calcium: 8.6 mg/dL (ref 8.6–10.2)
Chloride: 105 mmol/L (ref 96–106)
Creatinine, Ser: 1.08 mg/dL (ref 0.76–1.27)
GFR calc Af Amer: 75 mL/min/{1.73_m2} (ref >59)
GFR calc non Af Amer: 64 mL/min/{1.73_m2} (ref >59)
Glucose: 96 mg/dL (ref 65–99)
Potassium: 4.4 mmol/L (ref 3.5–5.2)
Sodium: 145 mmol/L — ABNORMAL HIGH (ref 134–144)

## 2020-03-03 LAB — PRO B NATRIURETIC PEPTIDE: NT-Pro BNP: 1241 pg/mL — ABNORMAL HIGH (ref 0–486)

## 2020-03-03 LAB — MAGNESIUM: Magnesium: 2.1 mg/dL (ref 1.6–2.3)

## 2020-03-03 NOTE — Telephone Encounter (Signed)
-----   Message from Richardo Priest, MD sent at 03/03/2020  9:47 AM EDT ----- Normal or stable result  No changes

## 2020-03-03 NOTE — Telephone Encounter (Signed)
Spoke with patients wife regarding results and recommendation.  She verbalizes understanding and is agreeable to plan of care. Advised for the patient to call back with any issues or concerns.

## 2020-03-04 NOTE — Chronic Care Management (AMB) (Signed)
Chronic Care Management Pharmacy  Name: Jesse [name]  MRN: [id number] DOB: 1941-01-30  Chief Complaint/ HPI  Jesse [name],  79 y.o. , male presents for their Initial CCM visit with the clinical pharmacist via telephone due to COVID-19 Pandemic.  PCP : Rochel Brome, MD  Their chronic conditions include: CAD, Chronic Diastolic Heart Failure, Hypothyroidism, BPH, Thrombocytopenia, Anxiety, HLD, Low back pain, CLL.   Office Visits: 12/27/2019 - platelets are lowest PCP has seen. Forwarded results to Dr. Lissa Merlin.  09/25/2019 - routine visit.  Consult Visit: 03/09/2020 - Dr. 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 Should he require an intervention in the future we would be happy to restart Nplate at that time.  03/02/2020 - EP Cardiology - Continues to have symptoms of weakness, fatigue, shortness of breath.  Currently on Ranexa.  Had an attempt to ablate, though he was not having PVCs.  He required platelet transfusions prior to the attempt due to thrombocytopenia.  His most recent cardiac monitor showed a PVC burden of only 6%.  I do feel that this may have gone up.  We will repeat his monitor.  If his PVC burden is low, it is unlikely that this is the cause of his fatigue.  If it is significantly elevated, he may require referral for a second opinion. 03/02/2020 - Cardiology - continue lasix 20 mg daily.  02/13/2020 - Cardiology - Start Ranexa after 48 hour washout of Sotalol.  01/28/2020 - patient deferred increasing sotalol to 80 mg bid. Bilateral leg swelling but patient not taking lasix every day.  01/03/2020 - Continue sotalol normal QT interval. BP at target. Continue statin and loop diuretic.  11/25/2019 - [facility] - PVCs. Change amiodarone to mexiletine due to weakness. Sotalol 40 mg bid started with goal of titrating 80  mg bid. 09/23/2019 Bettina Gavia - continue current treatment. Recommend vaccine.  Medications: Outpatient Encounter Medications as of 03/10/2020  Medication Sig  . ALPRAZolam (XANAX) 0.5 MG tablet Take 1 tablet (0.5 mg total) by mouth 2 (two) times daily.  . folic acid (FOLVITE) 1 MG tablet Take 1 mg by mouth daily.  . furosemide (LASIX) 40 MG tablet Take 20 mg by mouth daily. Take 20 mg daily and uses 40 mg daily for weight gain of 3 lbs or increased foot swelling.  . gabapentin (NEURONTIN) 600 MG tablet TAKE 1 TABLET BY MOUTH 3  TIMES DAILY  . levothyroxine (SYNTHROID) 100 MCG tablet Take 1 tablet (100 mcg total) by mouth daily before breakfast.  . magnesium oxide (MAG-OX) 400 (241.3 Mg) MG tablet Take 400 mg by mouth daily.  . methocarbamol (ROBAXIN) 750 MG tablet Take 1 tablet (750 mg total) by mouth 4 (four) times daily. (Patient taking differently: Take 750 mg by mouth 3 (three) times daily. )  . Methotrexate 2.5 MG/ML SOLN Take 0.3 mLs by mouth once a week.  Marland Kitchen oxyCODONE-acetaminophen (PERCOCET) 10-325 MG tablet Take 1.5 tablets by mouth every 6 (six) hours as needed for up to 150 doses for pain (back pain). (Patient taking differently: Take 1 tablet by mouth every 8 (eight) hours. )  . pravastatin (PRAVACHOL) 80 MG tablet Take 1 tablet (80 mg total) by mouth every evening.  . ranolazine (RANEXA) 500 MG 12 hr tablet Take 1 tablet (500 mg total) by mouth 2 (two) times daily.  . Tetrahydrozoline-PEG (EYE  DROPS EXTRA OP) Apply 1-2 drops to eye as needed.  . TRACE MINERALS [id number] IV Inject into the vein daily. Using trace minerals - No Cramps product in water.  . traZODone (DESYREL) 50 MG tablet Take 50-100 mg by mouth at bedtime as needed for sleep.    No facility-administered encounter medications on file as of 03/10/2020.     Current Diagnosis/Assessment:  Goals Addressed            This Visit's Progress   . Pharmacy Care Plan       CARE PLAN ENTRY (see longitudinal plan of care  for additional care plan information)  Current Barriers:  . Chronic Disease Management support, education, and care coordination needs related to Hyperlipidemia and Heart Failure   Heart Failure BP Readings from Last 3 Encounters:  03/02/20 97/74  03/02/20 97/74  01/28/20 104/68   . Pharmacist Clinical Goal(s): o Over the next 90 days, patient will work with PharmD and providers to manage heart failure, maintain BP goal <140/90 and minimize swelling. . Current regimen:  o Furosemide 20 mg daily and 40 mg on days with weight gain greater than 3 lbs o Ranolazine 500 mg bid  . Interventions: o Recommend patient continue to elevate feet to reduce swelling.  o Recommend patient complete recommended heart monitoring for next steps on PVCs.  . Patient self care activities - Over the next 90 days, patient will: o Continue taking medication daily as prescribed. o Ensure daily salt intake < 2300 mg/day  Hyperlipidemia Lab Results  Component Value Date/Time   LDLCALC 75 01/02/2020 08:52 AM   . Pharmacist Clinical Goal(s): o Over the next 90 days, patient will work with PharmD and providers to maintain LDL goal < 100 . Current regimen:  o Pravastatin 80 mg daily . Interventions: o Recommend continuing to eat heart healthy diet.  . Patient self care activities - Over the next 90 days, patient will: o Continue to take medication as prescribed.   Anxiety . Pharmacist Clinical Goal(s) o Over the next 90 days, patient will work with PharmD and providers to manage anxiety . Current regimen:  o Alprazolam 0.5 mg bid  . Interventions: o Recommend patient discuss with Dr. Tobie Poet about alternative medications to help manage anxiety at next visit.  . Patient self care activities - Over the next 90 days, patient will: o Continue taking medication as prescribed and contact pharmacist or provider with any questions.     Medication management . Pharmacist Clinical Goal(s): o Over the next 90  days, patient will work with PharmD and providers to maintain optimal medication adherence . Current pharmacy: [facility] . Interventions o Comprehensive medication review performed. o Continue current medication management strategy . Patient self care activities - Over the next 90 days, patient will: o Focus on medication adherence by continuing to use chart.  o Take medications as prescribed o Report any questions or concerns to PharmD and/or provider(s)  Initial goal documentation       Hyperlipidemia   LDL goal < 100   Lipid Panel     Component Value Date/Time   CHOL 123 01/02/2020 0852   TRIG 106 01/02/2020 0852   HDL 28 (L) 01/02/2020 0852   LDLCALC 75 01/02/2020 0852    Hepatic Function Latest Ref Rng & Units 01/02/2020 12/24/2019 09/22/2018  Total Protein 6.0 - 8.5 g/dL 5.3(L) 5.4(L) 6.0(L)  Albumin 3.7 - 4.7 g/dL 3.7 3.9 3.8  AST 0 - 40 IU/L 31 33 30  ALT 0 - 44 IU/L '13 10 21  ' Alk Phosphatase 39 - 117 IU/L 95 95 84  Total Bilirubin 0.0 - 1.2 mg/dL 0.6 0.6 0.8     The ASCVD Risk score (St. Ignace., et al., 2013) failed to calculate for the following reasons:   The 2013 ASCVD risk score is only valid for ages 72 to 30   Patient has failed these meds in past: n/a Patient is currently controlled on the following medications:  . Pravastatin 80 mg every evening  We discussed:  diet and exercise extensively  Plan  Continue current medications   Heart Failure/PVCs   Type: Diastolic  Last ejection fraction: 55-60% NYHA Class: I (no actitivty limitation)   Patient has failed these meds in past: amiodarone, propafenone, sotalol, flecanide Patient is currently uncontrolled on the following medications: ranolazine 500 mg bid, furosemide 20 mg daily every day but if gains 3 pounds take 40 mg daily.  We discussed diet and exercise extensively Dr. Bettina Gavia is managing heart failure and Dr. Curt Bears is managing the PVCs. He is taking 40 mg -approx 3 times weekly.  His feet swell and he elevates them. He does not salt food and wife doesn't salt foods she cooks. His wife has prepared most meals in the past year due to pandemic.  Patient is going to wear a monitor for 3 days in attempts to determine more about PVCs and how to best address. Patient feels tired a lot and can barely make it to the mailbox and back.   Patient has been very cautious about going out even after vaccine. They've just started going back to church in the past 2 weeks.   Plan  Continue current medications   Hypothyroidism   Lab Results  Component Value Date/Time   TSH 6.790 (H) 01/02/2020 08:52 AM    Patient has failed these meds in past: n/a Patient is currently uncontrolled on the following medications:  . Levothyroxine 100 mcg daily  We discussed: TSH elevated. Patient's labs are scheduled to be rechecked in July.   Plan  Continue current medications    and  Other Diagnosis:Anxiety    Patient has failed these meds in past: n/a Patient is currently controlled on the following medications: alprazolam 0.5 mg bid  We discussed:  Was taking three daily but now takes twice daily. Patient's wife states that Dr. Tobie Poet is concerned with interaction between pain medication and anxiety medication. He really needs this for his nerves and has taken for 20 years. Patient has never tried another medication per wife. Patient's wife is interested in discussing a trial of duloxetine with Dr. Tobie Poet at next visit.    Plan  Continue current medications. Recommend considering adding a maintenance medication like duloxetine for anxiety and chronic pain.    Pain   Patient has failed these meds in past: celecoxib  Patient is currently uncontrolled on the following medications:  . Oxycodone 10-325 - 1 tablets q8h prn . Gabapentin 600 mg tid . Methocarbamol 750 mg qid   We discussed:  Methocarbamol is ~$40/month on patient's insurance but patient isn't interested in trying something  else at this time. Wife indicates that other medications are affordable. Discussed option of an anti-anxiety medication with pain management properties (duloxetine) with patient's wife. She is interested in discussing with Dr. Tobie Poet at next visit.   Plan  Continue current medications   Health Maintenance   Patient is currently uncontrolled on the following medications:  . Folic acid 1 mg  daily - supplementation due to MTX . Methotrexate shot weekly - vasculitis . Tetrahydrozoline eye drops - dry eyes.  . Trazodone 50 -100 mg qhs prn sleep . Magnesium tablet daily  . Trace minerals - no cramps - daily  We discussed:  Patient has vasculitis. Wife gives him an injection weekly at home. They were trying to titrate dose of MTX down but the rash came back. The rash looked like broke vessels at the surface. Dr. Jimmye Norman manages.   Patient gets up and down throughout the night and sleeps a good amount during the day.   Plan  Continue current medications  Vaccines   Reviewed and discussed patient's vaccination history.  [place] - September 30, 2019 and October 21, 2019.   Immunization History  Administered Date(s) Administered  . Influenza-Unspecified 06/19/2013, 06/19/2019  . PFIZER SARS-COV-2 Vaccination 09/30/2019, 10/21/2019  . Pneumococcal Conjugate-13 06/19/2014  . Pneumococcal Polysaccharide-23 08/31/2018  . Zoster 02/16/2011    Plan  Recommended patient receive annual flu vaccine in office.   Medication Management   Pt uses [facility] mail order pharmacy for all medications Uses pill box? Uses chart.  Pt endorses good compliance  We discussed: Wife uses a chart printed out and he can check off when he takes. Generic medications are free with mail order.   Plan  Continue current medication management strategy    Follow up: 6 month phone visit

## 2020-03-06 ENCOUNTER — Telehealth: Payer: Self-pay | Admitting: *Deleted

## 2020-03-06 DIAGNOSIS — I493 Ventricular premature depolarization: Secondary | ICD-10-CM

## 2020-03-06 NOTE — Telephone Encounter (Signed)
Wife made aware Dr. Curt Bears recommends 3 day monitor for PVC burden.  Aware if still high we will be referring pt to [facility] for a second opinion. Monitor instructions sent via mychart. Wife verbalized understanding and agreeable to plan.

## 2020-03-09 DIAGNOSIS — M545 Low back pain: Secondary | ICD-10-CM | POA: Diagnosis not present

## 2020-03-09 DIAGNOSIS — Z95828 Presence of other vascular implants and grafts: Secondary | ICD-10-CM | POA: Diagnosis not present

## 2020-03-09 DIAGNOSIS — G8929 Other chronic pain: Secondary | ICD-10-CM | POA: Diagnosis not present

## 2020-03-09 DIAGNOSIS — I493 Ventricular premature depolarization: Secondary | ICD-10-CM | POA: Diagnosis not present

## 2020-03-09 DIAGNOSIS — Z981 Arthrodesis status: Secondary | ICD-10-CM | POA: Diagnosis not present

## 2020-03-09 DIAGNOSIS — I776 Arteritis, unspecified: Secondary | ICD-10-CM | POA: Diagnosis not present

## 2020-03-09 DIAGNOSIS — C9110 Chronic lymphocytic leukemia of B-cell type not having achieved remission: Secondary | ICD-10-CM | POA: Diagnosis not present

## 2020-03-09 DIAGNOSIS — R5383 Other fatigue: Secondary | ICD-10-CM | POA: Diagnosis not present

## 2020-03-09 DIAGNOSIS — Z79899 Other long term (current) drug therapy: Secondary | ICD-10-CM | POA: Diagnosis not present

## 2020-03-10 ENCOUNTER — Ambulatory Visit: Payer: Medicare Other

## 2020-03-10 DIAGNOSIS — E782 Mixed hyperlipidemia: Secondary | ICD-10-CM

## 2020-03-10 DIAGNOSIS — I5032 Chronic diastolic (congestive) heart failure: Secondary | ICD-10-CM

## 2020-03-10 NOTE — Patient Instructions (Signed)
Visit Information  Thank you for your time discussing your medications. I look forward to working with you to achieve your health care goals. Below is a summary of what we talked about during our visit.   Goals Addressed            This Visit's Progress   . Pharmacy Care Plan       CARE PLAN ENTRY (see longitudinal plan of care for additional care plan information)  Current Barriers:  . Chronic Disease Management support, education, and care coordination needs related to Hyperlipidemia and Heart Failure   Heart Failure BP Readings from Last 3 Encounters:  03/02/20 97/74  03/02/20 97/74  01/28/20 104/68   . Pharmacist Clinical Goal(s): o Over the next 90 days, patient will work with PharmD and providers to manage heart failure, maintain BP goal <140/90 and minimize swelling. . Current regimen:  o Furosemide 20 mg daily and 40 mg on days with weight gain greater than 3 lbs o Ranolazine 500 mg bid  . Interventions: o Recommend patient continue to elevate feet to reduce swelling.  o Recommend patient complete recommended heart monitoring for next steps on PVCs.  . Patient self care activities - Over the next 90 days, patient will: o Continue taking medication daily as prescribed. o Ensure daily salt intake < 2300 mg/day  Hyperlipidemia Lab Results  Component Value Date/Time   LDLCALC 75 01/02/2020 08:52 AM   . Pharmacist Clinical Goal(s): o Over the next 90 days, patient will work with PharmD and providers to maintain LDL goal < 100 . Current regimen:  o Pravastatin 80 mg daily . Interventions: o Recommend continuing to eat heart healthy diet.  . Patient self care activities - Over the next 90 days, patient will: o Continue to take medication as prescribed.   Anxiety . Pharmacist Clinical Goal(s) o Over the next 90 days, patient will work with PharmD and providers to manage anxiety . Current regimen:  o Alprazolam 0.5 mg bid  . Interventions: o Recommend patient  discuss with Dr. Tobie Poet about alternative medications to help manage anxiety at next visit.  . Patient self care activities - Over the next 90 days, patient will: o Continue taking medication as prescribed and contact pharmacist or provider with any questions.     Medication management . Pharmacist Clinical Goal(s): o Over the next 90 days, patient will work with PharmD and providers to maintain optimal medication adherence . Current pharmacy: [facility] . Interventions o Comprehensive medication review performed. o Continue current medication management strategy . Patient self care activities - Over the next 90 days, patient will: o Focus on medication adherence by continuing to use chart.  o Take medications as prescribed o Report any questions or concerns to PharmD and/or provider(s)  Initial goal documentation        Jesse [name] was given information about Chronic Care Management services today including:  1. CCM service includes personalized support from designated clinical staff supervised by his physician, including individualized plan of care and coordination with other care providers 2. 24/7 contact phone numbers for assistance for urgent and routine care needs. 3. Standard insurance, coinsurance, copays and deductibles apply for chronic care management only during months in which we provide at least 20 minutes of these services. Most insurances cover these services at 100%, however patients may be responsible for any copay, coinsurance and/or deductible if applicable. This service may help you avoid the need for more expensive face-to-face services. 4. Only one  practitioner may furnish and bill the service in a calendar month. 5. The patient may stop CCM services at any time (effective at the end of the month) by phone call to the office staff.  Patient agreed to services and verbal consent obtained.   The patient verbalized understanding of instructions provided today and  agreed to receive a mailed copy of patient instruction and/or educational materials. Telephone follow up appointment with pharmacy team member scheduled for: 08/26/2020  Sherre Poot, PharmD Clinical Pharmacist [facility] [phone] (office) [phone] (mobile)  Exercises To Do While Sitting  Exercises that you do while sitting (chair exercises) can give you many of the same benefits as full exercise. Benefits include strengthening your heart, burning calories, and keeping muscles and joints healthy. Exercise can also improve your mood and help with depression and anxiety. You may benefit from chair exercises if you are unable to do standing exercises because of:  Diabetic foot pain.  Obesity.  Illness.  Arthritis.  Recovery from surgery or injury.  Breathing problems.  Balance problems.  Another type of disability. Before starting chair exercises, check with your health care provider or a physical therapist to find out how much exercise you can tolerate and which exercises are safe for you. If your health care provider approves:  Start out slowly and build up over time. Aim to work up to about 10-20 minutes for each exercise session.  Make exercise part of your daily routine.  Drink water when you exercise. Do not wait until you are thirsty. Drink every 10-15 minutes.  Stop exercising right away if you have pain, nausea, shortness of breath, or dizziness.  If you are exercising in a wheelchair, make sure to lock the wheels.  Ask your health care provider whether you can do tai chi or yoga. Many positions in these mind-body exercises can be modified to do while seated. Warm-up Before starting other exercises: 1. Sit up as straight as you can. Have your knees bent at 90 degrees, which is the shape of the capital letter "L." Keep your feet flat on the floor. 2. Sit at the front edge of your chair, if you can. 3. Pull in (tighten) the muscles in your abdomen  and stretch your spine and neck as straight as you can. Hold this position for a few minutes. 4. Breathe in and out evenly. Try to concentrate on your breathing, and relax your mind. Stretching Exercise A: Arm stretch 1. Hold your arms out straight in front of your body. 2. Bend your hands at the wrist with your fingers pointing up, as if signaling someone to stop. Notice the slight tension in your forearms as you hold the position. 3. Keeping your arms out and your hands bent, rotate your hands outward as far as you can and hold this stretch. Aim to have your thumbs pointing up and your pinkie fingers pointing down. Slowly repeat arm stretches for one minute as tolerated. Exercise B: Leg stretch 1. If you can move your legs, try to "draw" letters on the floor with the toes of your foot. Write your name with one foot. 2. Write your name with the toes of your other foot. Slowly repeat the movements for one minute as tolerated. Exercise C: Reach for the sky 1. Reach your hands as far over your head as you can to stretch your spine. 2. Move your hands and arms as if you are climbing a rope. Slowly repeat the movements for one minute as  tolerated. Range of motion exercises Exercise A: Shoulder roll 1. Let your arms hang loosely at your sides. 2. Lift just your shoulders up toward your ears, then let them relax back down. 3. When your shoulders feel loose, rotate your shoulders in backward and forward circles. Do shoulder rolls slowly for one minute as tolerated. Exercise B: March in place 1. As if you are marching, pump your arms and lift your legs up and down. Lift your knees as high as you can. ? If you are unable to lift your knees, just pump your arms and move your ankles and feet up and down. March in place for one minute as tolerated. Exercise C: Seated jumping jacks 1. Let your arms hang down straight. 2. Keeping your arms straight, lift them up over your head. Aim to point your  fingers to the ceiling. 3. While you lift your arms, straighten your legs and slide your heels along the floor to your sides, as wide as you can. 4. As you bring your arms back down to your sides, slide your legs back together. ? If you are unable to use your legs, just move your arms. Slowly repeat seated jumping jacks for one minute as tolerated. Strengthening exercises Exercise A: Shoulder squeeze 1. Hold your arms straight out from your body to your sides, with your elbows bent and your fists pointed at the ceiling. 2. Keeping your arms in the bent position, move them forward so your elbows and forearms meet in front of your face. 3. Open your arms back out as wide as you can with your elbows still bent, until you feel your shoulder blades squeezing together. Hold for 5 seconds. Slowly repeat the movements forward and backward for one minute as tolerated. Contact a health care provider if you:  Had to stop exercising due to any of the following: ? Pain. ? Nausea. ? Shortness of breath. ? Dizziness. ? Fatigue.  Have significant pain or soreness after exercising. Get help right away if you have:  Chest pain.  Difficulty breathing. These symptoms may represent a serious problem that is an emergency. Do not wait to see if the symptoms will go away. Get medical help right away. Call your local emergency services (911 in the [place] Do not drive yourself to the hospital. This information is not intended to replace advice given to you by your health care provider. Make sure you discuss any questions you have with your health care provider. Document Revised: 12/27/2018 Document Reviewed: 07/19/2017 Elsevier Patient Education  2020 [employer].

## 2020-03-11 ENCOUNTER — Other Ambulatory Visit: Payer: Self-pay

## 2020-03-11 MED ORDER — ALPRAZOLAM 0.5 MG PO TABS: 0.5000 mg | ORAL_TABLET | Freq: Two times a day (BID) | ORAL | 1 refills | Status: DC

## 2020-03-11 MED ORDER — OXYCODONE-ACETAMINOPHEN 10-325 MG PO TABS: 1.5000 | ORAL_TABLET | Freq: Four times a day (QID) | ORAL | 0 refills | Status: DC | PRN

## 2020-03-13 ENCOUNTER — Other Ambulatory Visit: Payer: Self-pay | Admitting: Cardiology

## 2020-03-17 ENCOUNTER — Ambulatory Visit (INDEPENDENT_AMBULATORY_CARE_PROVIDER_SITE_OTHER): Payer: Medicare Other

## 2020-03-17 DIAGNOSIS — I493 Ventricular premature depolarization: Secondary | ICD-10-CM

## 2020-03-18 ENCOUNTER — Other Ambulatory Visit: Payer: Self-pay | Admitting: Legal Medicine

## 2020-03-18 ENCOUNTER — Other Ambulatory Visit: Payer: Self-pay

## 2020-03-18 MED ORDER — DULOXETINE HCL 30 MG PO CSDR: DELAYED_RELEASE_CAPSULE | ORAL | 1 refills | Status: DC

## 2020-03-18 NOTE — Progress Notes (Deleted)
Pharmacist collaborated with Dr. Tobie Poet on duloxetine recommendation. Dr. Tobie Poet agrees with beginning duloxetine 30 mg daily in the morning for 1 month and then increased to 60 mg daily. Pharmacist will follow-up with patient in 3-4 weeks and patient will see provider in the first week of August. Will determine if patient needs to increase dosage at that time.   Spoke with patient and let him know that Dr. Tobie Poet agrees with a trial of duloxetine 30 mg daily with hopes that this will help with his anxiety/pain. Patient agrees and acknowledges understanding. Patient asked for Rx to be sent to [facility] on [address]. Patient acknowledge understanding to call pharmacist or office with any questions/concerns.   Sherre Poot, PharmD, [facility] Clinical Pharmacist [facility] [phone] (office) [phone] (mobile)

## 2020-03-18 NOTE — Chronic Care Management (AMB) (Signed)
Pharmacist collaborated with Dr. Tobie Poet on duloxetine recommendation. Dr. Tobie Poet agrees with beginning duloxetine 30 mg daily in the morning for 1 month and then increased to 60 mg daily. Pharmacist will follow-up with patient in 3-4 weeks as requested by Dr. Tobie Poet and patient will see provider in the first week of August. Will determine if patient needs to increase dosage at that time.   Spoke with patient and let him know that Dr. Tobie Poet agrees with a trial of duloxetine 30 mg daily with hopes that this will help with his anxiety/pain. Patient agrees and acknowledges understanding. Patient asked for Rx to be sent to [facility] on [address]. Patient acknowledge understanding to call pharmacist or office with any questions/concerns.   Sherre Poot, PharmD, [facility] Clinical Pharmacist [facility] [phone] (office) [phone] (mobile)

## 2020-03-19 ENCOUNTER — Other Ambulatory Visit: Payer: Self-pay

## 2020-03-19 MED ORDER — CYMBALTA 30 MG PO CPEP
30.0000 mg | ORAL_CAPSULE | Freq: Every day | ORAL | 1 refills | Status: DC
Start: 2020-03-19 — End: 2020-05-21

## 2020-03-29 ENCOUNTER — Other Ambulatory Visit: Payer: Self-pay | Admitting: Family Medicine

## 2020-03-31 DIAGNOSIS — L820 Inflamed seborrheic keratosis: Secondary | ICD-10-CM | POA: Diagnosis not present

## 2020-03-31 DIAGNOSIS — L958 Other vasculitis limited to the skin: Secondary | ICD-10-CM | POA: Diagnosis not present

## 2020-04-03 ENCOUNTER — Other Ambulatory Visit: Payer: Self-pay

## 2020-04-03 ENCOUNTER — Other Ambulatory Visit: Payer: Medicare Other

## 2020-04-03 DIAGNOSIS — E782 Mixed hyperlipidemia: Secondary | ICD-10-CM

## 2020-04-03 DIAGNOSIS — I5032 Chronic diastolic (congestive) heart failure: Secondary | ICD-10-CM

## 2020-04-03 NOTE — Progress Notes (Signed)
Patient had an appt on 03/10/2020 with Sherre Poot, PharmD.

## 2020-04-06 DIAGNOSIS — I493 Ventricular premature depolarization: Secondary | ICD-10-CM | POA: Diagnosis not present

## 2020-04-07 ENCOUNTER — Other Ambulatory Visit: Payer: Self-pay | Admitting: Family Medicine

## 2020-04-07 ENCOUNTER — Ambulatory Visit: Payer: Medicare Other | Admitting: Family Medicine

## 2020-04-09 ENCOUNTER — Ambulatory Visit: Payer: Medicare Other | Admitting: Cardiology

## 2020-04-19 ENCOUNTER — Other Ambulatory Visit: Payer: Self-pay | Admitting: Family Medicine

## 2020-04-19 DIAGNOSIS — I25110 Atherosclerotic heart disease of native coronary artery with unstable angina pectoris: Secondary | ICD-10-CM

## 2020-04-19 DIAGNOSIS — D6959 Other secondary thrombocytopenia: Secondary | ICD-10-CM

## 2020-04-19 DIAGNOSIS — C9110 Chronic lymphocytic leukemia of B-cell type not having achieved remission: Secondary | ICD-10-CM

## 2020-04-19 DIAGNOSIS — E039 Hypothyroidism, unspecified: Secondary | ICD-10-CM

## 2020-04-20 ENCOUNTER — Other Ambulatory Visit: Payer: Self-pay

## 2020-04-20 ENCOUNTER — Other Ambulatory Visit: Payer: Medicare Other

## 2020-04-20 DIAGNOSIS — D6959 Other secondary thrombocytopenia: Secondary | ICD-10-CM

## 2020-04-20 DIAGNOSIS — C9110 Chronic lymphocytic leukemia of B-cell type not having achieved remission: Secondary | ICD-10-CM | POA: Diagnosis not present

## 2020-04-20 DIAGNOSIS — E039 Hypothyroidism, unspecified: Secondary | ICD-10-CM

## 2020-04-20 DIAGNOSIS — I25110 Atherosclerotic heart disease of native coronary artery with unstable angina pectoris: Secondary | ICD-10-CM | POA: Diagnosis not present

## 2020-04-21 ENCOUNTER — Encounter: Payer: Self-pay | Admitting: Family Medicine

## 2020-04-21 ENCOUNTER — Other Ambulatory Visit: Payer: Self-pay

## 2020-04-21 LAB — COMPREHENSIVE METABOLIC PANEL
ALT: 7 IU/L (ref 0–44)
AST: 22 IU/L (ref 0–40)
Albumin/Globulin Ratio: 2.3 — ABNORMAL HIGH (ref 1.2–2.2)
Albumin: 3.5 g/dL — ABNORMAL LOW (ref 3.7–4.7)
Alkaline Phosphatase: 89 IU/L (ref 48–121)
BUN/Creatinine Ratio: 11 (ref 10–24)
BUN: 11 mg/dL (ref 8–27)
Bilirubin Total: 0.5 mg/dL (ref 0.0–1.2)
CO2: 25 mmol/L (ref 20–29)
Calcium: 8.5 mg/dL — ABNORMAL LOW (ref 8.6–10.2)
Chloride: 105 mmol/L (ref 96–106)
Creatinine, Ser: 1 mg/dL (ref 0.76–1.27)
GFR calc Af Amer: 82 mL/min/{1.73_m2} (ref >59)
GFR calc non Af Amer: 71 mL/min/{1.73_m2} (ref >59)
Globulin, Total: 1.5 g/dL (ref 1.5–4.5)
Glucose: 90 mg/dL (ref 65–99)
Potassium: 4.5 mmol/L (ref 3.5–5.2)
Sodium: 142 mmol/L (ref 134–144)
Total Protein: 5 g/dL — ABNORMAL LOW (ref 6.0–8.5)

## 2020-04-21 LAB — LIPID PANEL
Chol/HDL Ratio: 4.6 ratio (ref 0.0–5.0)
Cholesterol, Total: 120 mg/dL (ref 100–199)
HDL: 26 mg/dL — ABNORMAL LOW (ref >39)
LDL Chol Calc (NIH): 73 mg/dL (ref 0–99)
Triglycerides: 115 mg/dL (ref 0–149)
VLDL Cholesterol Cal: 21 mg/dL (ref 5–40)

## 2020-04-21 LAB — CBC WITH DIFFERENTIAL/PLATELET
Basophils Absolute: 0 10*3/uL (ref 0.0–0.2)
Basos: 1 %
EOS (ABSOLUTE): 0.2 10*3/uL (ref 0.0–0.4)
Eos: 6 %
Hematocrit: 33.2 % — ABNORMAL LOW (ref 37.5–51.0)
Hemoglobin: 11.1 g/dL — ABNORMAL LOW (ref 13.0–17.7)
Immature Grans (Abs): 0 10*3/uL (ref 0.0–0.1)
Immature Granulocytes: 0 %
Lymphocytes Absolute: 0.9 10*3/uL (ref 0.7–3.1)
Lymphs: 26 %
MCH: 30.6 pg (ref 26.6–33.0)
MCHC: 33.4 g/dL (ref 31.5–35.7)
MCV: 92 fL (ref 79–97)
Monocytes Absolute: 0.2 10*3/uL (ref 0.1–0.9)
Monocytes: 7 %
Neutrophils Absolute: 1.9 10*3/uL (ref 1.4–7.0)
Neutrophils: 60 %
Platelets: 48 10*3/uL — CL (ref 150–450)
RBC: 3.63 x10E6/uL — ABNORMAL LOW (ref 4.14–5.80)
RDW: 14.2 % (ref 11.6–15.4)
WBC: 3.2 10*3/uL — ABNORMAL LOW (ref 3.4–10.8)

## 2020-04-21 LAB — TSH: TSH: 5.68 u[IU]/mL — ABNORMAL HIGH (ref 0.450–4.500)

## 2020-04-21 LAB — CARDIOVASCULAR RISK ASSESSMENT

## 2020-04-21 MED ORDER — METHOCARBAMOL 750 MG PO TABS: 750.0000 mg | ORAL_TABLET | Freq: Three times a day (TID) | ORAL | 1 refills | Status: DC

## 2020-04-23 ENCOUNTER — Ambulatory Visit (INDEPENDENT_AMBULATORY_CARE_PROVIDER_SITE_OTHER): Payer: Medicare Other | Admitting: Family Medicine

## 2020-04-23 ENCOUNTER — Encounter: Payer: Self-pay | Admitting: Family Medicine

## 2020-04-23 ENCOUNTER — Other Ambulatory Visit: Payer: Self-pay

## 2020-04-23 VITALS — BP 124/70 | HR 80 | Temp 98.0°F | Resp 16 | Ht 70.0 in | Wt 181.6 lb

## 2020-04-23 DIAGNOSIS — I25118 Atherosclerotic heart disease of native coronary artery with other forms of angina pectoris: Secondary | ICD-10-CM | POA: Diagnosis not present

## 2020-04-23 DIAGNOSIS — C9110 Chronic lymphocytic leukemia of B-cell type not having achieved remission: Secondary | ICD-10-CM

## 2020-04-23 DIAGNOSIS — G894 Chronic pain syndrome: Secondary | ICD-10-CM

## 2020-04-23 DIAGNOSIS — E039 Hypothyroidism, unspecified: Secondary | ICD-10-CM | POA: Diagnosis not present

## 2020-04-23 DIAGNOSIS — F1120 Opioid dependence, uncomplicated: Secondary | ICD-10-CM

## 2020-04-23 DIAGNOSIS — M5440 Lumbago with sciatica, unspecified side: Secondary | ICD-10-CM

## 2020-04-23 DIAGNOSIS — G8929 Other chronic pain: Secondary | ICD-10-CM

## 2020-04-23 DIAGNOSIS — E782 Mixed hyperlipidemia: Secondary | ICD-10-CM | POA: Diagnosis not present

## 2020-04-23 DIAGNOSIS — D6959 Other secondary thrombocytopenia: Secondary | ICD-10-CM | POA: Diagnosis not present

## 2020-04-23 MED ORDER — LEVOTHYROXINE SODIUM 112 MCG PO TABS: 112.0000 ug | ORAL_TABLET | Freq: Every day | ORAL | 0 refills | Status: DC

## 2020-04-23 NOTE — Progress Notes (Signed)
Established Patient Office Visit  Subjective:  Patient ID: Jesse [name], male    DOB: 01/17/1941  Age: 79 y.o. MRN: [id number]  CC:  Chief Complaint  Patient presents with  . Hyperlipidemia    HPI [name] presents for chronic follow up.  Pt presents with hyperlipidemia.  Current treatment includes Pravachol.  Compliance with treatment has been good; he takes his medication as directed, maintains his low cholesterol diet, and follows up as directed.  He denies experiencing any hypercholesterolemia related symptoms.      Other specified hypothyroidism details; date of diagnosis 2014.  He is currently taking Synthroid, 100 mcg daily.  His tsh is not at goal. He denies any related symptoms.      Concerning atherosclerotic heart disease of native coronary artery without angina pectoris, he is here today for routine follow-up.  Currently, his treatment regimen consists of a ranexa, diuretic ( furosemide ) and Pravachol.  Takes lasix occasionally.He denies chest pain and lower extremity edema.  [name] is compliant with low fat diet, low salt diet, tobacco avoidance, and taking medications as recommended and prescribed.      [name] presents with a diagnosis of chronic lymphocytic leukemia of B-cell type in remission.  He has persistent thrombocytopenia. The course has been stable and nonprogressive.  Sees Dr. Lissa Merlin.  Chronic pain syndrome due to low back pain. His pain is 8/10.  She is taking percocet, robaxin.   Past Medical History:  Diagnosis Date  . Anemia    takes Ferrous Sulfate daily  . Anxiety    takes Xanax daily  . Arthritis   . Atherosclerotic heart disease of native coronary artery without angina pectoris   . BPH (benign prostatic hyperplasia) 10/25/2017  . Bradycardia 10/25/2017  . CAD (coronary artery disease) 10/25/2017  . Chest pain 10/25/2017  . Chronic back pain   . Chronic ITP (idiopathic thrombocytopenia) (HCC) 08/08/2013  . Chronic lymphocytic leukemia ([place]) 10/25/2017   . Chronic lymphocytic leukemia of B-cell type not having achieved remission ([place])   . Chronic pain syndrome   . Constipation    takes Colace daily as needed  . Dry eyes    eye drops as needed  . Dyspnea on exertion 10/25/2017  . Essential (primary) hypertension   . History of blood transfusion    no abnormal reaction noted  . History of kidney stones   . Hyperlipemia 10/25/2017  . Hyperlipidemia    takes Pravastatin daily  . Hypertensive heart disease without heart failure   . Infusion reaction 4 yrs ago   platelet infusion broke out head to toe  . Insomnia    takes trazodone nightly as needed  . Leukemia ([place])   . Low back pain 12/12/2012  . Lumbar radiculopathy 01/09/2013  . Major depressive disorder, single episode, mild ([place])   . Muscle spasm    takes Robaxin daily as needed  . Nerve pain    takes Gabapentin daily   . Nocturia   . Opioid dependence, uncomplicated ([place])   . Other primary thrombocytopenia ([place])   . Other specified hypothyroidism   . Other specified intestinal infections   . Renal stones 10/25/2017  . Status post lumbar surgery 12/02/2015  . Thrombocytopenia, secondary 10/25/2017   Overview:  secondary to CLL  . Vitamin B12 deficiency    takes Vit B12 daily  . Weakness    numbness and tingling    Past Surgical History:  Procedure Laterality Date  . ANTERIOR LAT LUMBAR FUSION N/A 12/02/2015  Procedure: L3-4 ANTERIOR LATERAL LUMBAR FUSION w/lateral plate;  Surgeon: Eustace Moore, MD;  Location: [place] [facility];  Service: Neurosurgery;  Laterality: N/A;  L3-4 ANTERIOR LATERAL LUMBAR FUSION w/lateral plate  . BACK SURGERY  2012   fusion x 2  . CATARACT EXTRACTION    . CERVICAL FUSION  1988   x 2  . COLONOSCOPY    . FOOT SURGERY    . HERNIA REPAIR Left    inguinal  . KNEE SURGERY    . LAMINECTOMY    . plates/screws/rods removed  2015  . PVC ABLATION N/A 04/24/2019   Procedure: PVC ABLATION;  Surgeon: Constance Haw, MD;  Location: [place] [facility];   Service: Cardiovascular;  Laterality: N/A;  . TOE SURGERY    . TRANSURETHRAL RESECTION OF PROSTATE      Family History  Problem Relation Age of Onset  . Congestive Heart Failure Brother   . CAD Brother   . Leukemia Brother   . Prostate cancer Brother   . Kidney failure Brother   . Kidney Stones Brother   . Throat cancer Mother   . CAD Father     Social History   Socioeconomic History  . Marital status: Married    Spouse name: Not on file  . Number of children: 1  . Years of education: Not on file  . Highest education level: Not on file  Occupational History  . Not on file  Tobacco Use  . Smoking status: Never Smoker  . Smokeless tobacco: Never Used  Vaping Use  . Vaping Use: Never used  Substance and Sexual Activity  . Alcohol use: No  . Drug use: No  . Sexual activity: Not Currently  Other Topics Concern  . Not on file  Social History Narrative  . Not on file   Social Determinants of Health   Financial Resource Strain:   . Difficulty of Paying Living Expenses:   Food Insecurity: No Food Insecurity  . Worried About Charity fundraiser in the Last Year: Never true  . Ran Out of Food in the Last Year: Never true  Transportation Needs: No Transportation Needs  . Lack of Transportation (Medical): No  . Lack of Transportation (Non-Medical): No  Physical Activity:   . Days of Exercise per Week:   . Minutes of Exercise per Session:   Stress:   . Feeling of Stress :   Social Connections:   . Frequency of Communication with Friends and Family:   . Frequency of Social Gatherings with Friends and Family:   . Attends Religious Services:   . Active Member of Clubs or Organizations:   . Attends Archivist Meetings:   Marland Kitchen Marital Status:   Intimate Partner Violence:   . Fear of Current or Ex-Partner:   . Emotionally Abused:   Marland Kitchen Physically Abused:   . Sexually Abused:     Outpatient Medications Prior to Visit  Medication Sig Dispense Refill  .  ALPRAZolam (XANAX) 0.5 MG tablet Take 1 tablet (0.5 mg total) by mouth 2 (two) times daily. 60 tablet 1  . CYMBALTA 30 MG capsule Take 1 capsule (30 mg total) by mouth daily. 30 capsule 1  . folic acid (FOLVITE) 1 MG tablet Take 1 mg by mouth daily.    . furosemide (LASIX) 40 MG tablet Take 20 mg by mouth daily. Take 20 mg daily and uses 40 mg daily for weight gain of 3 lbs or increased foot swelling.    Marland Kitchen  gabapentin (NEURONTIN) 600 MG tablet TAKE 1 TABLET BY MOUTH 3  TIMES DAILY 270 tablet 3  . magnesium oxide (MAG-OX) 400 (241.3 Mg) MG tablet Take 400 mg by mouth daily.    . methocarbamol (ROBAXIN) 750 MG tablet Take 1 tablet (750 mg total) by mouth 3 (three) times daily. 90 tablet 1  . Methotrexate 2.5 MG/ML SOLN Take 0.3 mLs by mouth once a week.    Marland Kitchen oxyCODONE-acetaminophen (PERCOCET) 10-325 MG tablet Take 1.5 tablets by mouth every 6 (six) hours as needed for up to 150 doses for pain (back pain). 150 tablet 0  . pravastatin (PRAVACHOL) 80 MG tablet TAKE 1 TABLET BY MOUTH IN  THE EVENING 90 tablet 2  . ranolazine (RANEXA) 500 MG 12 hr tablet TAKE 1 TABLET(500 MG) BY MOUTH TWICE DAILY 180 tablet 3  . Tetrahydrozoline-PEG (EYE DROPS EXTRA OP) Apply 1-2 drops to eye as needed.    . TRACE MINERALS CR-CU-MN-ZN IV Inject into the vein daily. Using trace minerals - No Cramps product in water.    . traZODone (DESYREL) 50 MG tablet Take 50-100 mg by mouth at bedtime as needed for sleep.     Marland Kitchen levothyroxine (SYNTHROID) 100 MCG tablet TAKE 1 TABLET(100 MCG) BY MOUTH DAILY BEFORE BREAKFAST 30 tablet 2   No facility-administered medications prior to visit.    Allergies  Allergen Reactions  . Flecainide   . Morphine And Related Other (See Comments)    sedation  . Niaspan [Niacin] Other (See Comments)    flushing  . Propafenone Other (See Comments)    somnolence  . Zoloft [Sertraline] Other (See Comments)    Drowsiness    ROS Review of Systems  Constitutional: Positive for fatigue. Negative  for chills and fever.  HENT: Negative for congestion, ear pain and sore throat.   Respiratory: Negative for cough and shortness of breath.   Cardiovascular: Positive for leg swelling (L>R. sometimes. ). Negative for chest pain and palpitations.  Gastrointestinal: Negative for abdominal pain, constipation, diarrhea, nausea and vomiting.  Endocrine: Negative for polydipsia, polyphagia and polyuria.  Genitourinary: Negative for dysuria and urgency.  Musculoskeletal: Positive for back pain (Chronic. severe. 8/10.).  Neurological: Positive for light-headedness. Negative for dizziness and headaches.  Psychiatric/Behavioral: Negative for dysphoric mood.       Anhedonia      Objective:    Physical Exam Vitals reviewed.  Constitutional:      Appearance: Normal appearance. He is well-developed.  Cardiovascular:     Rate and Rhythm: Normal rate and regular rhythm.     Heart sounds: Normal heart sounds.  Pulmonary:     Effort: Pulmonary effort is normal.     Breath sounds: Normal breath sounds.  Abdominal:     General: Bowel sounds are normal.     Palpations: Abdomen is soft.     Tenderness: There is no abdominal tenderness.  Musculoskeletal:        General: Tenderness (lumbar back. large scar midline from upper lumbar to sacrum.) present.  Neurological:     Mental Status: He is alert and oriented to person, place, and time.  Psychiatric:        Mood and Affect: Mood normal.        Behavior: Behavior normal.     BP 124/70   Pulse 80   Temp 98 F (36.7 C)   Resp 16   Ht 5\' 10"  (1.778 m)   Wt 181 lb 9.6 oz (82.4 kg)   BMI 26.06 kg/m  Wt Readings from Last 3 Encounters:  04/23/20 181 lb 9.6 oz (82.4 kg)  03/02/20 186 lb (84.4 kg)  03/02/20 186 lb 3.2 oz (84.5 kg)     Health Maintenance Due  Topic Date Due  . TETANUS/TDAP  Never done  . INFLUENZA VACCINE  04/19/2020    There are no preventive care reminders to display for this patient.  Lab Results  Component Value  Date   TSH 5.680 (H) 04/20/2020   Lab Results  Component Value Date   WBC 3.2 (L) 04/20/2020   HGB 11.1 (L) 04/20/2020   HCT 33.2 (L) 04/20/2020   MCV 92 04/20/2020   PLT 48 (LL) 04/20/2020   Lab Results  Component Value Date   NA 142 04/20/2020   K 4.5 04/20/2020   CO2 25 04/20/2020   GLUCOSE 90 04/20/2020   BUN 11 04/20/2020   CREATININE 1.00 04/20/2020   BILITOT 0.5 04/20/2020   ALKPHOS 89 04/20/2020   AST 22 04/20/2020   ALT 7 04/20/2020   PROT 5.0 (L) 04/20/2020   ALBUMIN 3.5 (L) 04/20/2020   CALCIUM 8.5 (L) 04/20/2020   ANIONGAP 6 09/22/2018   Lab Results  Component Value Date   CHOL 120 04/20/2020   Lab Results  Component Value Date   HDL 26 (L) 04/20/2020   Lab Results  Component Value Date   LDLCALC 73 04/20/2020   Lab Results  Component Value Date   TRIG 115 04/20/2020   Lab Results  Component Value Date   CHOLHDL 4.6 04/20/2020   No results found for: HGBA1C    Assessment & Plan:  1. Mixed hyperlipidemia Well controlled.  No changes to medicines.  Continue to work on eating a healthy diet and exercise.   2. Acquired hypothyroidism INCREASE SYNTHROID TO 112 MCG DAILY  3. Chronic lymphocytic leukemia ([place]) Follow up with Dr. Lissa Merlin.   4. Coronary artery disease of native artery of native heart with stable angina pectoris [facility]) The current medical regimen is effective;  continue present plan and medications. Keep follow up with cardiology.   5. Thrombocytopenia, secondary Low, but stable.  Follow up with Dr. Lissa Merlin  6. Chronic low back pain with sciatica, sciatica laterality unspecified, unspecified back pain laterality Moderate control.  No changes to medicines.   7. Chronic pain syndrome Moderate control.  No changes to medicines.  8. Uncomplicated opioid dependence ([place]) The current medical regimen is effective;  continue present plan and medications.  Meds ordered this encounter  Medications  . levothyroxine (SYNTHROID) 112  MCG tablet    Sig: Take 1 tablet (112 mcg total) by mouth daily.    Dispense:  90 tablet    Refill:  0    Follow-up: Return in about 3 months (around 07/24/2020).    Rochel Brome, MD

## 2020-04-26 ENCOUNTER — Encounter: Payer: Self-pay | Admitting: Family Medicine

## 2020-05-05 ENCOUNTER — Other Ambulatory Visit: Payer: Self-pay

## 2020-05-05 ENCOUNTER — Telehealth: Payer: Self-pay

## 2020-05-05 NOTE — Progress Notes (Addendum)
Chronic Care Management Pharmacy Assistant   Name: Jesse [name]  MRN: [id number] DOB: [birth year]  Reason for Encounter: Medication Review  Patient Questions:  1.  Have you seen any other providers since your last visit? Yes, see note below  2.  Any changes in your medicines or health? Yes, see note below   04/23/2020- OV to PCP- Dr. Tobie [name]. For Mixed hyperlipidemia- reports well controlled, no changes made to medications. Hypothyroidism- Increased synthroid to 112 Mcg daily.   PCP : Jesse Brome, MD  Allergies:   Allergies  Allergen Reactions  . Flecainide   . Morphine And Related Other (See Comments)    sedation  . Niaspan [Niacin] Other (See Comments)    flushing  . Propafenone Other (See Comments)    somnolence  . Zoloft [Sertraline] Other (See Comments)    Drowsiness    Medications: Outpatient Encounter Medications as of 05/05/2020  Medication Sig  . ALPRAZolam (XANAX) 0.5 MG tablet Take 1 tablet (0.5 mg total) by mouth 2 (two) times daily.  . CYMBALTA 30 MG capsule Take 1 capsule (30 mg total) by mouth daily.  . folic acid (FOLVITE) 1 MG tablet Take 1 mg by mouth daily.  . furosemide (LASIX) 40 MG tablet Take 20 mg by mouth daily. Take 20 mg daily and uses 40 mg daily for weight gain of 3 lbs or increased foot swelling.  . gabapentin (NEURONTIN) 600 MG tablet TAKE 1 TABLET BY MOUTH 3  TIMES DAILY  . levothyroxine (SYNTHROID) 112 MCG tablet Take 1 tablet (112 mcg total) by mouth daily.  . magnesium oxide (MAG-OX) 400 (241.3 Mg) MG tablet Take 400 mg by mouth daily.  . methocarbamol (ROBAXIN) 750 MG tablet Take 1 tablet (750 mg total) by mouth 3 (three) times daily.  . Methotrexate 2.5 MG/ML SOLN Take 0.3 mLs by mouth once a week.  Marland Kitchen oxyCODONE-acetaminophen (PERCOCET) 10-325 MG tablet Take 1.5 tablets by mouth every 6 (six) hours as needed for up to 150 doses for pain (back pain).  . pravastatin (PRAVACHOL) 80 MG tablet TAKE 1 TABLET BY MOUTH IN  THE EVENING  . ranolazine  (RANEXA) 500 MG 12 hr tablet TAKE 1 TABLET(500 MG) BY MOUTH TWICE DAILY  . Tetrahydrozoline-PEG (EYE DROPS EXTRA OP) Apply 1-2 drops to eye as needed.  . TRACE MINERALS CR-CU-MN-ZN IV Inject into the vein daily. Using trace minerals - No Cramps product in water.  . traZODone (DESYREL) 50 MG tablet Take 50-100 mg by mouth at bedtime as needed for sleep.    No facility-administered encounter medications on file as of 05/05/2020.    Current Diagnosis: Patient Active Problem List   Diagnosis Date Noted  . Sick sinus syndrome ([place]) 01/07/2020  . Acquired hypothyroidism 01/07/2020  . Uncomplicated opioid dependence ([place]) 01/07/2020  . PVC's (premature ventricular contractions) 06/20/2018  . Postoperative examination 02/27/2018  . Umbilical hernia without obstruction and without gangrene 01/23/2018  . Umbilical pain 74/04/1447  . Mild aortic stenosis 12/12/2017  . Chronic diastolic heart failure ([place]) 11/14/2017  . Elevated brain natriuretic peptide (BNP) level 11/14/2017  . Edema 11/14/2017  . Vasculitis determined by biopsy of skin ([place]) 10/26/2017  . Anxiety 10/25/2017  . Chronic lymphocytic leukemia ([place]) 10/25/2017  . Hyperlipemia 10/25/2017  . Thrombocytopenia, secondary 10/25/2017  . Bradycardia 10/25/2017  . Dyspnea on exertion 10/25/2017  . CAD (coronary artery disease) 10/25/2017  . BPH (benign prostatic hyperplasia) 10/25/2017  . Renal stones 10/25/2017  . Status post lumbar surgery 12/02/2015  .  Lumbar radiculopathy 01/09/2013  . Low back pain 12/12/2012    Goals Addressed   None     Follow-Up:  Coordination of [facility] Pharmacy Services   Reached out to patient today for medication adherence call. Specifically asking about the Cymbalta. Patient reports he did not pick this medication up from pharmacy therefore he has not started it. Advised patient I would let Jesse [name] and Dr Jesse [name] that he felt this one is not necessary as he states he tried it before awhile back and  it didn't help then.   Jesse [name], [place] Pharmacist Assistant  [phone]   Pharmacist scheduled a follow-up visit with patient.   Jesse [name], PharmD, [facility] Clinical Pharmacist [facility] [phone] (office) [phone] (mobile)

## 2020-05-06 MED ORDER — OXYCODONE-ACETAMINOPHEN 10-325 MG PO TABS: 1.5000 | ORAL_TABLET | Freq: Four times a day (QID) | ORAL | 0 refills | Status: DC | PRN

## 2020-05-07 ENCOUNTER — Other Ambulatory Visit: Payer: Self-pay

## 2020-05-07 MED ORDER — ALPRAZOLAM 0.5 MG PO TABS: 0.5000 mg | ORAL_TABLET | Freq: Two times a day (BID) | ORAL | 1 refills | Status: DC

## 2020-05-13 ENCOUNTER — Telehealth: Payer: Medicare Other

## 2020-05-19 ENCOUNTER — Other Ambulatory Visit: Payer: Self-pay

## 2020-05-19 DIAGNOSIS — I739 Peripheral vascular disease, unspecified: Secondary | ICD-10-CM

## 2020-05-21 ENCOUNTER — Other Ambulatory Visit: Payer: Self-pay | Admitting: Family Medicine

## 2020-06-02 ENCOUNTER — Other Ambulatory Visit: Payer: Self-pay | Admitting: Family Medicine

## 2020-06-04 ENCOUNTER — Ambulatory Visit: Payer: Medicare Other | Admitting: Cardiology

## 2020-06-04 NOTE — Progress Notes (Signed)
Cardiology Office Note:    Date:  06/05/2020   ID:  Jesse [name], DOB [birth year], MRN [id number]  PCP:  Rochel Brome, MD  Cardiologist:  Shirlee More, MD    Referring MD: Rochel Brome, MD    ASSESSMENT:    1. Coronary artery disease of native artery of native heart with stable angina pectoris ([place])   2. Chronic diastolic heart failure ([place])   3. Frequent PVCs   4. Hypertensive heart disease with heart failure ([place])   5. Mixed hyperlipidemia    PLAN:    In order of problems listed above:  1. In general he is doing well CAD is stable having no anginal discomfort continue medical therapy. 2. Stable I think his peripheral edema is more related to gabapentin I have asked him to do's to twice daily and to limit the dose if possible causes profound sodium retention 3. Stable much improved continue ranolazine 4. BP at target continue current treatment 5. Stable lipids at target continue statin   Next appointment: 6 months   Medication Adjustments/Labs and Tests Ordered: Current medicines are reviewed at length with the patient today.  Concerns regarding medicines are outlined above.  No orders of the defined types were placed in this encounter.  Meds ordered this encounter  Medications  . gabapentin (NEURONTIN) 600 MG tablet    Sig: Take 1 tablet (600 mg total) by mouth 2 (two) times daily.    Dispense:  270 tablet    Refill:  3    Requesting 1 year supply    No chief complaint on file.   History of Present Illness:    Jesse [name] is a 79 y.o. male with a hx of mild aortic stenosis, diastolic heart failure and frequent PVCs  last seen 01/03/2020. Compliance with diet, lifestyle and medications: Yes  He used ZIO monitor in July surprisingly showing less than 1% APCs and PVCs.  Is taking gabapentin and notices increased peripheral edema but not shortness of breath.  No chest pain palpitation or syncope and in general is pleased with the quality of his life. Past  Medical History:  Diagnosis Date  . Anemia    takes Ferrous Sulfate daily  . Anxiety    takes Xanax daily  . Arthritis   . Atherosclerotic heart disease of native coronary artery without angina pectoris   . BPH (benign prostatic hyperplasia) 10/25/2017  . Bradycardia 10/25/2017  . CAD (coronary artery disease) 10/25/2017  . Chest pain 10/25/2017  . Chronic back pain   . Chronic ITP (idiopathic thrombocytopenia) (HCC) 08/08/2013  . Chronic lymphocytic leukemia ([place]) 10/25/2017  . Chronic lymphocytic leukemia of B-cell type not having achieved remission ([place])   . Chronic pain syndrome   . Constipation    takes Colace daily as needed  . Dry eyes    eye drops as needed  . Dyspnea on exertion 10/25/2017  . Essential (primary) hypertension   . History of blood transfusion    no abnormal reaction noted  . History of kidney stones   . Hyperlipemia 10/25/2017  . Hyperlipidemia    takes Pravastatin daily  . Hypertensive heart disease without heart failure   . Infusion reaction 4 yrs ago   platelet infusion broke out head to toe  . Insomnia    takes trazodone nightly as needed  . Leukemia ([place])   . Low back pain 12/12/2012  . Lumbar radiculopathy 01/09/2013  . Major depressive disorder, single episode, mild ([place])   . Muscle spasm  takes Robaxin daily as needed  . Nerve pain    takes Gabapentin daily   . Nocturia   . Opioid dependence, uncomplicated ([place])   . Other primary thrombocytopenia ([place])   . Other specified hypothyroidism   . Other specified intestinal infections   . Renal stones 10/25/2017  . Status post lumbar surgery 12/02/2015  . Thrombocytopenia, secondary 10/25/2017   Overview:  secondary to CLL  . Vitamin B12 deficiency    takes Vit B12 daily  . Weakness    numbness and tingling    Past Surgical History:  Procedure Laterality Date  . ANTERIOR LAT LUMBAR FUSION N/A 12/02/2015   Procedure: L3-4 ANTERIOR LATERAL LUMBAR FUSION w/lateral plate;  Surgeon: Eustace Moore, MD;   Location: [facility];  Service: Neurosurgery;  Laterality: N/A;  L3-4 ANTERIOR LATERAL LUMBAR FUSION w/lateral plate  . BACK SURGERY  2012   fusion x 2  . CATARACT EXTRACTION    . CERVICAL FUSION  1988   x 2  . COLONOSCOPY    . FOOT SURGERY    . HERNIA REPAIR Left    inguinal  . KNEE SURGERY    . LAMINECTOMY    . plates/screws/rods removed  2015  . PVC ABLATION N/A 04/24/2019   Procedure: PVC ABLATION;  Surgeon: Constance Haw, MD;  Location: [facility];  Service: Cardiovascular;  Laterality: N/A;  . TOE SURGERY    . TRANSURETHRAL RESECTION OF PROSTATE      Current Medications: Current Meds  Medication Sig  . ALPRAZolam (XANAX) 0.5 MG tablet Take 1 tablet (0.5 mg total) by mouth 2 (two) times daily.  . DULoxetine (CYMBALTA) 30 MG capsule TAKE 1 CAPSULE(30 MG) BY MOUTH DAILY  . folic acid (FOLVITE) 1 MG tablet Take 1 mg by mouth daily.  . furosemide (LASIX) 40 MG tablet Take 20 mg by mouth daily. Take 20 mg daily and uses 40 mg daily for weight gain of 3 lbs or increased foot swelling.  . gabapentin (NEURONTIN) 600 MG tablet Take 1 tablet (600 mg total) by mouth 2 (two) times daily.  Marland Kitchen levothyroxine (SYNTHROID) 112 MCG tablet Take 1 tablet (112 mcg total) by mouth daily.  . magnesium oxide (MAG-OX) 400 (241.3 Mg) MG tablet Take 400 mg by mouth daily.  . methocarbamol (ROBAXIN) 750 MG tablet Take 1 tablet (750 mg total) by mouth 3 (three) times daily.  . Methotrexate 2.5 MG/ML SOLN Take 0.3 mLs by mouth once a week.  Marland Kitchen oxyCODONE-acetaminophen (PERCOCET) 10-325 MG tablet Take 1.5 tablets by mouth every 6 (six) hours as needed for up to 150 doses for pain (back pain).  . pravastatin (PRAVACHOL) 80 MG tablet TAKE 1 TABLET BY MOUTH IN  THE EVENING  . ranolazine (RANEXA) 500 MG 12 hr tablet TAKE 1 TABLET(500 MG) BY MOUTH TWICE DAILY  . Tetrahydrozoline-PEG (EYE DROPS EXTRA OP) Apply 1-2 drops to eye as needed.  . TRACE MINERALS CR-CU-MN-ZN IV Inject into the vein daily.  Using trace minerals - No Cramps product in water.  . traZODone (DESYREL) 50 MG tablet TAKE 1 TO 2 TABLET BY MOUTH EVERY NIGHT AT BEDTIME AS  NEEDED.  . [DISCONTINUED] gabapentin (NEURONTIN) 600 MG tablet TAKE 1 TABLET BY MOUTH 3  TIMES DAILY     Allergies:   Flecainide, Morphine and related, Niaspan [niacin], Propafenone, and Zoloft [sertraline]   Social History   Socioeconomic History  . Marital status: Married    Spouse name: Not on file  . Number  of children: 1  . Years of education: Not on file  . Highest education level: Not on file  Occupational History  . Not on file  Tobacco Use  . Smoking status: Never Smoker  . Smokeless tobacco: Never Used  Vaping Use  . Vaping Use: Never used  Substance and Sexual Activity  . Alcohol use: No  . Drug use: No  . Sexual activity: Not Currently  Other Topics Concern  . Not on file  Social History Narrative  . Not on file   Social Determinants of Health   Financial Resource Strain:   . Difficulty of Paying Living Expenses: Not on file  Food Insecurity: No Food Insecurity  . Worried About Charity fundraiser in the Last Year: Never true  . Ran Out of Food in the Last Year: Never true  Transportation Needs: No Transportation Needs  . Lack of Transportation (Medical): No  . Lack of Transportation (Non-Medical): No  Physical Activity:   . Days of Exercise per Week: Not on file  . Minutes of Exercise per Session: Not on file  Stress:   . Feeling of Stress : Not on file  Social Connections:   . Frequency of Communication with Friends and Family: Not on file  . Frequency of Social Gatherings with Friends and Family: Not on file  . Attends Religious Services: Not on file  . Active Member of Clubs or Organizations: Not on file  . Attends Archivist Meetings: Not on file  . Marital Status: Not on file     Family History: The patient's family history includes CAD in his brother and father; Congestive Heart Failure in his  brother; Kidney Stones in his brother; Kidney failure in his brother; Leukemia in his brother; Prostate cancer in his brother; Throat cancer in his mother. ROS:   Please see the history of present illness.    All other systems reviewed and are negative.  EKGs/Labs/Other Studies Reviewed:    The following studies were reviewed today:   Recent Labs: 03/02/2020: Magnesium 2.1; NT-Pro BNP 1,241 04/20/2020: ALT 7; BUN 11; Creatinine, Ser 1.00; Hemoglobin 11.1; Platelets 48; Potassium 4.5; Sodium 142; TSH 5.680  Recent Lipid Panel    Component Value Date/Time   CHOL 120 04/20/2020 0951   TRIG 115 04/20/2020 0951   HDL 26 (L) 04/20/2020 0951   CHOLHDL 4.6 04/20/2020 0951   LDLCALC 73 04/20/2020 0951    Physical Exam:    VS:  BP 112/62 (BP Location: Right Arm, Patient Position: Sitting, Cuff Size: Normal)   Pulse 65   Ht 5\' 10"  (1.778 m)   Wt 178 lb 12.8 oz (81.1 kg)   SpO2 97%   BMI 25.66 kg/m     Wt Readings from Last 3 Encounters:  06/05/20 178 lb 12.8 oz (81.1 kg)  04/23/20 181 lb 9.6 oz (82.4 kg)  03/02/20 186 lb (84.4 kg)     GEN:  Well nourished, well developed in no acute distress HEENT: Normal NECK: No JVD; No carotid bruits LYMPHATICS: No lymphadenopathy CARDIAC: RRR, no murmurs, rubs, gallops RESPIRATORY:  Clear to auscultation without rales, wheezing or rhonchi  ABDOMEN: Soft, non-tender, non-distended MUSCULOSKELETAL: 1-2+ pitting edema predominately around the ankles and dorsum of feet bilateral edema; No deformity  SKIN: Warm and dry NEUROLOGIC:  Alert and oriented x 3 PSYCHIATRIC:  Normal affect    Signed, Shirlee More, MD  06/05/2020 12:04 PM    [place]

## 2020-06-05 ENCOUNTER — Other Ambulatory Visit: Payer: Self-pay

## 2020-06-05 ENCOUNTER — Encounter: Payer: Self-pay | Admitting: Cardiology

## 2020-06-05 ENCOUNTER — Ambulatory Visit: Payer: Medicare Other | Admitting: Cardiology

## 2020-06-05 VITALS — BP 112/62 | HR 65 | Ht 70.0 in | Wt 178.8 lb

## 2020-06-05 DIAGNOSIS — I5032 Chronic diastolic (congestive) heart failure: Secondary | ICD-10-CM | POA: Diagnosis not present

## 2020-06-05 DIAGNOSIS — I493 Ventricular premature depolarization: Secondary | ICD-10-CM | POA: Diagnosis not present

## 2020-06-05 DIAGNOSIS — E782 Mixed hyperlipidemia: Secondary | ICD-10-CM | POA: Diagnosis not present

## 2020-06-05 DIAGNOSIS — I110 Hypertensive heart disease with heart failure: Secondary | ICD-10-CM

## 2020-06-05 DIAGNOSIS — I25118 Atherosclerotic heart disease of native coronary artery with other forms of angina pectoris: Secondary | ICD-10-CM

## 2020-06-05 MED ORDER — GABAPENTIN 600 MG PO TABS: 600.0000 mg | ORAL_TABLET | Freq: Two times a day (BID) | ORAL | 3 refills | Status: AC

## 2020-06-05 NOTE — Patient Instructions (Signed)
Medication Instructions:  Your physician has recommended you make the following change in your medication:  DECREASE: Gabapentin 600 mg take one tablet by mouth twice daily.  *If you need a refill on your cardiac medications before your next appointment, please call your pharmacy*   Lab Work: None If you have labs (blood work) drawn today and your tests are completely normal, you will receive your results only by: Marland Kitchen MyChart Message (if you have MyChart) OR . A paper copy in the mail If you have any lab test that is abnormal or we need to change your treatment, we will call you to review the results.   Testing/Procedures: None   Follow-Up: At [facility], you and your health needs are our priority.  As part of our continuing mission to provide you with exceptional heart care, we have created designated Provider Care Teams.  These Care Teams include your primary Cardiologist (physician) and Advanced Practice Providers (APPs -  Physician Assistants and Nurse Practitioners) who all work together to provide you with the care you need, when you need it.  We recommend signing up for the patient portal called "MyChart".  Sign up information is provided on this After Visit Summary.  MyChart is used to connect with patients for Virtual Visits (Telemedicine).  Patients are able to view lab/test results, encounter notes, upcoming appointments, etc.  Non-urgent messages can be sent to your provider as well.   To learn more about what you can do with MyChart, go to [email or web address].    Your next appointment:   6 month(s)  The format for your next appointment:   In Person  Provider:   Shirlee More, MD   Other Instructions

## 2020-06-08 ENCOUNTER — Other Ambulatory Visit: Payer: Self-pay

## 2020-06-08 MED ORDER — ALPRAZOLAM 0.5 MG PO TABS
0.5000 mg | ORAL_TABLET | Freq: Two times a day (BID) | ORAL | 1 refills | Status: DC
Start: 2020-06-08 — End: 2020-06-10

## 2020-06-08 MED ORDER — OXYCODONE-ACETAMINOPHEN 10-325 MG PO TABS: 1.5000 | ORAL_TABLET | Freq: Four times a day (QID) | ORAL | 0 refills | Status: DC | PRN

## 2020-06-10 ENCOUNTER — Other Ambulatory Visit: Payer: Self-pay

## 2020-06-10 MED ORDER — ALPRAZOLAM 0.5 MG PO TABS
0.5000 mg | ORAL_TABLET | Freq: Two times a day (BID) | ORAL | 1 refills | Status: DC
Start: 2020-06-10 — End: 2020-08-10

## 2020-06-10 MED ORDER — OXYCODONE-ACETAMINOPHEN 10-325 MG PO TABS
1.5000 | ORAL_TABLET | Freq: Four times a day (QID) | ORAL | 0 refills | Status: DC | PRN
Start: 2020-06-10 — End: 2020-06-12

## 2020-06-12 ENCOUNTER — Other Ambulatory Visit: Payer: Self-pay | Admitting: Family Medicine

## 2020-06-12 ENCOUNTER — Other Ambulatory Visit: Payer: Self-pay

## 2020-06-12 MED ORDER — OXYCODONE-ACETAMINOPHEN 10-325 MG PO TABS
1.5000 | ORAL_TABLET | Freq: Four times a day (QID) | ORAL | 0 refills | Status: AC | PRN
Start: 2020-06-12 — End: 2020-06-17

## 2020-06-15 ENCOUNTER — Ambulatory Visit: Payer: Medicare Other | Admitting: Cardiology

## 2020-06-15 ENCOUNTER — Other Ambulatory Visit: Payer: Self-pay

## 2020-06-15 ENCOUNTER — Encounter: Payer: Self-pay | Admitting: Cardiology

## 2020-06-15 VITALS — BP 120/56 | HR 56 | Ht 70.0 in | Wt 178.0 lb

## 2020-06-15 DIAGNOSIS — I493 Ventricular premature depolarization: Secondary | ICD-10-CM

## 2020-06-15 NOTE — Patient Instructions (Signed)
Medication Instructions:  Your physician recommends that you continue on your current medications as directed. Please refer to the Current Medication list given to you today.  *If you need a refill on your cardiac medications before your next appointment, please call your pharmacy*   Lab Work: None ordered If you have labs (blood work) drawn today and your tests are completely normal, you will receive your results only by: . MyChart Message (if you have MyChart) OR . A paper copy in the mail If you have any lab test that is abnormal or we need to change your treatment, we will call you to review the results.   Testing/Procedures: None ordered   Follow-Up: At [facility], you and your health needs are our priority.  As part of our continuing mission to provide you with exceptional heart care, we have created designated Provider Care Teams.  These Care Teams include your primary Cardiologist (physician) and Advanced Practice Providers (APPs -  Physician Assistants and Nurse Practitioners) who all work together to provide you with the care you need, when you need it.  We recommend signing up for the patient portal called "MyChart".  Sign up information is provided on this After Visit Summary.  MyChart is used to connect with patients for Virtual Visits (Telemedicine).  Patients are able to view lab/test results, encounter notes, upcoming appointments, etc.  Non-urgent messages can be sent to your provider as well.   To learn more about what you can do with MyChart, go to [email or web address].    Your next appointment:   6 month(s)  The format for your next appointment:   In Person  Provider:   Will Camnitz, MD   Thank you for choosing [facility]!!   Caspian Deleonardis, RN [phone]    Other Instructions    

## 2020-06-15 NOTE — Progress Notes (Signed)
Electrophysiology Office Note   Date:  06/15/2020   ID:  Jesse [name], DOB 02-Jul-1941, MRN [id number]  PCP:  Jesse Brome, MD  Cardiologist:  Jesse [name] Primary Electrophysiologist:  Jesse Haw, MD    No chief complaint on file.    History of Present Illness: Jesse [name] is a 79 y.o. male who is being seen today for the evaluation of PVCs at the request of Shirlee More. Presenting today for electrophysiology evaluation.  Has a history significant for coronary artery disease, ITP, CLL, hypertension, hyperlipidemia.  He wore recent Holter monitor that showed 6.1% PVCs with episodes of bigeminy and trigeminy.  He has had some bradycardia associated with his PVCs.  Main symptoms associated with PVCs are weakness, fatigue, and shortness of breath.  He has no chest pain.  He does note that his heart rate is in the 30s at times.    Today, denies symptoms of palpitations, chest pain, shortness of breath, orthopnea, PND, lower extremity edema, claudication, dizziness, presyncope, syncope, bleeding, or neurologic sequela. The patient is tolerating medications without difficulties.  Since last being seen he has done well.  He notes no further issues with bradycardia or PVCs.  He is able do all of his daily activities and is without restriction.   Past Medical History:  Diagnosis Date  . Anemia    takes Ferrous Sulfate daily  . Anxiety    takes Xanax daily  . Arthritis   . Atherosclerotic heart disease of native coronary artery without angina pectoris   . BPH (benign prostatic hyperplasia) 10/25/2017  . Bradycardia 10/25/2017  . CAD (coronary artery disease) 10/25/2017  . Chest pain 10/25/2017  . Chronic back pain   . Chronic ITP (idiopathic thrombocytopenia) (HCC) 08/08/2013  . Chronic lymphocytic leukemia ([place]) 10/25/2017  . Chronic lymphocytic leukemia of B-cell type not having achieved remission ([place])   . Chronic pain syndrome   . Constipation    takes Colace daily as needed  . Dry eyes     eye drops as needed  . Dyspnea on exertion 10/25/2017  . Essential (primary) hypertension   . History of blood transfusion    no abnormal reaction noted  . History of kidney stones   . Hyperlipemia 10/25/2017  . Hyperlipidemia    takes Pravastatin daily  . Hypertensive heart disease without heart failure   . Infusion reaction 4 yrs ago   platelet infusion broke out head to toe  . Insomnia    takes trazodone nightly as needed  . Leukemia ([place])   . Low back pain 12/12/2012  . Lumbar radiculopathy 01/09/2013  . Major depressive disorder, single episode, mild ([place])   . Muscle spasm    takes Robaxin daily as needed  . Nerve pain    takes Gabapentin daily   . Nocturia   . Opioid dependence, uncomplicated ([place])   . Other primary thrombocytopenia ([place])   . Other specified hypothyroidism   . Other specified intestinal infections   . Renal stones 10/25/2017  . Status post lumbar surgery 12/02/2015  . Thrombocytopenia, secondary 10/25/2017   Overview:  secondary to CLL  . Vitamin B12 deficiency    takes Vit B12 daily  . Weakness    numbness and tingling   Past Surgical History:  Procedure Laterality Date  . ANTERIOR LAT LUMBAR FUSION N/A 12/02/2015   Procedure: L3-4 ANTERIOR LATERAL LUMBAR FUSION w/lateral plate;  Surgeon: Eustace Moore, MD;  Location: [facility];  Service: Neurosurgery;  Laterality: N/A;  L3-4  ANTERIOR LATERAL LUMBAR FUSION w/lateral plate  . BACK SURGERY  2012   fusion x 2  . CATARACT EXTRACTION    . CERVICAL FUSION  1988   x 2  . COLONOSCOPY    . FOOT SURGERY    . HERNIA REPAIR Left    inguinal  . KNEE SURGERY    . LAMINECTOMY    . plates/screws/rods removed  2015  . PVC ABLATION N/A 04/24/2019   Procedure: PVC ABLATION;  Surgeon: Jesse Haw, MD;  Location: [facility];  Service: Cardiovascular;  Laterality: N/A;  . TOE SURGERY    . TRANSURETHRAL RESECTION OF PROSTATE       Current Outpatient Medications  Medication Sig Dispense Refill  .  ALPRAZolam (XANAX) 0.5 MG tablet Take 1 tablet (0.5 mg total) by mouth 2 (two) times daily. 60 tablet 1  . DULoxetine (CYMBALTA) 30 MG capsule TAKE 1 CAPSULE(30 MG) BY MOUTH DAILY 30 capsule 1  . folic acid (FOLVITE) 1 MG tablet Take 1 mg by mouth daily.    . furosemide (LASIX) 40 MG tablet Take 20 mg by mouth daily. Take 20 mg daily and uses 40 mg daily for weight gain of 3 lbs or increased foot swelling.    . gabapentin (NEURONTIN) 600 MG tablet Take 1 tablet (600 mg total) by mouth 2 (two) times daily. 270 tablet 3  . levothyroxine (SYNTHROID) 112 MCG tablet Take 1 tablet (112 mcg total) by mouth daily. 90 tablet 0  . magnesium oxide (MAG-OX) 400 (241.3 Mg) MG tablet Take 400 mg by mouth daily.    . methocarbamol (ROBAXIN) 750 MG tablet Take 1 tablet (750 mg total) by mouth 3 (three) times daily. 90 tablet 1  . Methotrexate 2.5 MG/ML SOLN Take 0.3 mLs by mouth once a week.    Marland Kitchen oxyCODONE-acetaminophen (PERCOCET) 10-325 MG tablet Take 1.5 tablets by mouth every 6 (six) hours as needed for up to 5 days for pain (back pain). 30 tablet 0  . pravastatin (PRAVACHOL) 80 MG tablet TAKE 1 TABLET BY MOUTH IN  THE EVENING 90 tablet 2  . ranolazine (RANEXA) 500 MG 12 hr tablet TAKE 1 TABLET(500 MG) BY MOUTH TWICE DAILY 180 tablet 3  . Tetrahydrozoline-PEG (EYE DROPS EXTRA OP) Apply 1-2 drops to eye as needed.    . TRACE MINERALS CR-CU-MN-ZN IV Inject into the vein daily. Using trace minerals - No Cramps product in water.    . traZODone (DESYREL) 50 MG tablet TAKE 1 TO 2 TABLET BY MOUTH EVERY NIGHT AT BEDTIME AS  NEEDED. 180 tablet 3   No current facility-administered medications for this visit.    Allergies:   Flecainide, Morphine and related, Niaspan [niacin], Propafenone, and Zoloft [sertraline]   Social History:  The patient  reports that he has never smoked. He has never used smokeless tobacco. He reports that he does not drink alcohol and does not use drugs.   Family History:  The patient's family  history includes CAD in his brother and father; Congestive Heart Failure in his brother; Kidney Stones in his brother; Kidney failure in his brother; Leukemia in his brother; Prostate cancer in his brother; Throat cancer in his mother.   ROS:  Please see the history of present illness.   Otherwise, review of systems is positive for none.   All other systems are reviewed and negative.   PHYSICAL EXAM: VS:  BP (!) 120/56   Pulse (!) 56   Ht 5\' 10"  (1.778 m)  Wt 178 lb (80.7 kg)   SpO2 99%   BMI 25.54 kg/m  , BMI Body mass index is 25.54 kg/m. GEN: Well nourished, well developed, in no acute distress  HEENT: normal  Neck: no JVD, carotid bruits, or masses Cardiac: RRR; no murmurs, rubs, or gallops,no edema  Respiratory:  clear to auscultation bilaterally, normal work of breathing GI: soft, nontender, nondistended, + BS MS: no deformity or atrophy  Skin: warm and dry Neuro:  Strength and sensation are intact Psych: euthymic mood, full affect  EKG:  EKG is ordered today. Personal review of the ekg ordered shows sinus rhythm, rate 56  Recent Labs: 03/02/2020: Magnesium 2.1; NT-Pro BNP 1,241 04/20/2020: ALT 7; BUN 11; Creatinine, Ser 1.00; Hemoglobin 11.1; Platelets 48; Potassium 4.5; Sodium 142; TSH 5.680    Lipid Panel     Component Value Date/Time   CHOL 120 04/20/2020 0951   TRIG 115 04/20/2020 0951   HDL 26 (L) 04/20/2020 0951   CHOLHDL 4.6 04/20/2020 0951   LDLCALC 73 04/20/2020 0951     Wt Readings from Last 3 Encounters:  06/15/20 178 lb (80.7 kg)  06/05/20 178 lb 12.8 oz (81.1 kg)  04/23/20 181 lb 9.6 oz (82.4 kg)      Other studies Reviewed: Additional studies/ records that were reviewed today include: TTE 11/20/17  Review of the above records today demonstrates:  - Left ventricle: The cavity size was normal. Wall thickness was   increased in a pattern of mild LVH. Systolic function was normal.   The estimated ejection fraction was in the range of 55% to 60%.    Wall motion was normal; there were no regional wall motion   abnormalities. Features are consistent with a pseudonormal left   ventricular filling pattern, with concomitant abnormal relaxation   and increased filling pressure (grade 2 diastolic dysfunction).   Doppler parameters are consistent with high ventricular filling   pressure. - Aortic valve: There was mild stenosis. Valve area (VTI): 2.52   cm^2. Valve area (Vmax): 2.38 cm^2. Valve area (Vmean): 2.2 cm^2. - Mitral valve: There was mild regurgitation. - Left atrium: The atrium was mildly dilated. - Right atrium: The atrium was mildly dilated.  Cardiac Monitor 06/01/18 - personally reviewed The rhythm throughout his sinus with minimum average and maximum heart rates of 47, 61 and 127 bpm.  IVCD is noted.  The minimum rate is sinus bradycardia at 10:32 AM.  Ventricular ectopy, 6.1 % with frequent PVCs 309 couplets and one triplet.  There are episodes of bigeminy and trigeminy noted.  The episode of ventricular bigeminy persisted for 5 minutes and 46 seconds and trigeminy 1 minute and 34 seconds.  Supraventricular ectopy, 4.9% with rare couplets and no episodes of atrial tachycardia atrial fibrillation or flutter.  There were no bradycardic events, no episodes of sinus node or AV block or pauses of 3 seconds or greater.  There were no triggered or symptomatic events noted.  Conclusion, no significant bradycardia noted.  I suspect the episode of patient noted bradycardia with bigeminy and trigeminy with the pulse oximeter.  There are frequent PVCs and APCs that are asymptomatic.  ASSESSMENT AND PLAN:  1.  PVCs: Continue to have weakness, fatigue, and shortness of breath.  Is currently on Ranexa.  PVC burden only 6% on most recent monitor.  Since being put on his Ranexa, he is felt much better.  His heart rates have come up into the 60s most of the time.  No changes.  2.  Pseudobradycardia: Due to elevated PVCs.  Having 6% on most  recent monitor.  3.  Hypertension: Currently well controlled  4.  Hyperlipidemia: Pravastatin per primary cardiology  Current medicines are reviewed at length with the patient today.   The patient does not have concerns regarding his medicines.  The following changes were made today: None  Labs/ tests ordered today include:  Orders Placed This Encounter  Procedures  . EKG 12-Lead     Disposition:   FU with Worthington Cruzan 6 months  Signed, Cato Liburd Meredith Leeds, MD  06/15/2020 12:04 PM     [facility] [address] [place] [address] [phone] (office) [phone] (fax)

## 2020-06-16 ENCOUNTER — Other Ambulatory Visit: Payer: Self-pay

## 2020-06-16 DIAGNOSIS — L958 Other vasculitis limited to the skin: Secondary | ICD-10-CM | POA: Diagnosis not present

## 2020-06-16 DIAGNOSIS — L820 Inflamed seborrheic keratosis: Secondary | ICD-10-CM | POA: Diagnosis not present

## 2020-06-16 DIAGNOSIS — I739 Peripheral vascular disease, unspecified: Secondary | ICD-10-CM

## 2020-06-16 DIAGNOSIS — L299 Pruritus, unspecified: Secondary | ICD-10-CM | POA: Diagnosis not present

## 2020-06-30 ENCOUNTER — Encounter: Payer: Self-pay | Admitting: Vascular Surgery

## 2020-06-30 ENCOUNTER — Other Ambulatory Visit: Payer: Self-pay

## 2020-06-30 ENCOUNTER — Ambulatory Visit: Payer: Medicare Other | Admitting: Vascular Surgery

## 2020-06-30 ENCOUNTER — Ambulatory Visit (HOSPITAL_COMMUNITY)
Admission: RE | Admit: 2020-06-30 | Discharge: 2020-06-30 | Disposition: A | Discharge status: Home / Self Care | Payer: Medicare Other | Source: Ambulatory Visit | Attending: Vascular Surgery | Admitting: Vascular Surgery

## 2020-06-30 DIAGNOSIS — M79606 Pain in leg, unspecified: Secondary | ICD-10-CM | POA: Insufficient documentation

## 2020-06-30 DIAGNOSIS — I739 Peripheral vascular disease, unspecified: Secondary | ICD-10-CM | POA: Diagnosis not present

## 2020-06-30 DIAGNOSIS — M79604 Pain in right leg: Secondary | ICD-10-CM | POA: Diagnosis not present

## 2020-06-30 DIAGNOSIS — M79605 Pain in left leg: Secondary | ICD-10-CM

## 2020-06-30 NOTE — Progress Notes (Signed)
Patient name: Jesse [name] MRN: [id number] DOB: 07/04/1941 Sex: male  REASON FOR CONSULT: Evaluate for PVD  HPI: Jesse [name] is a 79 y.o. male, with history of hypertension, hyperlipidemia, ITP, CLL, CHF presents for evaluation of PVD. Patient is here with his wife who states that home health screen suggested that he had some decreased flow in the left leg. He also has noticed some pins-and-needles sensation in both feet that has been ongoing for at least a year. He states when he walks around his house or to the mailbox he does not have any pain in his legs. He has fairly limited mobility otherwise. He denies any nonhealing wounds. No pain in the feet that keeps him awake at night. Denies ever being diagnosed with neuropathy. He does take gabapentin for chronic back pain.  Past Medical History:  Diagnosis Date  . Anemia    takes Ferrous Sulfate daily  . Anxiety    takes Xanax daily  . Arthritis   . Atherosclerotic heart disease of native coronary artery without angina pectoris   . BPH (benign prostatic hyperplasia) 10/25/2017  . Bradycardia 10/25/2017  . CAD (coronary artery disease) 10/25/2017  . Chest pain 10/25/2017  . Chronic back pain   . Chronic ITP (idiopathic thrombocytopenia) (HCC) 08/08/2013  . Chronic lymphocytic leukemia ([place]) 10/25/2017  . Chronic lymphocytic leukemia of B-cell type not having achieved remission ([place])   . Chronic pain syndrome   . Constipation    takes Colace daily as needed  . Dry eyes    eye drops as needed  . Dyspnea on exertion 10/25/2017  . Essential (primary) hypertension   . History of blood transfusion    no abnormal reaction noted  . History of kidney stones   . Hyperlipemia 10/25/2017  . Hyperlipidemia    takes Pravastatin daily  . Hypertensive heart disease without heart failure   . Infusion reaction 4 yrs ago   platelet infusion broke out head to toe  . Insomnia    takes trazodone nightly as needed  . Leukemia ([place])   . Low back pain  12/12/2012  . Lumbar radiculopathy 01/09/2013  . Major depressive disorder, single episode, mild ([place])   . Muscle spasm    takes Robaxin daily as needed  . Nerve pain    takes Gabapentin daily   . Nocturia   . Opioid dependence, uncomplicated ([place])   . Other primary thrombocytopenia ([place])   . Other specified hypothyroidism   . Other specified intestinal infections   . Renal stones 10/25/2017  . Status post lumbar surgery 12/02/2015  . Thrombocytopenia, secondary 10/25/2017   Overview:  secondary to CLL  . Vitamin B12 deficiency    takes Vit B12 daily  . Weakness    numbness and tingling    Past Surgical History:  Procedure Laterality Date  . ANTERIOR LAT LUMBAR FUSION N/A 12/02/2015   Procedure: L3-4 ANTERIOR LATERAL LUMBAR FUSION w/lateral plate;  Surgeon: Eustace Moore, MD;  Location: [facility];  Service: Neurosurgery;  Laterality: N/A;  L3-4 ANTERIOR LATERAL LUMBAR FUSION w/lateral plate  . BACK SURGERY  2012   fusion x 2  . CATARACT EXTRACTION    . CERVICAL FUSION  1988   x 2  . COLONOSCOPY    . FOOT SURGERY    . HERNIA REPAIR Left    inguinal  . KNEE SURGERY    . LAMINECTOMY    . plates/screws/rods removed  2015  . PVC ABLATION N/A 04/24/2019  Procedure: PVC ABLATION;  Surgeon: Constance Haw, MD;  Location: [facility];  Service: Cardiovascular;  Laterality: N/A;  . TOE SURGERY    . TRANSURETHRAL RESECTION OF PROSTATE      Family History  Problem Relation Age of Onset  . Congestive Heart Failure Brother   . CAD Brother   . Leukemia Brother   . Prostate cancer Brother   . Kidney failure Brother   . Kidney Stones Brother   . Throat cancer Mother   . CAD Father     SOCIAL HISTORY: Social History   Socioeconomic History  . Marital status: Married    Spouse name: Not on file  . Number of children: 1  . Years of education: Not on file  . Highest education level: Not on file  Occupational History  . Not on file  Tobacco Use  . Smoking status:  Never Smoker  . Smokeless tobacco: Never Used  Vaping Use  . Vaping Use: Never used  Substance and Sexual Activity  . Alcohol use: No  . Drug use: No  . Sexual activity: Not Currently  Other Topics Concern  . Not on file  Social History Narrative  . Not on file   Social Determinants of Health   Financial Resource Strain:   . Difficulty of Paying Living Expenses: Not on file  Food Insecurity: No Food Insecurity  . Worried About Charity fundraiser in the Last Year: Never true  . Ran Out of Food in the Last Year: Never true  Transportation Needs: No Transportation Needs  . Lack of Transportation (Medical): No  . Lack of Transportation (Non-Medical): No  Physical Activity:   . Days of Exercise per Week: Not on file  . Minutes of Exercise per Session: Not on file  Stress:   . Feeling of Stress : Not on file  Social Connections:   . Frequency of Communication with Friends and Family: Not on file  . Frequency of Social Gatherings with Friends and Family: Not on file  . Attends Religious Services: Not on file  . Active Member of Clubs or Organizations: Not on file  . Attends Archivist Meetings: Not on file  . Marital Status: Not on file  Intimate Partner Violence:   . Fear of Current or Ex-Partner: Not on file  . Emotionally Abused: Not on file  . Physically Abused: Not on file  . Sexually Abused: Not on file    Allergies  Allergen Reactions  . Flecainide   . Morphine And Related Other (See Comments)    sedation  . Niaspan [Niacin] Other (See Comments)    flushing  . Propafenone Other (See Comments)    somnolence  . Zoloft [Sertraline] Other (See Comments)    Drowsiness    Current Outpatient Medications  Medication Sig Dispense Refill  . ALPRAZolam (XANAX) 0.5 MG tablet Take 1 tablet (0.5 mg total) by mouth 2 (two) times daily. 60 tablet 1  . folic acid (FOLVITE) 1 MG tablet Take 1 mg by mouth daily.    . furosemide (LASIX) 40 MG tablet Take 20 mg by  mouth daily. Take 20 mg daily and uses 40 mg daily for weight gain of 3 lbs or increased foot swelling.    . gabapentin (NEURONTIN) 600 MG tablet Take 1 tablet (600 mg total) by mouth 2 (two) times daily. 270 tablet 3  . levothyroxine (SYNTHROID) 112 MCG tablet Take 1 tablet (112 mcg total) by mouth daily. 90 tablet  0  . magnesium oxide (MAG-OX) 400 (241.3 Mg) MG tablet Take 400 mg by mouth daily.    . methocarbamol (ROBAXIN) 750 MG tablet Take 1 tablet (750 mg total) by mouth 3 (three) times daily. 90 tablet 1  . Methotrexate 2.5 MG/ML SOLN Take 0.3 mLs by mouth once a week.    . pravastatin (PRAVACHOL) 80 MG tablet TAKE 1 TABLET BY MOUTH IN  THE EVENING 90 tablet 2  . ranolazine (RANEXA) 500 MG 12 hr tablet TAKE 1 TABLET(500 MG) BY MOUTH TWICE DAILY 180 tablet 3  . Tetrahydrozoline-PEG (EYE DROPS EXTRA OP) Apply 1-2 drops to eye as needed.    . TRACE MINERALS CR-CU-MN-ZN IV Inject into the vein daily. Using trace minerals - No Cramps product in water.    . traZODone (DESYREL) 50 MG tablet TAKE 1 TO 2 TABLET BY MOUTH EVERY NIGHT AT BEDTIME AS  NEEDED. 180 tablet 3  . DULoxetine (CYMBALTA) 30 MG capsule TAKE 1 CAPSULE(30 MG) BY MOUTH DAILY 30 capsule 1   No current facility-administered medications for this visit.    REVIEW OF SYSTEMS:  [X]  denotes positive finding, [ ]  denotes negative finding Cardiac  Comments:  Chest pain or chest pressure:    Shortness of breath upon exertion:    Short of breath when lying flat:    Irregular heart rhythm:        Vascular    Pain in calf, thigh, or hip brought on by ambulation:    Pain in feet at night that wakes you up from your sleep:     Blood clot in your veins:    Leg swelling:         Pulmonary    Oxygen at home:    Productive cough:     Wheezing:         Neurologic    Sudden weakness in arms or legs:     Sudden numbness in arms or legs:     Sudden onset of difficulty speaking or slurred speech:    Temporary loss of vision in one eye:      Problems with dizziness:         Gastrointestinal    Blood in stool:     Vomited blood:         Genitourinary    Burning when urinating:     Blood in urine:        Psychiatric    Major depression:         Hematologic    Bleeding problems:    Problems with blood clotting too easily:        Skin    Rashes or ulcers:        Constitutional    Fever or chills:      PHYSICAL EXAM: Vitals:   06/30/20 1220  BP: 105/68  Pulse: (!) 57  Resp: 16  Temp: 98.2 F (36.8 C)  TempSrc: Temporal  SpO2: 95%  Weight: 177 lb (80.3 kg)  Height: 5\' 10"  (1.778 m)    GENERAL: The patient is a well-nourished male, in no acute distress. The vital signs are documented above. CARDIAC: There is a regular rate and rhythm.  VASCULAR:  Palpable femoral pulses bilaterally Palpable posterior tibial pulses bilaterally No lower extremity tissue loss PULMONARY: There is good air exchange bilaterally without wheezing or rales. ABDOMEN: Soft and non-tender. MUSCULOSKELETAL: There are no major deformities or cyanosis. NEUROLOGIC: No focal weakness or paresthesias are detected. SKIN: There are no ulcers or  rashes noted. PSYCHIATRIC: The patient has a normal affect.  DATA:   ABIs today appeared noncompressible at 1.33 in the right and 1.51 on the left but are triphasic with normal waveform at the ankle   Assessment/Plan:  79 year old male presents for evaluation of PVD after abnormal home health screen. Discussed with both him and his wife the etiology for peripheral arterial disease including claudication and progression to critical limb ischemia with tissue loss and rest pain. He essentially has no symptoms that are consistent with PAD at this time. In addition he has normal triphasic waveforms at the ankle although the ABIs are noncompressible. He also has palpable posterior tibial pulses on exam. I do not think he needs any intervention from a vascular surgery standpoint. Offered follow-up as  needed in the future.   Marty Heck, MD [facility] Office: [phone]

## 2020-07-02 ENCOUNTER — Encounter (HOSPITAL_COMMUNITY): Payer: Medicare Other

## 2020-07-02 ENCOUNTER — Encounter: Payer: Medicare Other | Admitting: Vascular Surgery

## 2020-07-04 ENCOUNTER — Other Ambulatory Visit: Payer: Self-pay | Admitting: Family Medicine

## 2020-07-06 ENCOUNTER — Other Ambulatory Visit: Payer: Self-pay | Admitting: Family Medicine

## 2020-07-07 ENCOUNTER — Other Ambulatory Visit: Payer: Self-pay | Admitting: Family Medicine

## 2020-07-10 ENCOUNTER — Telehealth: Payer: Self-pay

## 2020-07-10 ENCOUNTER — Other Ambulatory Visit: Payer: Self-pay | Admitting: Family Medicine

## 2020-07-10 ENCOUNTER — Other Ambulatory Visit: Payer: Self-pay

## 2020-07-10 MED ORDER — OXYCODONE-ACETAMINOPHEN 10-325 MG PO TABS: ORAL_TABLET | ORAL | 0 refills | Status: DC

## 2020-07-10 MED ORDER — OXYCODONE-ACETAMINOPHEN 10-325 MG PO TABS
ORAL_TABLET | ORAL | 0 refills | Status: DC
Start: 2020-07-10 — End: 2020-07-10

## 2020-07-10 NOTE — Telephone Encounter (Signed)
Sent. Kc  °

## 2020-07-20 ENCOUNTER — Other Ambulatory Visit: Payer: Self-pay | Admitting: Family Medicine

## 2020-07-20 ENCOUNTER — Other Ambulatory Visit: Payer: Self-pay | Admitting: *Deleted

## 2020-07-20 NOTE — Telephone Encounter (Signed)
Pt left a message on the refill voicemail, requesting refill for Ranexa. Will route to the [place] office to take care of.

## 2020-07-21 MED ORDER — RANOLAZINE ER 500 MG PO TB12: 500.0000 mg | ORAL_TABLET | Freq: Two times a day (BID) | ORAL | 3 refills | Status: AC

## 2020-07-24 NOTE — Progress Notes (Signed)
Established Patient Office Visit  Subjective:  Patient ID: Jesse [name], male    DOB: 1940-11-13  Age: 79 y.o. MRN: [id number]  CC:  Chief Complaint  Patient presents with  . Hyperlipidemia  . Hypothyroidism    HPI Jesse [name] presents for chronic follow up.  Pt presents with hyperlipidemia.  On pravachol.  He eats a low cholesterol diet. Unable to exercise.    Other specified hypothyroidism details; date of diagnosis 2014.  He is taking synthroid 112 mcg once daily.   His tsh was not at goal 3 months ago. He denies any related symptoms.      Concerning atherosclerotic heart disease of native coronary artery without angina pectoris, he is here today for routine follow-up.  Currently, his treatment regimen consists of a ranexa, diuretic ( furosemide ) and Pravachol.  Takes lasix occasionally.He denies chest pain and lower extremity edema.  Jesse [name] is compliant with low fat diet, low salt diet, tobacco avoidance, and taking medications as recommended and prescribed.      Jesse [name] presents with a diagnosis of chronic lymphocytic leukemia of B-cell type in remission.  He has persistent thrombocytopenia. The course has been stable and nonprogressive.  Sees Dr. Lissa Merlin.  Chronic pain syndrome due to low back pain. His pain is 8/10.  He is taking percocet, robaxin.   GAD: Did not continue taking duloxetine. Currently taking xanax twice a day which is down from qid when he started coming to me. Denies depression. His family can tell when he misses a xanax.    Past Medical History:  Diagnosis Date  . Anemia    takes Ferrous Sulfate daily  . Anxiety    takes Xanax daily  . Arthritis   . Atherosclerotic heart disease of native coronary artery without angina pectoris   . BPH (benign prostatic hyperplasia) 10/25/2017  . Bradycardia 10/25/2017  . CAD (coronary artery disease) 10/25/2017  . Chest pain 10/25/2017  . Chronic back pain   . Chronic ITP (idiopathic thrombocytopenia) (HCC) 08/08/2013  .  Chronic lymphocytic leukemia ([place]) 10/25/2017  . Chronic lymphocytic leukemia of B-cell type not having achieved remission ([place])   . Chronic pain syndrome   . Constipation    takes Colace daily as needed  . Dry eyes    eye drops as needed  . Dyspnea on exertion 10/25/2017  . Essential (primary) hypertension   . History of blood transfusion    no abnormal reaction noted  . History of kidney stones   . Hyperlipemia 10/25/2017  . Hyperlipidemia    takes Pravastatin daily  . Hypertensive heart disease without heart failure   . Infusion reaction 4 yrs ago   platelet infusion broke out head to toe  . Insomnia    takes trazodone nightly as needed  . Leukemia ([place])   . Low back pain 12/12/2012  . Lumbar radiculopathy 01/09/2013  . Major depressive disorder, single episode, mild ([place])   . Muscle spasm    takes Robaxin daily as needed  . Nerve pain    takes Gabapentin daily   . Nocturia   . Opioid dependence, uncomplicated ([place])   . Other primary thrombocytopenia ([place])   . Other specified hypothyroidism   . Other specified intestinal infections   . Renal stones 10/25/2017  . Status post lumbar surgery 12/02/2015  . Thrombocytopenia, secondary 10/25/2017   Overview:  secondary to CLL  . Vitamin B12 deficiency    takes Vit B12 daily  . Weakness    numbness and  tingling    Past Surgical History:  Procedure Laterality Date  . ANTERIOR LAT LUMBAR FUSION N/A 12/02/2015   Procedure: L3-4 ANTERIOR LATERAL LUMBAR FUSION w/lateral plate;  Surgeon: Eustace Moore, MD;  Location: [facility];  Service: Neurosurgery;  Laterality: N/A;  L3-4 ANTERIOR LATERAL LUMBAR FUSION w/lateral plate  . BACK SURGERY  2012   fusion x 2  . CATARACT EXTRACTION    . CERVICAL FUSION  1988   x 2  . COLONOSCOPY    . FOOT SURGERY    . HERNIA REPAIR Left    inguinal  . KNEE SURGERY    . LAMINECTOMY    . plates/screws/rods removed  2015  . PVC ABLATION N/A 04/24/2019   Procedure: PVC ABLATION;  Surgeon: Constance Haw, MD;  Location: [facility];  Service: Cardiovascular;  Laterality: N/A;  . TOE SURGERY    . TRANSURETHRAL RESECTION OF PROSTATE      Family History  Problem Relation Age of Onset  . Congestive Heart Failure Brother   . CAD Brother   . Leukemia Brother   . Prostate cancer Brother   . Kidney failure Brother   . Kidney Stones Brother   . Throat cancer Mother   . CAD Father     Social History   Socioeconomic History  . Marital status: Married    Spouse name: Not on file  . Number of children: 1  . Years of education: Not on file  . Highest education level: Not on file  Occupational History  . Not on file  Tobacco Use  . Smoking status: Never Smoker  . Smokeless tobacco: Never Used  Vaping Use  . Vaping Use: Never used  Substance and Sexual Activity  . Alcohol use: No  . Drug use: No  . Sexual activity: Not Currently  Other Topics Concern  . Not on file  Social History Narrative  . Not on file   Social Determinants of Health   Financial Resource Strain:   . Difficulty of Paying Living Expenses: Not on file  Food Insecurity: No Food Insecurity  . Worried About Charity fundraiser in the Last Year: Never true  . Ran Out of Food in the Last Year: Never true  Transportation Needs: No Transportation Needs  . Lack of Transportation (Medical): No  . Lack of Transportation (Non-Medical): No  Physical Activity:   . Days of Exercise per Week: Not on file  . Minutes of Exercise per Session: Not on file  Stress:   . Feeling of Stress : Not on file  Social Connections:   . Frequency of Communication with Friends and Family: Not on file  . Frequency of Social Gatherings with Friends and Family: Not on file  . Attends Religious Services: Not on file  . Active Member of Clubs or Organizations: Not on file  . Attends Archivist Meetings: Not on file  . Marital Status: Not on file  Intimate Partner Violence:   . Fear of Current or Ex-Partner: Not on  file  . Emotionally Abused: Not on file  . Physically Abused: Not on file  . Sexually Abused: Not on file    Outpatient Medications Prior to Visit  Medication Sig Dispense Refill  . ALPRAZolam (XANAX) 0.5 MG tablet Take 1 tablet (0.5 mg total) by mouth 2 (two) times daily. 60 tablet 1  . DULoxetine (CYMBALTA) 30 MG capsule TAKE 1 CAPSULE(30 MG) BY MOUTH DAILY 30 capsule 1  .  folic acid (FOLVITE) 1 MG tablet Take 1 mg by mouth daily.    . furosemide (LASIX) 40 MG tablet Take 20 mg by mouth daily. Take 20 mg daily and uses 40 mg daily for weight gain of 3 lbs or increased foot swelling.    . gabapentin (NEURONTIN) 600 MG tablet Take 1 tablet (600 mg total) by mouth 2 (two) times daily. (Patient taking differently: Take 600 mg by mouth 3 (three) times daily. ) 270 tablet 3  . levothyroxine (SYNTHROID) 112 MCG tablet TAKE 1 TABLET(112 MCG) BY MOUTH DAILY 90 tablet 0  . magnesium oxide (MAG-OX) 400 (241.3 Mg) MG tablet Take 400 mg by mouth daily.    . methocarbamol (ROBAXIN) 750 MG tablet TAKE 1 TABLET BY MOUTH 3  TIMES DAILY 90 tablet 1  . Methotrexate 2.5 MG/ML SOLN Take 0.3 mLs by mouth once a week.    Marland Kitchen oxyCODONE-acetaminophen (PERCOCET) 10-325 MG tablet 1 1/2 tablets four times a day. 180 tablet 0  . pravastatin (PRAVACHOL) 80 MG tablet TAKE 1 TABLET BY MOUTH IN  THE EVENING 90 tablet 2  . ranolazine (RANEXA) 500 MG 12 hr tablet Take 1 tablet (500 mg total) by mouth 2 (two) times daily. 180 tablet 3  . Tetrahydrozoline-PEG (EYE DROPS EXTRA OP) Apply 1-2 drops to eye as needed.    . traZODone (DESYREL) 50 MG tablet TAKE 1 TO 2 TABLET BY MOUTH EVERY NIGHT AT BEDTIME AS  NEEDED. (Patient not taking: Reported on 07/27/2020) 180 tablet 3  . levothyroxine (SYNTHROID) 100 MCG tablet TAKE 1 TABLET(100 MCG) BY MOUTH DAILY BEFORE BREAKFAST 30 tablet 1  . TRACE MINERALS [id number] IV Inject into the vein daily. Using trace minerals - No Cramps product in water.     No facility-administered medications  prior to visit.    Allergies  Allergen Reactions  . Flecainide   . Morphine And Related Other (See Comments)    sedation  . Niaspan [Niacin] Other (See Comments)    flushing  . Propafenone Other (See Comments)    somnolence  . Zoloft [Sertraline] Other (See Comments)    Drowsiness    ROS Review of Systems  Constitutional: Positive for fatigue. Negative for chills and fever.  HENT: Negative for congestion, ear pain and sore throat.   Respiratory: Negative for cough and shortness of breath.   Cardiovascular: Positive for leg swelling (L>R. sometimes. ). Negative for chest pain and palpitations.  Gastrointestinal: Negative for abdominal pain, constipation, diarrhea, nausea and vomiting.  Endocrine: Negative for polydipsia and polyphagia.  Genitourinary: Negative for dysuria and urgency.  Musculoskeletal: Positive for back pain (Chronic. severe. 8/10.).  Neurological: Positive for light-headedness. Negative for dizziness and headaches.  Psychiatric/Behavioral: Negative for dysphoric mood. The patient is not nervous/anxious.        Anhedonia      Objective:    Physical Exam Vitals reviewed.  Constitutional:      Appearance: Normal appearance. He is well-developed.  Neck:     Vascular: No carotid bruit.  Cardiovascular:     Rate and Rhythm: Normal rate and regular rhythm.     Heart sounds: Murmur heard.   Pulmonary:     Effort: Pulmonary effort is normal.     Breath sounds: Normal breath sounds.  Abdominal:     General: Bowel sounds are normal.     Palpations: Abdomen is soft.     Tenderness: There is no abdominal tenderness.  Musculoskeletal:        General: Tenderness (lumbar  back. large scar midline from upper lumbar to sacrum.) present.     Right lower leg: Edema (trace) present.     Left lower leg: Edema (1 +) present.  Skin:    Findings: No rash.  Neurological:     Mental Status: He is alert and oriented to person, place, and time.  Psychiatric:        Mood  and Affect: Mood normal.        Behavior: Behavior normal.    BP 124/62   Pulse 64   Temp 98 F (36.7 C)   Resp 16   Ht 5\' 10"  (1.778 m)   Wt 180 lb 9.6 oz (81.9 kg)   BMI 25.91 kg/m  Wt Readings from Last 3 Encounters:  07/27/20 180 lb 9.6 oz (81.9 kg)  06/30/20 177 lb (80.3 kg)  06/15/20 178 lb (80.7 kg)     Health Maintenance Due  Topic Date Due  . TETANUS/TDAP  Never done    There are no preventive care reminders to display for this patient.  Lab Results  Component Value Date   TSH 1.840 07/27/2020   Lab Results  Component Value Date   WBC 2.7 (L) 07/27/2020   HGB 10.8 (L) 07/27/2020   HCT 32.2 (L) 07/27/2020   MCV 92 07/27/2020   PLT 48 (LL) 07/27/2020   Lab Results  Component Value Date   NA 140 07/27/2020   K 5.0 07/27/2020   CO2 28 07/27/2020   GLUCOSE 109 (H) 07/27/2020   BUN 12 07/27/2020   CREATININE 1.00 07/27/2020   BILITOT 0.6 07/27/2020   ALKPHOS 99 07/27/2020   AST 25 07/27/2020   ALT 7 07/27/2020   PROT 5.0 (L) 07/27/2020   ALBUMIN 3.7 07/27/2020   CALCIUM 8.4 (L) 07/27/2020   ANIONGAP 6 09/22/2018   Lab Results  Component Value Date   CHOL 121 07/27/2020   Lab Results  Component Value Date   HDL 25 (L) 07/27/2020   Lab Results  Component Value Date   LDLCALC 75 07/27/2020   Lab Results  Component Value Date   TRIG 111 07/27/2020   Lab Results  Component Value Date   CHOLHDL 4.8 07/27/2020   No results found for: HGBA1C    Assessment & Plan:  1. Mixed hyperlipidemia Well controlled.  No changes to medicines.  Continue to work on eating a healthy diet and exercise.  Labs drawn today.  - CBC with Differential/Platelet - Comprehensive metabolic panel - Lipid panel - Cardiovascular Risk Assessment  2. Acquired hypothyroidism - TSH  3. Chronic lymphocytic leukemia ([place]) Management per specialist.  - CBC with differential/platelet.  4. Coronary artery disease of native artery of native heart with stable angina  pectoris ([place]) Well controlled. Continue ranexa. No changes to medicines.  Continue to work on eating a healthy diet and exercise.  Labs drawn today.   5. Thrombocytopenia, secondary Stable. Mgmt by hematology.   6. Chronic pain syndrome The current medical regimen is effective;  continue present plan and medications. Decrease gabapentin due to edema.  7. Uncomplicated opioid dependence ([place]) The current medical regimen is effective;  continue present plan and medications.  8. Chronic diastolic heart failure ([place]) The current medical regimen is effective;  continue present plan and medications. DECREASE GABAPENTIN 800 MG ONE TWICE A DAY to decrease swelling.   9. Vasculitis determined by biopsy of skin (HCC) Continue methotrexate and folate..  10. GAD (generalized anxiety disorder) Start on  busPIRone (BUSPAR) 10 MG tablet;  Take 1 tablet (10 mg total) by mouth 3 (three) times daily.  Dispense: 180 tablet; Refill: 0. Continue xanax at current dose, but recommended pt try to wean if possible.   11. Need for immunization against influenza - Flu Vaccine QUAD High Dose(Fluad)  My nursing staff have aided in the documentation of this note on the behalf of Rochel Brome, MD,as directed by  Rochel Brome, MD and thoroughly reviewed by Rochel Brome, MD.  Follow-up: Return in about 3 months (around 10/27/2020) for fasting. NEEDS AWV. SEE BELOW.Rochel Brome, MD

## 2020-07-27 ENCOUNTER — Encounter: Payer: Self-pay | Admitting: Family Medicine

## 2020-07-27 ENCOUNTER — Ambulatory Visit (INDEPENDENT_AMBULATORY_CARE_PROVIDER_SITE_OTHER): Payer: Medicare Other | Admitting: Family Medicine

## 2020-07-27 ENCOUNTER — Other Ambulatory Visit: Payer: Self-pay

## 2020-07-27 VITALS — BP 124/62 | HR 64 | Temp 98.0°F | Resp 16 | Ht 70.0 in | Wt 180.6 lb

## 2020-07-27 DIAGNOSIS — E039 Hypothyroidism, unspecified: Secondary | ICD-10-CM | POA: Diagnosis not present

## 2020-07-27 DIAGNOSIS — E782 Mixed hyperlipidemia: Secondary | ICD-10-CM

## 2020-07-27 DIAGNOSIS — Z23 Encounter for immunization: Secondary | ICD-10-CM | POA: Diagnosis not present

## 2020-07-27 DIAGNOSIS — C9110 Chronic lymphocytic leukemia of B-cell type not having achieved remission: Secondary | ICD-10-CM | POA: Diagnosis not present

## 2020-07-27 DIAGNOSIS — I25118 Atherosclerotic heart disease of native coronary artery with other forms of angina pectoris: Secondary | ICD-10-CM

## 2020-07-27 DIAGNOSIS — I5032 Chronic diastolic (congestive) heart failure: Secondary | ICD-10-CM

## 2020-07-27 DIAGNOSIS — D6959 Other secondary thrombocytopenia: Secondary | ICD-10-CM

## 2020-07-27 DIAGNOSIS — F411 Generalized anxiety disorder: Secondary | ICD-10-CM

## 2020-07-27 DIAGNOSIS — F1120 Opioid dependence, uncomplicated: Secondary | ICD-10-CM

## 2020-07-27 DIAGNOSIS — I776 Arteritis, unspecified: Secondary | ICD-10-CM

## 2020-07-27 DIAGNOSIS — G894 Chronic pain syndrome: Secondary | ICD-10-CM

## 2020-07-27 MED ORDER — BUSPIRONE HCL 10 MG PO TABS: 10.0000 mg | ORAL_TABLET | Freq: Three times a day (TID) | ORAL | 0 refills | Status: AC

## 2020-07-27 NOTE — Patient Instructions (Addendum)
HYPOTHYROIDISM: CHECK WHICH DOSE YOU ARE TAKING OF SYNTHROID 100 OR 112 MCG DAILY.   SWELLING: DECREASE GABAPENTIN 800 MG ONE TWICE A DAY.   ANXIETY: START BUSPIRONE 10 MG ONE THREE TIMES A DAY.  MAY CONTINUE XANAX TWICE A DAY AS NEEDED   Generalized Anxiety Disorder, Adult Generalized anxiety disorder (GAD) is a mental health disorder. People with this condition constantly worry about everyday events. Unlike normal anxiety, worry related to GAD is not triggered by a specific event. These worries also do not fade or get better with time. GAD interferes with life functions, including relationships, work, and school. GAD can vary from mild to severe. People with severe GAD can have intense waves of anxiety with physical symptoms (panic attacks). What are the causes? The exact cause of GAD is not known. What increases the risk? This condition is more likely to develop in:  Women.  People who have a family history of anxiety disorders.  People who are very shy.  People who experience very stressful life events, such as the death of a loved one.  People who have a very stressful family environment. What are the signs or symptoms? People with GAD often worry excessively about many things in their lives, such as their health and family. They may also be overly concerned about:  Doing well at work.  Being on time.  Natural disasters.  Friendships. Physical symptoms of GAD include:  Fatigue.  Muscle tension or having muscle twitches.  Trembling or feeling shaky.  Being easily startled.  Feeling like your heart is pounding or racing.  Feeling out of breath or like you cannot take a deep breath.  Having trouble falling asleep or staying asleep.  Sweating.  Nausea, diarrhea, or irritable bowel syndrome (IBS).  Headaches.  Trouble concentrating or remembering facts.  Restlessness.  Irritability. How is this diagnosed? Your health care provider can diagnose GAD  based on your symptoms and medical history. You will also have a physical exam. The health care provider will ask specific questions about your symptoms, including how severe they are, when they started, and if they come and go. Your health care provider may ask you about your use of alcohol or drugs, including prescription medicines. Your health care provider may refer you to a mental health specialist for further evaluation. Your health care provider will do a thorough examination and may perform additional tests to rule out other possible causes of your symptoms. To be diagnosed with GAD, a person must have anxiety that:  Is out of his or her control.  Affects several different aspects of his or her life, such as work and relationships.  Causes distress that makes him or her unable to take part in normal activities.  Includes at least three physical symptoms of GAD, such as restlessness, fatigue, trouble concentrating, irritability, muscle tension, or sleep problems. Before your health care provider can confirm a diagnosis of GAD, these symptoms must be present more days than they are not, and they must last for six months or longer. How is this treated? The following therapies are usually used to treat GAD:  Medicine. Antidepressant medicine is usually prescribed for long-term daily control. Antianxiety medicines may be added in severe cases, especially when panic attacks occur.  Talk therapy (psychotherapy). Certain types of talk therapy can be helpful in treating GAD by providing support, education, and guidance. Options include: ? Cognitive behavioral therapy (CBT). People learn coping skills and techniques to ease their anxiety. They learn to identify  unrealistic or negative thoughts and behaviors and to replace them with positive ones. ? Acceptance and commitment therapy (ACT). This treatment teaches people how to be mindful as a way to cope with unwanted thoughts and  feelings. ? Biofeedback. This process trains you to manage your body's response (physiological response) through breathing techniques and relaxation methods. You will work with a therapist while machines are used to monitor your physical symptoms.  Stress management techniques. These include yoga, meditation, and exercise. A mental health specialist can help determine which treatment is best for you. Some people see improvement with one type of therapy. However, other people require a combination of therapies. Follow these instructions at home:  Take over-the-counter and prescription medicines only as told by your health care provider.  Try to maintain a normal routine.  Try to anticipate stressful situations and allow extra time to manage them.  Practice any stress management or self-calming techniques as taught by your health care provider.  Do not punish yourself for setbacks or for not making progress.  Try to recognize your accomplishments, even if they are small.  Keep all follow-up visits as told by your health care provider. This is important. Contact a health care provider if:  Your symptoms do not get better.  Your symptoms get worse.  You have signs of depression, such as: ? A persistently sad, cranky, or irritable mood. ? Loss of enjoyment in activities that used to bring you joy. ? Change in weight or eating. ? Changes in sleeping habits. ? Avoiding friends or family members. ? Loss of energy for normal tasks. ? Feelings of guilt or worthlessness. Get help right away if:  You have serious thoughts about hurting yourself or others. If you ever feel like you may hurt yourself or others, or have thoughts about taking your own life, get help right away. You can go to your nearest emergency department or call:  Your local emergency services (911 in the [place]  A suicide crisis helpline, such as the [facility] at 234-639-3638. This is open  24 hours a day. Summary  Generalized anxiety disorder (GAD) is a mental health disorder that involves worry that is not triggered by a specific event.  People with GAD often worry excessively about many things in their lives, such as their health and family.  GAD may cause physical symptoms such as restlessness, trouble concentrating, sleep problems, frequent sweating, nausea, diarrhea, headaches, and trembling or muscle twitching.  A mental health specialist can help determine which treatment is best for you. Some people see improvement with one type of therapy. However, other people require a combination of therapies. This information is not intended to replace advice given to you by your health care provider. Make sure you discuss any questions you have with your health care provider. Document Revised: 08/18/2017 Document Reviewed: 07/26/2016 Elsevier Patient Education  2020 [employer].

## 2020-07-28 LAB — CARDIOVASCULAR RISK ASSESSMENT

## 2020-07-28 LAB — CBC WITH DIFFERENTIAL/PLATELET
Basophils Absolute: 0 10*3/uL (ref 0.0–0.2)
Basos: 1 %
EOS (ABSOLUTE): 0.2 10*3/uL (ref 0.0–0.4)
Eos: 6 %
Hematocrit: 32.2 % — ABNORMAL LOW (ref 37.5–51.0)
Hemoglobin: 10.8 g/dL — ABNORMAL LOW (ref 13.0–17.7)
Immature Grans (Abs): 0 10*3/uL (ref 0.0–0.1)
Immature Granulocytes: 1 %
Lymphocytes Absolute: 0.7 10*3/uL (ref 0.7–3.1)
Lymphs: 26 %
MCH: 30.8 pg (ref 26.6–33.0)
MCHC: 33.5 g/dL (ref 31.5–35.7)
MCV: 92 fL (ref 79–97)
Monocytes Absolute: 0.3 10*3/uL (ref 0.1–0.9)
Monocytes: 10 %
Neutrophils Absolute: 1.5 10*3/uL (ref 1.4–7.0)
Neutrophils: 56 %
Platelets: 48 10*3/uL — CL (ref 150–450)
RBC: 3.51 x10E6/uL — ABNORMAL LOW (ref 4.14–5.80)
RDW: 14.4 % (ref 11.6–15.4)
WBC: 2.7 10*3/uL — ABNORMAL LOW (ref 3.4–10.8)

## 2020-07-28 LAB — COMPREHENSIVE METABOLIC PANEL
ALT: 7 IU/L (ref 0–44)
AST: 25 IU/L (ref 0–40)
Albumin/Globulin Ratio: 2.8 — ABNORMAL HIGH (ref 1.2–2.2)
Albumin: 3.7 g/dL (ref 3.6–4.6)
Alkaline Phosphatase: 99 IU/L (ref 44–121)
BUN/Creatinine Ratio: 12 (ref 10–24)
BUN: 12 mg/dL (ref 8–27)
Bilirubin Total: 0.6 mg/dL (ref 0.0–1.2)
CO2: 28 mmol/L (ref 20–29)
Calcium: 8.4 mg/dL — ABNORMAL LOW (ref 8.6–10.2)
Chloride: 104 mmol/L (ref 96–106)
Creatinine, Ser: 1 mg/dL (ref 0.76–1.27)
GFR calc Af Amer: 81 mL/min/{1.73_m2} (ref >59)
GFR calc non Af Amer: 70 mL/min/{1.73_m2} (ref >59)
Globulin, Total: 1.3 g/dL — ABNORMAL LOW (ref 1.5–4.5)
Glucose: 109 mg/dL — ABNORMAL HIGH (ref 65–99)
Potassium: 5 mmol/L (ref 3.5–5.2)
Sodium: 140 mmol/L (ref 134–144)
Total Protein: 5 g/dL — ABNORMAL LOW (ref 6.0–8.5)

## 2020-07-28 LAB — LIPID PANEL
Chol/HDL Ratio: 4.8 ratio (ref 0.0–5.0)
Cholesterol, Total: 121 mg/dL (ref 100–199)
HDL: 25 mg/dL — ABNORMAL LOW (ref >39)
LDL Chol Calc (NIH): 75 mg/dL (ref 0–99)
Triglycerides: 111 mg/dL (ref 0–149)
VLDL Cholesterol Cal: 21 mg/dL (ref 5–40)

## 2020-07-28 LAB — TSH: TSH: 1.84 u[IU]/mL (ref 0.450–4.500)

## 2020-08-02 ENCOUNTER — Encounter: Payer: Self-pay | Admitting: Family Medicine

## 2020-08-02 ENCOUNTER — Other Ambulatory Visit: Payer: Self-pay | Admitting: Family Medicine

## 2020-08-05 ENCOUNTER — Other Ambulatory Visit: Payer: Self-pay

## 2020-08-05 MED ORDER — LEVOTHYROXINE SODIUM 112 MCG PO TABS: 112.0000 ug | ORAL_TABLET | Freq: Every day | ORAL | 0 refills | Status: AC

## 2020-08-07 ENCOUNTER — Telehealth: Payer: Self-pay

## 2020-08-07 NOTE — Progress Notes (Addendum)
Chronic Care Management Pharmacy Assistant   Name: Jesse [name]  MRN: [id number] DOB: 09-28-1940  Reason for [place] Call.   PCP : Jesse Brome, MD  Allergies:   Allergies  Allergen Reactions   Flecainide    Morphine And Related Other (See Comments)    sedation   Niaspan [Niacin] Other (See Comments)    flushing   Propafenone Other (See Comments)    somnolence   Zoloft [Sertraline] Other (See Comments)    Drowsiness    Medications: Outpatient Encounter Medications as of 08/07/2020  Medication Sig   ALPRAZolam (XANAX) 0.5 MG tablet Take 1 tablet (0.5 mg total) by mouth 2 (two) times daily.   busPIRone (BUSPAR) 10 MG tablet Take 1 tablet (10 mg total) by mouth 3 (three) times daily.   folic acid (FOLVITE) 1 MG tablet Take 1 mg by mouth daily.   furosemide (LASIX) 40 MG tablet Take 20 mg by mouth daily. Take 20 mg daily and uses 40 mg daily for weight gain of 3 lbs or increased foot swelling.   gabapentin (NEURONTIN) 600 MG tablet Take 1 tablet (600 mg total) by mouth 2 (two) times daily. (Patient taking differently: Take 600 mg by mouth 3 (three) times daily. )   levothyroxine (SYNTHROID) 112 MCG tablet Take 1 tablet (112 mcg total) by mouth daily before breakfast.   magnesium oxide (MAG-OX) 400 (241.3 Mg) MG tablet Take 400 mg by mouth daily.   methocarbamol (ROBAXIN) 750 MG tablet TAKE 1 TABLET BY MOUTH 3  TIMES DAILY   Methotrexate 2.5 MG/ML SOLN Take 0.3 mLs by mouth once a week.   oxyCODONE-acetaminophen (PERCOCET) 10-325 MG tablet 1 1/2 tablets four times a day.   pravastatin (PRAVACHOL) 80 MG tablet TAKE 1 TABLET BY MOUTH IN  THE EVENING   ranolazine (RANEXA) 500 MG 12 hr tablet Take 1 tablet (500 mg total) by mouth 2 (two) times daily.   Tetrahydrozoline-PEG (EYE DROPS EXTRA OP) Apply 1-2 drops to eye as needed.   No facility-administered encounter medications on file as of 08/07/2020.    Current Diagnosis: Patient Active Problem  List   Diagnosis Date Noted   Leg pain 06/30/2020   Sick sinus syndrome ([place]) 01/07/2020   Acquired hypothyroidism 33/29/5188   Uncomplicated opioid dependence ([place]) 01/07/2020   PVC's (premature ventricular contractions) 06/20/2018   Postoperative examination 41/66/0630   Umbilical hernia without obstruction and without gangrene 16/09/930   Umbilical pain 35/57/3220   Mild aortic stenosis 12/12/2017   Chronic diastolic heart failure ([place]) 11/14/2017   Elevated brain natriuretic peptide (BNP) level 11/14/2017   Edema 11/14/2017   Vasculitis determined by biopsy of skin ([place]) 10/26/2017   Anxiety 10/25/2017   Chronic lymphocytic leukemia ([place]) 10/25/2017   Hyperlipemia 10/25/2017   Thrombocytopenia, secondary 10/25/2017   Bradycardia 10/25/2017   Dyspnea on exertion 10/25/2017   CAD (coronary artery disease) 10/25/2017   BPH (benign prostatic hyperplasia) 10/25/2017   Renal stones 10/25/2017   Status post lumbar surgery 12/02/2015   Lumbar radiculopathy 01/09/2013   Low back pain 12/12/2012    Follow-Up:  Pharmacist Review   08/07/2020 Name: Jesse [name] MRN: [id number] DOB: June 02, 1941 Jesse [name] is a 79 y.o. year old male who is a primary care patient of [name], Kirsten, MD.  Comprehensive medication review performed; Spoke to patient regarding cholesterol  Lipid Panel    Component Value Date/Time   CHOL 121 07/27/2020 1429   TRIG 111 07/27/2020 1429   HDL 25 (L) 07/27/2020 1429  [place] 75 07/27/2020 1429    10-year ASCVD risk score: The ASCVD Risk score Jesse Bussing DC Jr., et al., 2013) failed to calculate for the following reasons:   The 2013 ASCVD risk score is only valid for ages 15 to 60  Current antihyperlipidemic regimen:  Pravastatin 80 mg every evening Previous antihyperlipidemic medications tried: N/A ASCVD risk enhancing conditions: CHF    Tobacco Use: Low Risk    Smoking Tobacco Use: Never Smoker   Smokeless Tobacco Use: Never Used   What recent  interventions/DTPs have been made by any provider to improve Cholesterol control since last CPP Visit: None ID  Any recent hospitalizations or ED visits since last visit with CPP? No What diet changes have been made to improve Cholesterol?   Patient states he eats a low cholesterol diet. What exercise is being done to improve Cholesterol?  Patient reports he does not exercise due to his back and barely walking.   Adherence Review: Does the patient have >5 day gap between last estimated fill dates? Yes  [facility] Pharmacist Assistant [phone]   Maryjean Ka

## 2020-08-10 ENCOUNTER — Other Ambulatory Visit: Payer: Self-pay

## 2020-08-10 MED ORDER — ALPRAZOLAM 0.5 MG PO TABS
0.5000 mg | ORAL_TABLET | Freq: Two times a day (BID) | ORAL | 1 refills | Status: DC
Start: 2020-08-10 — End: 2020-09-01

## 2020-08-10 MED ORDER — OXYCODONE-ACETAMINOPHEN 10-325 MG PO TABS: ORAL_TABLET | ORAL | 0 refills | Status: DC

## 2020-08-17 ENCOUNTER — Encounter: Payer: Self-pay | Admitting: Family Medicine

## 2020-08-17 ENCOUNTER — Telehealth (INDEPENDENT_AMBULATORY_CARE_PROVIDER_SITE_OTHER): Payer: Medicare Other | Admitting: Family Medicine

## 2020-08-17 VITALS — BP 138/72 | HR 72 | Temp 101.0°F

## 2020-08-17 DIAGNOSIS — M791 Myalgia, unspecified site: Secondary | ICD-10-CM

## 2020-08-17 DIAGNOSIS — R509 Fever, unspecified: Secondary | ICD-10-CM

## 2020-08-17 DIAGNOSIS — J111 Influenza due to unidentified influenza virus with other respiratory manifestations: Secondary | ICD-10-CM

## 2020-08-17 LAB — POCT URINALYSIS DIP (CLINITEK)
Bilirubin, UA: NEGATIVE
Blood, UA: NEGATIVE
Glucose, UA: NEGATIVE mg/dL
Ketones, POC UA: NEGATIVE mg/dL
Leukocytes, UA: NEGATIVE
Nitrite, UA: NEGATIVE
POC PROTEIN,UA: NEGATIVE
Spec Grav, UA: 1.015 (ref 1.010–1.025)
Urobilinogen, UA: 0.2 E.U./dL
pH, UA: 6 (ref 5.0–8.0)

## 2020-08-17 LAB — POCT INFLUENZA A/B
Influenza A, POC: NEGATIVE
Influenza B, POC: NEGATIVE

## 2020-08-17 LAB — POC COVID19 BINAXNOW: SARS Coronavirus 2 Ag: NEGATIVE

## 2020-08-17 MED ORDER — OSELTAMIVIR PHOSPHATE 75 MG PO CAPS: 75.0000 mg | ORAL_CAPSULE | Freq: Two times a day (BID) | ORAL | 0 refills | Status: AC

## 2020-08-17 NOTE — Progress Notes (Signed)
Virtual Visit via Telephone Note   This visit type was conducted due to national recommendations for restrictions regarding the COVID-19 Pandemic (e.g. social distancing) in an effort to limit this patient's exposure and mitigate transmission in our community.  Due to his co-morbid illnesses, this patient is at least at moderate risk for complications without adequate follow up.  This format is felt to be most appropriate for this patient at this time.  The patient did not have access to video technology/had technical difficulties with video requiring transitioning to audio format only (telephone).  All issues noted in this document were discussed and addressed.  No physical exam could be performed with this format.  Patient verbally consented to a telehealth visit.   Date:  08/17/2020   ID:  Jesse [name], DOB 1941-02-18, MRN [id number]  Patient Location: Home Provider Location: Office/Clinic  PCP:  Rochel Brome, MD   Evaluation Performed: acute visit initially started as a televisit performed on the phone and then converted following a negative rapid COVID-19 and negative rapid influenza A/B.  Chief Complaint:  fever  History of Present Illness:    Jesse [name] is a 79 y.o. male complaining of fever 101, chills, sweats, coughing a little, achy all over since yesterday. No dysuria. Worsening headaches. No chest pain, sob.  Patient has had increased urination in the last few hours.  He is also taken Lasix.  He denies shortness of breath or chest pain.  His wife says he has been mildly confused intermittently.  Had covid 1,2, and booster.  Had his flu shot.   The patient does have symptoms concerning for COVID-19 infection (fever, chills, cough, or new shortness of breath).    Past Medical History:  Diagnosis Date  . Anemia    takes Ferrous Sulfate daily  . Anxiety    takes Xanax daily  . Arthritis   . Atherosclerotic heart disease of native coronary artery without angina pectoris    . BPH (benign prostatic hyperplasia) 10/25/2017  . Bradycardia 10/25/2017  . CAD (coronary artery disease) 10/25/2017  . Chest pain 10/25/2017  . Chronic back pain   . Chronic ITP (idiopathic thrombocytopenia) (HCC) 08/08/2013  . Chronic lymphocytic leukemia ([place]) 10/25/2017  . Chronic lymphocytic leukemia of B-cell type not having achieved remission ([place])   . Chronic pain syndrome   . Constipation    takes Colace daily as needed  . Dry eyes    eye drops as needed  . Dyspnea on exertion 10/25/2017  . Essential (primary) hypertension   . History of blood transfusion    no abnormal reaction noted  . History of kidney stones   . Hyperlipemia 10/25/2017  . Hyperlipidemia    takes Pravastatin daily  . Hypertensive heart disease without heart failure   . Infusion reaction 4 yrs ago   platelet infusion broke out head to toe  . Insomnia    takes trazodone nightly as needed  . Leukemia ([place])   . Low back pain 12/12/2012  . Lumbar radiculopathy 01/09/2013  . Major depressive disorder, single episode, mild ([place])   . Muscle spasm    takes Robaxin daily as needed  . Nerve pain    takes Gabapentin daily   . Nocturia   . Opioid dependence, uncomplicated ([place])   . Other primary thrombocytopenia ([place])   . Other specified hypothyroidism   . Other specified intestinal infections   . Renal stones 10/25/2017  . Status post lumbar surgery 12/02/2015  . Thrombocytopenia, secondary 10/25/2017  Overview:  secondary to CLL  . Vitamin B12 deficiency    takes Vit B12 daily  . Weakness    numbness and tingling    Past Surgical History:  Procedure Laterality Date  . ANTERIOR LAT LUMBAR FUSION N/A 12/02/2015   Procedure: L3-4 ANTERIOR LATERAL LUMBAR FUSION w/lateral plate;  Surgeon: Eustace Moore, MD;  Location: [facility];  Service: Neurosurgery;  Laterality: N/A;  L3-4 ANTERIOR LATERAL LUMBAR FUSION w/lateral plate  . BACK SURGERY  2012   fusion x 2  . CATARACT EXTRACTION    . CERVICAL FUSION  1988   x 2    . COLONOSCOPY    . FOOT SURGERY    . HERNIA REPAIR Left    inguinal  . KNEE SURGERY    . LAMINECTOMY    . plates/screws/rods removed  2015  . PVC ABLATION N/A 04/24/2019   Procedure: PVC ABLATION;  Surgeon: Constance Haw, MD;  Location: [facility];  Service: Cardiovascular;  Laterality: N/A;  . TOE SURGERY    . TRANSURETHRAL RESECTION OF PROSTATE      Family History  Problem Relation Age of Onset  . Congestive Heart Failure Brother   . CAD Brother   . Leukemia Brother   . Prostate cancer Brother   . Kidney failure Brother   . Kidney Stones Brother   . Throat cancer Mother   . CAD Father     Social History   Socioeconomic History  . Marital status: Married    Spouse name: Not on file  . Number of children: 1  . Years of education: Not on file  . Highest education level: Not on file  Occupational History  . Not on file  Tobacco Use  . Smoking status: Never Smoker  . Smokeless tobacco: Never Used  Vaping Use  . Vaping Use: Never used  Substance and Sexual Activity  . Alcohol use: No  . Drug use: No  . Sexual activity: Not Currently  Other Topics Concern  . Not on file  Social History Narrative  . Not on file   Social Determinants of Health   Financial Resource Strain:   . Difficulty of Paying Living Expenses: Not on file  Food Insecurity: No Food Insecurity  . Worried About Charity fundraiser in the Last Year: Never true  . Ran Out of Food in the Last Year: Never true  Transportation Needs: No Transportation Needs  . Lack of Transportation (Medical): No  . Lack of Transportation (Non-Medical): No  Physical Activity:   . Days of Exercise per Week: Not on file  . Minutes of Exercise per Session: Not on file  Stress:   . Feeling of Stress : Not on file  Social Connections:   . Frequency of Communication with Friends and Family: Not on file  . Frequency of Social Gatherings with Friends and Family: Not on file  . Attends Religious Services:  Not on file  . Active Member of Clubs or Organizations: Not on file  . Attends Archivist Meetings: Not on file  . Marital Status: Not on file  Intimate Partner Violence:   . Fear of Current or Ex-Partner: Not on file  . Emotionally Abused: Not on file  . Physically Abused: Not on file  . Sexually Abused: Not on file    Outpatient Medications Prior to Visit  Medication Sig Dispense Refill  . ALPRAZolam (XANAX) 0.5 MG tablet Take 1 tablet (0.5 mg total) by mouth  2 (two) times daily. 60 tablet 1  . busPIRone (BUSPAR) 10 MG tablet Take 1 tablet (10 mg total) by mouth 3 (three) times daily. 324 tablet 0  . folic acid (FOLVITE) 1 MG tablet Take 1 mg by mouth daily.    . furosemide (LASIX) 40 MG tablet Take 20 mg by mouth daily. Take 20 mg daily and uses 40 mg daily for weight gain of 3 lbs or increased foot swelling.    . gabapentin (NEURONTIN) 600 MG tablet Take 1 tablet (600 mg total) by mouth 2 (two) times daily. (Patient taking differently: Take 600 mg by mouth 3 (three) times daily. ) 270 tablet 3  . levothyroxine (SYNTHROID) 112 MCG tablet Take 1 tablet (112 mcg total) by mouth daily before breakfast. 90 tablet 0  . magnesium oxide (MAG-OX) 400 (241.3 Mg) MG tablet Take 400 mg by mouth daily.    . methocarbamol (ROBAXIN) 750 MG tablet TAKE 1 TABLET BY MOUTH 3  TIMES DAILY 90 tablet 1  . Methotrexate 2.5 MG/ML SOLN Take 0.3 mLs by mouth once a week.    Marland Kitchen oxyCODONE-acetaminophen (PERCOCET) 10-325 MG tablet 1 1/2 tablets four times a day. 180 tablet 0  . pravastatin (PRAVACHOL) 80 MG tablet TAKE 1 TABLET BY MOUTH IN  THE EVENING 90 tablet 2  . ranolazine (RANEXA) 500 MG 12 hr tablet Take 1 tablet (500 mg total) by mouth 2 (two) times daily. 180 tablet 3  . Tetrahydrozoline-PEG (EYE DROPS EXTRA OP) Apply 1-2 drops to eye as needed.     No facility-administered medications prior to visit.    Allergies:   Flecainide, Morphine and related, Niaspan [niacin], Propafenone, and Zoloft  [sertraline]   Social History   Tobacco Use  . Smoking status: Never Smoker  . Smokeless tobacco: Never Used  Vaping Use  . Vaping Use: Never used  Substance Use Topics  . Alcohol use: No  . Drug use: No     Review of Systems  Constitutional: Positive for chills, diaphoresis and fever. Negative for malaise/fatigue.  HENT: Negative for ear pain, sinus pain and sore throat.   Respiratory: Positive for cough. Negative for shortness of breath.   Cardiovascular: Negative for chest pain.  Gastrointestinal: Negative for abdominal pain, nausea and vomiting.  Genitourinary: Positive for frequency. Negative for dysuria and urgency.  Musculoskeletal: Negative for myalgias.  Neurological: Negative for headaches.     Labs/Other Tests and Data Reviewed:    Recent Labs: 03/02/2020: Magnesium 2.1; NT-Pro BNP 1,241 07/27/2020: ALT 7; BUN 12; Creatinine, Ser 1.00; Hemoglobin 10.8; Platelets 48; Potassium 5.0; Sodium 140; TSH 1.840   Recent Lipid Panel Lab Results  Component Value Date/Time   CHOL 121 07/27/2020 02:29 PM   TRIG 111 07/27/2020 02:29 PM   HDL 25 (L) 07/27/2020 02:29 PM   CHOLHDL 4.8 07/27/2020 02:29 PM   LDLCALC 75 07/27/2020 02:29 PM    Wt Readings from Last 3 Encounters:  07/27/20 180 lb 9.6 oz (81.9 kg)  06/30/20 177 lb (80.3 kg)  06/15/20 178 lb (80.7 kg)     Objective:    Vital Signs:  BP 138/72   Pulse 72   Temp (!) 101 F (38.3 C)   SpO2 96%   Physical Exam Constitutional:      Appearance: He is ill-appearing.  HENT:     Right Ear: Ear canal and external ear normal.     Left Ear: Ear canal and external ear normal.     Nose: Nose normal. No congestion  or rhinorrhea.     Comments: No sinus tenderness    Mouth/Throat:     Mouth: Mucous membranes are moist.     Pharynx: No oropharyngeal exudate or posterior oropharyngeal erythema.  Cardiovascular:     Rate and Rhythm: Normal rate and regular rhythm.     Heart sounds: Normal heart sounds.  Pulmonary:      Effort: Pulmonary effort is normal. No respiratory distress.     Breath sounds: Normal breath sounds. No wheezing, rhonchi or rales.  Abdominal:     General: Bowel sounds are normal.     Palpations: Abdomen is soft.     Tenderness: There is no abdominal tenderness.  Musculoskeletal:     Comments: Gait: Imbalance.  Leaning on his wife  Lymphadenopathy:     Cervical: No cervical adenopathy.  Neurological:     Mental Status: He is alert.  Psychiatric:        Mood and Affect: Mood normal.        Behavior: Behavior normal.      ASSESSMENT & PLAN:   1. Influenza Despite negative flu test will treat for influenza with Tamiflu 75 mg twice daily for 5 days. Checking blood work as well.  No absolute clear cell source of fever however symptoms are most consistent with influenza. Patient or his wife to call if worsening and/or go to the emergency department  2. Fever, unspecified fever cause - POC COVID-19 BinaxNow negative - Influenza A/B negative - CBC with Differential/Platelet - Comprehensive metabolic panel - POCT URINALYSIS DIP (CLINITEK) negative - Novel Coronavirus, NAA ([facility]) sent out.   3. Myalgia - Influenza A/B - negative    Orders Placed This Encounter  Procedures  . Novel Coronavirus, NAA ([facility])  . CBC with Differential/Platelet  . Comprehensive metabolic panel  . POC COVID-19 BinaxNow  . Influenza A/B  . POCT URINALYSIS DIP (CLINITEK)     Meds ordered this encounter  Medications  . oseltamivir (TAMIFLU) 75 MG capsule    Sig: Take 1 capsule (75 mg total) by mouth 2 (two) times daily for 5 days.    Dispense:  10 capsule    Refill:  0    COVID-19 Education: The signs and symptoms of COVID-19 were discussed with the patient and how to seek care for testing (follow up with PCP or arrange E-visit). The importance of social distancing was discussed today. Recommended quarantine until covid 19 pcr test comes back.    I spent 20 minutes dedicated to the  care of this patient on the date of this encounter to include face-to-face time with the patient, as well as: reviewing labs/history and examining pt  Follow Up:  In Person prn  Signed,  Rochel Brome, MD  08/17/2020 10:50 PM    [place]

## 2020-08-17 NOTE — Patient Instructions (Signed)

## 2020-08-18 ENCOUNTER — Telehealth: Payer: Self-pay

## 2020-08-18 DIAGNOSIS — I517 Cardiomegaly: Secondary | ICD-10-CM | POA: Diagnosis not present

## 2020-08-18 DIAGNOSIS — U071 COVID-19: Secondary | ICD-10-CM | POA: Diagnosis not present

## 2020-08-18 DIAGNOSIS — R059 Cough, unspecified: Secondary | ICD-10-CM | POA: Diagnosis not present

## 2020-08-18 DIAGNOSIS — J90 Pleural effusion, not elsewhere classified: Secondary | ICD-10-CM | POA: Diagnosis not present

## 2020-08-18 DIAGNOSIS — J811 Chronic pulmonary edema: Secondary | ICD-10-CM | POA: Diagnosis not present

## 2020-08-18 DIAGNOSIS — R0602 Shortness of breath: Secondary | ICD-10-CM | POA: Diagnosis not present

## 2020-08-18 DIAGNOSIS — R0902 Hypoxemia: Secondary | ICD-10-CM | POA: Diagnosis not present

## 2020-08-18 NOTE — Telephone Encounter (Signed)
Mrs. Barnhart called for lab results.  She was notified that his WBC are lower and platelets are stable.  He is not feeling better today. He has no fever and he has been drinking and eating ok.  Mrs. Vandergriff is concerned that he feels very similar to how he felt 2 years ago when he was septic.  Dr. Tobie Poet advised that he go to the ED for evaluation and treatment.

## 2020-08-19 LAB — COMPREHENSIVE METABOLIC PANEL
ALT: 12 IU/L (ref 0–44)
AST: 26 IU/L (ref 0–40)
Albumin/Globulin Ratio: 2.3 — ABNORMAL HIGH (ref 1.2–2.2)
Albumin: 3.4 g/dL — ABNORMAL LOW (ref 3.6–4.6)
Alkaline Phosphatase: 112 IU/L (ref 44–121)
BUN/Creatinine Ratio: 12 (ref 10–24)
BUN: 12 mg/dL (ref 8–27)
Bilirubin Total: 0.7 mg/dL (ref 0.0–1.2)
CO2: 25 mmol/L (ref 20–29)
Calcium: 8.3 mg/dL — ABNORMAL LOW (ref 8.6–10.2)
Chloride: 103 mmol/L (ref 96–106)
Creatinine, Ser: 1.01 mg/dL (ref 0.76–1.27)
GFR calc Af Amer: 80 mL/min/{1.73_m2} (ref >59)
GFR calc non Af Amer: 69 mL/min/{1.73_m2} (ref >59)
Globulin, Total: 1.5 g/dL (ref 1.5–4.5)
Glucose: 127 mg/dL — ABNORMAL HIGH (ref 65–99)
Potassium: 4.6 mmol/L (ref 3.5–5.2)
Sodium: 139 mmol/L (ref 134–144)
Total Protein: 4.9 g/dL — ABNORMAL LOW (ref 6.0–8.5)

## 2020-08-19 LAB — CBC WITH DIFFERENTIAL/PLATELET
Basophils Absolute: 0 10*3/uL (ref 0.0–0.2)
Basos: 1 %
EOS (ABSOLUTE): 0.1 10*3/uL (ref 0.0–0.4)
Eos: 3 %
Hematocrit: 32.5 % — ABNORMAL LOW (ref 37.5–51.0)
Hemoglobin: 10.9 g/dL — ABNORMAL LOW (ref 13.0–17.7)
Immature Grans (Abs): 0 10*3/uL (ref 0.0–0.1)
Immature Granulocytes: 2 %
Lymphocytes Absolute: 0.5 10*3/uL — ABNORMAL LOW (ref 0.7–3.1)
Lymphs: 26 %
MCH: 29.9 pg (ref 26.6–33.0)
MCHC: 33.5 g/dL (ref 31.5–35.7)
MCV: 89 fL (ref 79–97)
Monocytes Absolute: 0.2 10*3/uL (ref 0.1–0.9)
Monocytes: 12 %
Neutrophils Absolute: 1 10*3/uL — ABNORMAL LOW (ref 1.4–7.0)
Neutrophils: 56 %
Platelets: 49 10*3/uL — CL (ref 150–450)
RBC: 3.64 x10E6/uL — ABNORMAL LOW (ref 4.14–5.80)
RDW: 14.1 % (ref 11.6–15.4)
WBC: 1.8 10*3/uL — CL (ref 3.4–10.8)

## 2020-08-19 LAB — SARS-COV-2, NAA 2 DAY TAT

## 2020-08-19 LAB — NOVEL CORONAVIRUS, NAA: SARS-CoV-2, NAA: DETECTED — AB

## 2020-08-20 ENCOUNTER — Telehealth: Payer: Self-pay | Admitting: Nurse Practitioner

## 2020-08-20 NOTE — Telephone Encounter (Signed)
Called to Discuss with patient about Covid symptoms and the use of the monoclonal antibody infusion for those with mild to moderate Covid symptoms and at a high risk of hospitalization.     Patient reports he received the monoclonal infusion in [place] 2 days ago. Appreciative of the call and reports he is feeling much better.    Alda Lea, NP [facility]  [phone]

## 2020-08-24 ENCOUNTER — Telehealth: Payer: Self-pay

## 2020-08-24 NOTE — Telephone Encounter (Signed)
Mechele Claude called to report that Manus developed a rash yesterday.  He tested positive for covid last Tuesday and he did get the antibody infusion at the ED.  His breathing is good with a oxygen saturation of 97%.  He has no fever and his heart rate is 97.  He denies itching or pain with the rash.  They called the ED for instructions yesterday and were told to stop the decadron.  Dr. Tobie Poet is going to notify Dr. Jimmye Norman of the rash.

## 2020-08-26 ENCOUNTER — Ambulatory Visit: Payer: Medicare Other

## 2020-08-26 ENCOUNTER — Other Ambulatory Visit: Payer: Self-pay

## 2020-08-26 MED ORDER — OXYCODONE-ACETAMINOPHEN 10-325 MG PO TABS: ORAL_TABLET | ORAL | 0 refills | Status: AC

## 2020-08-26 NOTE — Chronic Care Management (AMB) (Deleted)
Chronic Care Management Pharmacy  Name: Jesse [name]  MRN: [id number] DOB: 1940/12/09  Chief Complaint/ HPI  Jesse [name],  79 y.o. , male presents for their Initial CCM visit with the clinical pharmacist via telephone due to COVID-19 Pandemic.  PCP : Rochel Brome, MD  Their chronic conditions include: CAD, Chronic Diastolic Heart Failure, Hypothyroidism, BPH, Thrombocytopenia, Anxiety, HLD, Low back pain, CLL.   Office Visits: 12/27/2019 - platelets are lowest PCP has seen. Forwarded results to Dr. Lissa Merlin.  09/25/2019 - routine visit.  Consult Visit: 03/09/2020 - Dr. 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 Should he require an intervention in the future we would be happy to restart Nplate at that time.  03/02/2020 - EP Cardiology - Continues to have symptoms of weakness, fatigue, shortness of breath.  Currently on Ranexa.  Had an attempt to ablate, though he was not having PVCs.  He required platelet transfusions prior to the attempt due to thrombocytopenia.  His most recent cardiac monitor showed a PVC burden of only 6%.  I do feel that this may have gone up.  We will repeat his monitor.  If his PVC burden is low, it is unlikely that this is the cause of his fatigue.  If it is significantly elevated, he may require referral for a second opinion. 03/02/2020 - Cardiology - continue lasix 20 mg daily.  02/13/2020 - Cardiology - Start Ranexa after 48 hour washout of Sotalol.  01/28/2020 - patient deferred increasing sotalol to 80 mg bid. Bilateral leg swelling but patient not taking lasix every day.  01/03/2020 - Continue sotalol normal QT interval. BP at target. Continue statin and loop diuretic.  11/25/2019 - [facility] - PVCs. Change amiodarone to mexiletine due to weakness. Sotalol 40 mg bid started with goal of titrating 80  mg bid. 09/23/2019 Bettina Gavia - continue current treatment. Recommend vaccine.  Medications: Outpatient Encounter Medications as of 08/26/2020  Medication Sig  . ALPRAZolam (XANAX) 0.5 MG tablet Take 1 tablet (0.5 mg total) by mouth 2 (two) times daily.  . busPIRone (BUSPAR) 10 MG tablet Take 1 tablet (10 mg total) by mouth 3 (three) times daily.  . folic acid (FOLVITE) 1 MG tablet Take 1 mg by mouth daily.  . furosemide (LASIX) 40 MG tablet Take 20 mg by mouth daily. Take 20 mg daily and uses 40 mg daily for weight gain of 3 lbs or increased foot swelling.  . gabapentin (NEURONTIN) 600 MG tablet Take 1 tablet (600 mg total) by mouth 2 (two) times daily. (Patient taking differently: Take 600 mg by mouth 3 (three) times daily. )  . levothyroxine (SYNTHROID) 112 MCG tablet Take 1 tablet (112 mcg total) by mouth daily before breakfast.  . magnesium oxide (MAG-OX) 400 (241.3 Mg) MG tablet Take 400 mg by mouth daily.  . methocarbamol (ROBAXIN) 750 MG tablet TAKE 1 TABLET BY MOUTH 3  TIMES DAILY  . Methotrexate 2.5 MG/ML SOLN Take 0.3 mLs by mouth once a week.  Marland Kitchen oxyCODONE-acetaminophen (PERCOCET) 10-325 MG tablet 1 1/2 tablets four times a day.  . pravastatin (PRAVACHOL) 80 MG tablet TAKE 1 TABLET BY MOUTH IN  THE EVENING  . ranolazine (RANEXA) 500 MG 12 hr tablet Take 1 tablet (500 mg total) by mouth 2 (two) times daily.  . Tetrahydrozoline-PEG (EYE DROPS EXTRA OP) Apply 1-2 drops to eye  as needed.   No facility-administered encounter medications on file as of 08/26/2020.   Allergies  Allergen Reactions  . Flecainide   . Morphine And Related Other (See Comments)    sedation  . Niaspan [Niacin] Other (See Comments)    flushing  . Propafenone Other (See Comments)    somnolence  . Zoloft [Sertraline] Other (See Comments)    Drowsiness   SDOH Screenings   Alcohol Screen:   . Last Alcohol Screening Score (AUDIT): Not on file  Depression (PHQ2-9): Low Risk   . PHQ-2 Score: 4  Financial  Resource Strain:   . Difficulty of Paying Living Expenses: Not on file  Food Insecurity: No Food Insecurity  . Worried About Charity fundraiser in the Last Year: Never true  . Ran Out of Food in the Last Year: Never true  Housing: Low Risk   . Last Housing Risk Score: 0  Physical Activity:   . Days of Exercise per Week: Not on file  . Minutes of Exercise per Session: Not on file  Social Connections:   . Frequency of Communication with Friends and Family: Not on file  . Frequency of Social Gatherings with Friends and Family: Not on file  . Attends Religious Services: Not on file  . Active Member of Clubs or Organizations: Not on file  . Attends Archivist Meetings: Not on file  . Marital Status: Not on file  Stress:   . Feeling of Stress : Not on file  Tobacco Use: Low Risk   . Smoking Tobacco Use: Never Smoker  . Smokeless Tobacco Use: Never Used  Transportation Needs: No Transportation Needs  . Lack of Transportation (Medical): No  . Lack of Transportation (Non-Medical): No     Current Diagnosis/Assessment:  Goals Addressed   None    Hyperlipidemia   LDL goal < 100   Lipid Panel     Component Value Date/Time   CHOL 121 07/27/2020 1429   TRIG 111 07/27/2020 1429   HDL 25 (L) 07/27/2020 1429   LDLCALC 75 07/27/2020 1429    Hepatic Function Latest Ref Rng & Units 08/17/2020 07/27/2020 04/20/2020  Total Protein 6.0 - 8.5 g/dL 4.9(L) 5.0(L) 5.0(L)  Albumin 3.6 - 4.6 g/dL 3.4(L) 3.7 3.5(L)  AST 0 - 40 IU/L '26 25 22  ' ALT 0 - 44 IU/L '12 7 7  ' Alk Phosphatase 44 - 121 IU/L 112 99 89  Total Bilirubin 0.0 - 1.2 mg/dL 0.7 0.6 0.5     The ASCVD Risk score Mikey Bussing DC Jr., et al., 2013) failed to calculate for the following reasons:   The 2013 ASCVD risk score is only valid for ages 1 to 62   Patient has failed these meds in past: n/a Patient is currently controlled on the following medications:  . Pravastatin 80 mg every evening  We discussed:  diet and exercise  extensively  Plan  Continue current medications   Heart Failure/PVCs   Type: Diastolic  Last ejection fraction: 55-60% NYHA Class: I (no actitivty limitation)   Patient has failed these meds in past: amiodarone, propafenone, sotalol, flecanide Patient is currently uncontrolled on the following medications: ranolazine 500 mg bid, furosemide 20 mg daily every day but if gains 3 pounds take 40 mg daily.  We discussed diet and exercise extensively Dr. Bettina Gavia is managing heart failure and Dr. Curt Bears is managing the PVCs. He is taking 40 mg -approx 3 times weekly. His feet swell and he elevates them. He does not  salt food and wife doesn't salt foods she cooks. His wife has prepared most meals in the past year due to pandemic.  Patient is going to wear a monitor for 3 days in attempts to determine more about PVCs and how to best address. Patient feels tired a lot and can barely make it to the mailbox and back.   Patient has been very cautious about going out even after vaccine. They've just started going back to church in the past 2 weeks.   Plan  Continue current medications   Hypothyroidism   Lab Results  Component Value Date/Time   TSH 1.840 07/27/2020 02:29 PM   TSH 5.680 (H) 04/20/2020 09:51 AM   TSH 6.790 (H) 01/02/2020 08:52 AM    Patient has failed these meds in past: n/a Patient is currently uncontrolled on the following medications:  . Levothyroxine 100 mcg daily  We discussed: TSH elevated. Patient's labs are scheduled to be rechecked in July.   Plan  Continue current medications    and  Other Diagnosis:Anxiety    Patient has failed these meds in past: n/a Patient is currently controlled on the following medications: alprazolam 0.5 mg bid  We discussed:  Was taking three daily but now takes twice daily. Patient's wife states that Dr. Tobie Poet is concerned with interaction between pain medication and anxiety medication. He really needs this for his nerves and has  taken for 20 years. Patient has never tried another medication per wife. Patient's wife is interested in discussing a trial of duloxetine with Dr. Tobie Poet at next visit.    Plan  Continue current medications. Recommend considering adding a maintenance medication like duloxetine for anxiety and chronic pain.    Pain   Patient has failed these meds in past: celecoxib  Patient is currently uncontrolled on the following medications:  . Oxycodone 10-325 - 1 tablets q8h prn . Gabapentin 600 mg tid . Methocarbamol 750 mg qid   We discussed:  Methocarbamol is ~$40/month on patient's insurance but patient isn't interested in trying something else at this time. Wife indicates that other medications are affordable. Discussed option of an anti-anxiety medication with pain management properties (duloxetine) with patient's wife. She is interested in discussing with Dr. Tobie Poet at next visit.   Plan  Continue current medications   Health Maintenance   Patient is currently uncontrolled on the following medications:  . Folic acid 1 mg daily - supplementation due to MTX . Methotrexate shot weekly - vasculitis . Tetrahydrozoline eye drops - dry eyes.  . Trazodone 50 -100 mg qhs prn sleep . Magnesium tablet daily  . Trace minerals - no cramps - daily  We discussed:  Patient has vasculitis. Wife gives him an injection weekly at home. They were trying to titrate dose of MTX down but the rash came back. The rash looked like broke vessels at the surface. Dr. Jimmye Norman manages.   Patient gets up and down throughout the night and sleeps a good amount during the day.   Plan  Continue current medications  Vaccines   Reviewed and discussed patient's vaccination history.  [place] - September 30, 2019 and October 21, 2019.   Immunization History  Administered Date(s) Administered  . Fluad Quad(high Dose 65+) 07/27/2020  . Influenza-Unspecified 06/19/2013, 06/19/2019  . PFIZER SARS-COV-2  Vaccination 09/30/2019, 10/21/2019  . Pneumococcal Conjugate-13 06/19/2014  . Pneumococcal Polysaccharide-23 08/31/2018  . Zoster 02/16/2011    Plan  Recommended patient receive annual flu vaccine in office.   Medication  Management   Pt uses [facility] mail order pharmacy for all medications Uses pill box? Uses chart.  Pt endorses good compliance  We discussed: Wife uses a chart printed out and he can check off when he takes. Generic medications are free with mail order.   Plan  Continue current medication management strategy    Follow up: 6 month phone visit

## 2020-08-31 ENCOUNTER — Ambulatory Visit (INDEPENDENT_AMBULATORY_CARE_PROVIDER_SITE_OTHER): Payer: Medicare Other | Admitting: Family Medicine

## 2020-08-31 ENCOUNTER — Other Ambulatory Visit: Payer: Self-pay

## 2020-08-31 VITALS — HR 66

## 2020-08-31 DIAGNOSIS — J018 Other acute sinusitis: Secondary | ICD-10-CM

## 2020-08-31 DIAGNOSIS — U071 COVID-19: Secondary | ICD-10-CM | POA: Diagnosis not present

## 2020-08-31 DIAGNOSIS — J208 Acute bronchitis due to other specified organisms: Secondary | ICD-10-CM | POA: Diagnosis not present

## 2020-08-31 MED ORDER — AZITHROMYCIN 250 MG PO TABS: ORAL_TABLET | ORAL | 0 refills | Status: AC

## 2020-08-31 NOTE — Progress Notes (Signed)
Acute Office Visit  Subjective:    Patient ID: Jesse [name], male    DOB: [birth year], 79 y.o.   MRN: [id number]  Chief Complaint  Patient presents with  . Cough  . Sinusitis    HPI  Patient is in today for sinus infection and cough. NO fevers, chills, sweats. Pt has sinus headache. No earache. NO sore throat. Pt diagnosed with covid 19 two weeks ago. Received MAB infusion. Eating fairly good.  Treated vasculitis by dermatology. On prednisone.    Past Medical History:  Diagnosis Date  . Anemia    takes Ferrous Sulfate daily  . Anxiety    takes Xanax daily  . Arthritis   . Atherosclerotic heart disease of native coronary artery without angina pectoris   . BPH (benign prostatic hyperplasia) 10/25/2017  . Bradycardia 10/25/2017  . CAD (coronary artery disease) 10/25/2017  . Chest pain 10/25/2017  . Chronic back pain   . Chronic ITP (idiopathic thrombocytopenia) (HCC) 08/08/2013  . Chronic lymphocytic leukemia ([place]) 10/25/2017  . Chronic lymphocytic leukemia of B-cell type not having achieved remission ([place])   . Chronic pain syndrome   . Constipation    takes Colace daily as needed  . Dry eyes    eye drops as needed  . Dyspnea on exertion 10/25/2017  . Essential (primary) hypertension   . History of blood transfusion    no abnormal reaction noted  . History of kidney stones   . Hyperlipemia 10/25/2017  . Hyperlipidemia    takes Pravastatin daily  . Hypertensive heart disease without heart failure   . Infusion reaction 4 yrs ago   platelet infusion broke out head to toe  . Insomnia    takes trazodone nightly as needed  . Leukemia ([place])   . Low back pain 12/12/2012  . Lumbar radiculopathy 01/09/2013  . Major depressive disorder, single episode, mild ([place])   . Muscle spasm    takes Robaxin daily as needed  . Nerve pain    takes Gabapentin daily   . Nocturia   . Opioid dependence, uncomplicated ([place])   . Other primary thrombocytopenia ([place])   . Other specified hypothyroidism    . Other specified intestinal infections   . Renal stones 10/25/2017  . Status post lumbar surgery 12/02/2015  . Thrombocytopenia, secondary 10/25/2017   Overview:  secondary to CLL  . Vitamin B12 deficiency    takes Vit B12 daily  . Weakness    numbness and tingling    Past Surgical History:  Procedure Laterality Date  . ANTERIOR LAT LUMBAR FUSION N/A 12/02/2015   Procedure: L3-4 ANTERIOR LATERAL LUMBAR FUSION w/lateral plate;  Surgeon: Eustace Moore, MD;  Location: [facility];  Service: Neurosurgery;  Laterality: N/A;  L3-4 ANTERIOR LATERAL LUMBAR FUSION w/lateral plate  . BACK SURGERY  2012   fusion x 2  . CATARACT EXTRACTION    . CERVICAL FUSION  1988   x 2  . COLONOSCOPY    . FOOT SURGERY    . HERNIA REPAIR Left    inguinal  . KNEE SURGERY    . LAMINECTOMY    . plates/screws/rods removed  2015  . PVC ABLATION N/A 04/24/2019   Procedure: PVC ABLATION;  Surgeon: Constance Haw, MD;  Location: [facility];  Service: Cardiovascular;  Laterality: N/A;  . TOE SURGERY    . TRANSURETHRAL RESECTION OF PROSTATE      Family History  Problem Relation Age of Onset  . Congestive Heart Failure  Brother   . CAD Brother   . Leukemia Brother   . Prostate cancer Brother   . Kidney failure Brother   . Kidney Stones Brother   . Throat cancer Mother   . CAD Father     Social History   Socioeconomic History  . Marital status: Married    Spouse name: Not on file  . Number of children: 1  . Years of education: Not on file  . Highest education level: Not on file  Occupational History  . Not on file  Tobacco Use  . Smoking status: Never Smoker  . Smokeless tobacco: Never Used  Vaping Use  . Vaping Use: Never used  Substance and Sexual Activity  . Alcohol use: No  . Drug use: No  . Sexual activity: Not Currently  Other Topics Concern  . Not on file  Social History Narrative  . Not on file   Social Determinants of Health   Financial Resource Strain: Not on file   Food Insecurity: No Food Insecurity  . Worried About Charity fundraiser in the Last Year: Never true  . Ran Out of Food in the Last Year: Never true  Transportation Needs: No Transportation Needs  . Lack of Transportation (Medical): No  . Lack of Transportation (Non-Medical): No  Physical Activity: Not on file  Stress: Not on file  Social Connections: Not on file  Intimate Partner Violence: Not on file    Outpatient Medications Prior to Visit  Medication Sig Dispense Refill  . ALPRAZolam (XANAX) 0.5 MG tablet Take 1 tablet (0.5 mg total) by mouth 2 (two) times daily. 60 tablet 1  . busPIRone (BUSPAR) 10 MG tablet Take 1 tablet (10 mg total) by mouth 3 (three) times daily. 149 tablet 0  . folic acid (FOLVITE) 1 MG tablet Take 1 mg by mouth daily.    . furosemide (LASIX) 40 MG tablet Take 20 mg by mouth daily. Take 20 mg daily and uses 40 mg daily for weight gain of 3 lbs or increased foot swelling.    . gabapentin (NEURONTIN) 600 MG tablet Take 1 tablet (600 mg total) by mouth 2 (two) times daily. (Patient taking differently: Take 600 mg by mouth 3 (three) times daily. ) 270 tablet 3  . levothyroxine (SYNTHROID) 112 MCG tablet Take 1 tablet (112 mcg total) by mouth daily before breakfast. 90 tablet 0  . magnesium oxide (MAG-OX) 400 (241.3 Mg) MG tablet Take 400 mg by mouth daily.    . methocarbamol (ROBAXIN) 750 MG tablet TAKE 1 TABLET BY MOUTH 3  TIMES DAILY 90 tablet 1  . Methotrexate 2.5 MG/ML SOLN Take 0.3 mLs by mouth once a week.    Marland Kitchen oxyCODONE-acetaminophen (PERCOCET) 10-325 MG tablet 1 1/2 tablets four times a day. 180 tablet 0  . pravastatin (PRAVACHOL) 80 MG tablet TAKE 1 TABLET BY MOUTH IN  THE EVENING 90 tablet 2  . ranolazine (RANEXA) 500 MG 12 hr tablet Take 1 tablet (500 mg total) by mouth 2 (two) times daily. 180 tablet 3  . Tetrahydrozoline-PEG (EYE DROPS EXTRA OP) Apply 1-2 drops to eye as needed.     No facility-administered medications prior to visit.    Allergies   Allergen Reactions  . Flecainide   . Morphine And Related Other (See Comments)    sedation  . Niaspan [Niacin] Other (See Comments)    flushing  . Propafenone Other (See Comments)    somnolence  . Zoloft [Sertraline] Other (See Comments)  Drowsiness    Review of Systems  Constitutional: Positive for fatigue. Negative for chills and fever.  HENT: Positive for congestion and sinus pain. Negative for ear pain and sore throat.   Respiratory: Positive for cough. Negative for shortness of breath.   Cardiovascular: Negative for chest pain.  Gastrointestinal: Positive for diarrhea. Negative for abdominal pain, constipation, nausea and vomiting.  Neurological: Positive for headaches.       Objective:    Physical Exam Vitals reviewed.  Constitutional:      Appearance: Normal appearance.  HENT:     Nose:     Comments: BL sinus tenderness (mild) Cardiovascular:     Rate and Rhythm: Normal rate and regular rhythm.     Heart sounds: Normal heart sounds.  Pulmonary:     Effort: Pulmonary effort is normal.     Breath sounds: Normal breath sounds. No wheezing or rhonchi.  Neurological:     Mental Status: He is alert.  Psychiatric:        Mood and Affect: Mood normal.        Behavior: Behavior normal.    VS taken at home by wife earlier.  Pulse 66   SpO2 96%    Wt Readings from Last 3 Encounters:  07/27/20 180 lb 9.6 oz (81.9 kg)  06/30/20 177 lb (80.3 kg)  06/15/20 178 lb (80.7 kg)    Health Maintenance Due  Topic Date Due  . TETANUS/TDAP  Never done  . COVID-19 Vaccine (3 - Pfizer risk 4-dose series) 11/18/2019    There are no preventive care reminders to display for this patient.   Lab Results  Component Value Date   TSH 1.840 07/27/2020   Lab Results  Component Value Date   WBC 1.8 (LL) 08/17/2020   HGB 10.9 (L) 08/17/2020   HCT 32.5 (L) 08/17/2020   MCV 89 08/17/2020   PLT 49 (LL) 08/17/2020   Lab Results  Component Value Date   NA 139 08/17/2020    K 4.6 08/17/2020   CO2 25 08/17/2020   GLUCOSE 127 (H) 08/17/2020   BUN 12 08/17/2020   CREATININE 1.01 08/17/2020   BILITOT 0.7 08/17/2020   ALKPHOS 112 08/17/2020   AST 26 08/17/2020   ALT 12 08/17/2020   PROT 4.9 (L) 08/17/2020   ALBUMIN 3.4 (L) 08/17/2020   CALCIUM 8.3 (L) 08/17/2020   ANIONGAP 6 09/22/2018   Lab Results  Component Value Date   CHOL 121 07/27/2020   Lab Results  Component Value Date   HDL 25 (L) 07/27/2020   Lab Results  Component Value Date   LDLCALC 75 07/27/2020   Lab Results  Component Value Date   TRIG 111 07/27/2020   Lab Results  Component Value Date   CHOLHDL 4.8 07/27/2020   No results found for: HGBA1C     Assessment & Plan:  1. Acute non-recurrent sinusitis of other sinus 2. Acute bronchitis due to COVID-19 virus  Started on zithromax pack.   Continue mucinex.   Push fluids.   Meds ordered this encounter  Medications  . azithromycin (ZITHROMAX) 250 MG tablet    Sig: 2 DAILY FOR FIRST DAY, THEN DECREASE TO ONE DAILY FOR 4 MORE DAYS.    Dispense:  6 tablet    Refill:  0   I spent 15 minutes dedicated to the care of this patient on the date of this encounter to include face-to-face time with the patient, as well as: reviewing his chart.   Follow-up: prn  An After Visit Summary was printed and given to the patient.  Rochel Brome, MD [facility] [phone]

## 2020-09-01 ENCOUNTER — Encounter: Payer: Self-pay | Admitting: Family Medicine

## 2020-09-01 ENCOUNTER — Ambulatory Visit: Payer: Medicare Other

## 2020-09-01 ENCOUNTER — Other Ambulatory Visit: Payer: Self-pay

## 2020-09-01 DIAGNOSIS — I5032 Chronic diastolic (congestive) heart failure: Secondary | ICD-10-CM

## 2020-09-01 DIAGNOSIS — E782 Mixed hyperlipidemia: Secondary | ICD-10-CM

## 2020-09-01 MED ORDER — METHOCARBAMOL 750 MG PO TABS: 750.0000 mg | ORAL_TABLET | Freq: Three times a day (TID) | ORAL | 2 refills | Status: AC

## 2020-09-01 MED ORDER — ALPRAZOLAM 0.5 MG PO TABS: 0.5000 mg | ORAL_TABLET | Freq: Two times a day (BID) | ORAL | 2 refills | Status: AC

## 2020-09-01 NOTE — Chronic Care Management (AMB) (Signed)
 Chronic Care Management Pharmacy  Name: Jesse [name]  MRN: [id number] DOB: 04/09/1941  Chief Complaint/ HPI  Jesse [name],  79 y.o. , male presents for their Follow-Up CCM visit with the clinical pharmacist via telephone due to COVID-19 Pandemic.  PCP : Cox, Kirsten, MD  Their chronic conditions include: CAD, Chronic Diastolic Heart Failure, Hypothyroidism, BPH, Thrombocytopenia, Anxiety, HLD, Low back pain, CLL.   Office Visits: 08/31/2020 - acute non-recurrent sinusitis and acute bronchitis due to COVID. Started on Zpack. Push fluids and continue Mucinex.  08/17/2020 - negative flu test. Treat with Tamiflu 75 mg bid. Rapid COVID and Flu negative.  07/27/2020 - decrease gabapentin due to swelling. Buspirone 10 mg tid. Try to wean alprazolam if possible. Flu vaccine given.  Consult Visit: 08/18/2020 - ED for evaluation. Tested positive for COVID. Antibodies administered.  06/30/2020 - Vascular Surgery - no intervention at this time.  06/15/2020 - Cardiology - continue current medications. Improved PVCs.  06/05/2020 - Cardio - stable CAD. Limit dose of gabapentin to bid due to foot selling.    Medications: Outpatient Encounter Medications as of 09/01/2020  Medication Sig  . azithromycin (ZITHROMAX) 250 MG tablet 2 DAILY FOR FIRST DAY, THEN DECREASE TO ONE DAILY FOR 4 MORE DAYS.  . gabapentin (NEURONTIN) 600 MG tablet Take 1 tablet (600 mg total) by mouth 2 (two) times daily. (Patient taking differently: Take 600 mg by mouth 3 (three) times daily.)  . levothyroxine (SYNTHROID) 112 MCG tablet Take 1 tablet (112 mcg total) by mouth daily before breakfast.  . albuterol (VENTOLIN HFA) 108 (90 Base) MCG/ACT inhaler   . busPIRone (BUSPAR) 10 MG tablet Take 1 tablet (10 mg total) by mouth 3 (three) times daily.  . folic acid (FOLVITE) 1 MG tablet Take 1 mg by mouth daily.  . furosemide (LASIX) 40 MG tablet Take 20 mg by mouth daily. Take 20 mg daily and uses 40 mg daily for weight gain of 3  lbs or increased foot swelling.  . magnesium oxide (MAG-OX) 400 (241.3 Mg) MG tablet Take 400 mg by mouth daily.  . Methotrexate 2.5 MG/ML SOLN Take 0.3 mLs by mouth once a week.  . methotrexate 250 MG/10ML injection Inject into the skin.  . oxyCODONE-acetaminophen (PERCOCET) 10-325 MG tablet 1 1/2 tablets four times a day.  . pravastatin (PRAVACHOL) 80 MG tablet TAKE 1 TABLET BY MOUTH IN  THE EVENING  . predniSONE (DELTASONE) 20 MG tablet Take by mouth.  . ranolazine (RANEXA) 500 MG 12 hr tablet Take 1 tablet (500 mg total) by mouth 2 (two) times daily.  . Tetrahydrozoline-PEG (EYE DROPS EXTRA OP) Apply 1-2 drops to eye as needed.  . [DISCONTINUED] ALPRAZolam (XANAX) 0.5 MG tablet Take 1 tablet (0.5 mg total) by mouth 2 (two) times daily.  . [DISCONTINUED] methocarbamol (ROBAXIN) 750 MG tablet TAKE 1 TABLET BY MOUTH 3  TIMES DAILY   No facility-administered encounter medications on file as of 09/01/2020.   Allergies  Allergen Reactions  . Flecainide   . Niaspan [Niacin] Other (See Comments)    flushing  . Propafenone Other (See Comments)    somnolence  . Zoloft [Sertraline] Other (See Comments)    Drowsiness   SDOH Screenings   Alcohol Screen: Not on file  Depression (PHQ2-9): Low Risk   . PHQ-2 Score: 4  Financial Resource Strain: Not on file  Food Insecurity: No Food Insecurity  . Worried About Running Out of Food in the Last Year: Never true  . Ran Out of Food   in the Last Year: Never true  Housing: Low Risk   . Last Housing Risk Score: 0  Physical Activity: Not on file  Social Connections: Not on file  Stress: Not on file  Tobacco Use: Low Risk   . Smoking Tobacco Use: Never Smoker  . Smokeless Tobacco Use: Never Used  Transportation Needs: No Transportation Needs  . Lack of Transportation (Medical): No  . Lack of Transportation (Non-Medical): No     Current Diagnosis/Assessment:  Goals Addressed            This Visit's Progress   . Pharmacy Care Plan        CARE PLAN ENTRY (see longitudinal plan of care for additional care plan information)  Current Barriers:  . Chronic Disease Management support, education, and care coordination needs related to Hyperlipidemia and Heart Failure   Heart Failure BP Readings from Last 3 Encounters:  08/17/20 138/72  07/27/20 124/62  06/30/20 105/68   . Pharmacist Clinical Goal(s): o Over the next 90 days, patient will work with PharmD and providers to manage heart failure, maintain BP goal <140/90 and minimize swelling. . Current regimen:  o Furosemide 20 mg daily and 40 mg on days with weight gain greater than 3 lbs o Ranolazine 500 mg bid  . Interventions: o Recommend patient continue to elevate feet to reduce swelling.  o Recommend patient complete recommended heart monitoring for next steps on PVCs.  . Patient self care activities - Over the next 90 days, patient will: o Continue taking medication daily as prescribed. o Ensure daily salt intake < 2300 mg/day  Hyperlipidemia Lab Results  Component Value Date/Time   LDLCALC 75 07/27/2020 02:29 PM   . Pharmacist Clinical Goal(s): o Over the next 90 days, patient will work with PharmD and providers to maintain LDL goal < 100 . Current regimen:  o Pravastatin 80 mg daily . Interventions: o Recommend continuing to eat heart healthy diet.  . Patient self care activities - Over the next 90 days, patient will: o Continue to take medication as prescribed.   Anxiety . Pharmacist Clinical Goal(s) o Over the next 90 days, patient will work with PharmD and providers to manage anxiety . Current regimen:  o Alprazolam 0.5 mg bid  . Interventions: o Recommend patient discuss with Dr. Tobie Poet about alternative medications to help manage anxiety at next visit.  . Patient self care activities - Over the next 90 days, patient will: o Continue taking medication as prescribed and contact pharmacist or provider with any questions.     Medication  management . Pharmacist Clinical Goal(s): o Over the next 90 days, patient will work with PharmD and providers to maintain optimal medication adherence . Current pharmacy: [facility] . Interventions o Comprehensive medication review performed. o Continue current medication management strategy . Patient self care activities - Over the next 90 days, patient will: o Focus on medication adherence by continuing to use chart.  o Take medications as prescribed o Report any questions or concerns to PharmD and/or provider(s)  Initial goal documentation       Hyperlipidemia   LDL goal < 100   Lipid Panel     Component Value Date/Time   CHOL 121 07/27/2020 1429   TRIG 111 07/27/2020 1429   HDL 25 (L) 07/27/2020 1429   LDLCALC 75 07/27/2020 1429    Hepatic Function Latest Ref Rng & Units 08/17/2020 07/27/2020 04/20/2020  Total Protein 6.0 - 8.5 g/dL 4.9(L) 5.0(L) 5.0(L)  Albumin 3.6 -  4.6 g/dL 3.4(L) 3.7 3.5(L)  AST 0 - 40 IU/L 26 25 22  ALT 0 - 44 IU/L 12 7 7  Alk Phosphatase 44 - 121 IU/L 112 99 89  Total Bilirubin 0.0 - 1.2 mg/dL 0.7 0.6 0.5     The ASCVD Risk score (Goff DC Jr., et al., 2013) failed to calculate for the following reasons:   The 2013 ASCVD risk score is only valid for ages 40 to 79   Patient has failed these meds in past: n/a Patient is currently controlled on the following medications:  . Pravastatin 80 mg every evening  We discussed:  diet and exercise extensively  Plan  Continue current medications   Heart Failure/PVCs   Type: Diastolic  Last ejection fraction: 55-60% NYHA Class: I (no actitivty limitation)   Patient has failed these meds in past: amiodarone, propafenone, sotalol, flecanide Patient is currently uncontrolled on the following medications:   ranolazine 500 mg bid  furosemide 20 mg daily every day but if gains 3 pounds take 40 mg daily.  116/64 mmHg and pulse 66 today. Patient is recovering from COVID and resting a lot.  Wife is concerned that he is not feeling better.  We discussed diet and exercise extensively Dr. Munley is managing heart failure and Dr. Camnitz is managing the PVCs. He is taking 40 mg -approx 3 times weekly. His feet swell and he elevates them. He does not salt food and wife doesn't salt foods she cooks. His wife has prepared most meals in the past year and a half due to pandemic.   Plan  Continue current medications   Hypothyroidism   Lab Results  Component Value Date/Time   TSH 1.840 07/27/2020 02:29 PM   TSH 5.680 (H) 04/20/2020 09:51 AM   TSH 6.790 (H) 01/02/2020 08:52 AM    Patient has failed these meds in past: n/a Patient is currently controlled on the following medications:  . Levothyroxine 112 mcg daily  We discussed: TSH improved with most recent labs.   Plan  Continue current medications    and  Other Diagnosis:Anxiety    Patient has failed these meds in past: n/a Patient is currently controlled on the following medications:   alprazolam 0.5 mg bid  Buspirone 10 mg tid   We discussed:  Patient did not feel that duloxetine helps him and is not taking at this time. Continue to discuss reducing alprazolam once patient is recovered from COVID.    Plan  Continue current medications.    Pain   Patient has failed these meds in past: celecoxib  Patient is currently uncontrolled on the following medications:  . Oxycodone 10-325 - 1 tablets q8h prn . Gabapentin 600 mg tid . Methocarbamol 750 mg qid   We discussed:  Pharmacist assisted wife in finding more affordable option for methocarbamol using [facility]. Patient's wife states she received a letter from insurance saying it would not be covered in 2022. Patient's wife acknowledged understanding and appreciative of assistance.   Plan  Continue current medications   Health Maintenance   Patient is currently uncontrolled on the following medications:  . Folic acid 1 mg daily - supplementation due to  MTX . Methotrexate shot weekly - vasculitis . Tetrahydrozoline eye drops - dry eyes.  . Trazodone 50 -100 mg qhs prn sleep . Magnesium tablet daily  . Trace minerals - no cramps - daily  We discussed:  Patient is currently resting a lot and not feeling well due to COVID. Dr.   Cox listened to patient's lungs yesterday and initiated an antibiotic. Patient received antibody treatment at the emergency room.   Plan  Continue current medications  Vaccines   Reviewed and discussed patient's vaccination history.  [facility] - September 30, 2019 and October 21, 2019.   Immunization History  Administered Date(s) Administered  . Fluad Quad(high Dose 65+) 07/27/2020  . Influenza-Unspecified 06/19/2013, 06/19/2019  . PFIZER SARS-COV-2 Vaccination 09/30/2019, 10/21/2019  . Pneumococcal Conjugate-13 06/19/2014  . Pneumococcal Polysaccharide-23 08/31/2018  . Zoster 02/16/2011    Plan  Recommended patient receive annual flu vaccine in office.   Medication Management   Pt uses [facility] mail order pharmacy for all medications Uses pill box? Uses chart.  Pt endorses good compliance  We discussed: Wife uses a chart printed out and he can check off when he takes. Generic medications are free with mail order.   Plan  Continue current medication management strategy    Follow up: 3 month phone visit

## 2020-09-03 DIAGNOSIS — U071 COVID-19: Secondary | ICD-10-CM | POA: Diagnosis not present

## 2020-09-03 DIAGNOSIS — Z79899 Other long term (current) drug therapy: Secondary | ICD-10-CM | POA: Diagnosis not present

## 2020-09-03 DIAGNOSIS — E7800 Pure hypercholesterolemia, unspecified: Secondary | ICD-10-CM | POA: Diagnosis not present

## 2020-09-03 DIAGNOSIS — I509 Heart failure, unspecified: Secondary | ICD-10-CM | POA: Diagnosis not present

## 2020-09-03 DIAGNOSIS — R531 Weakness: Secondary | ICD-10-CM | POA: Diagnosis not present

## 2020-09-03 DIAGNOSIS — R0602 Shortness of breath: Secondary | ICD-10-CM | POA: Diagnosis not present

## 2020-09-03 DIAGNOSIS — J1282 Pneumonia due to coronavirus disease 2019: Secondary | ICD-10-CM | POA: Diagnosis not present

## 2020-09-03 DIAGNOSIS — J90 Pleural effusion, not elsewhere classified: Secondary | ICD-10-CM | POA: Diagnosis not present

## 2020-09-03 DIAGNOSIS — Z87442 Personal history of urinary calculi: Secondary | ICD-10-CM | POA: Diagnosis not present

## 2020-09-03 DIAGNOSIS — I110 Hypertensive heart disease with heart failure: Secondary | ICD-10-CM | POA: Diagnosis not present

## 2020-09-03 DIAGNOSIS — E039 Hypothyroidism, unspecified: Secondary | ICD-10-CM | POA: Diagnosis not present

## 2020-09-03 DIAGNOSIS — R188 Other ascites: Secondary | ICD-10-CM | POA: Diagnosis not present

## 2020-09-03 DIAGNOSIS — R5383 Other fatigue: Secondary | ICD-10-CM | POA: Diagnosis not present

## 2020-09-03 DIAGNOSIS — C9110 Chronic lymphocytic leukemia of B-cell type not having achieved remission: Secondary | ICD-10-CM | POA: Diagnosis not present

## 2020-09-03 DIAGNOSIS — Z79891 Long term (current) use of opiate analgesic: Secondary | ICD-10-CM | POA: Diagnosis not present

## 2020-09-03 DIAGNOSIS — I2699 Other pulmonary embolism without acute cor pulmonale: Secondary | ICD-10-CM | POA: Diagnosis not present

## 2020-09-03 DIAGNOSIS — J440 Chronic obstructive pulmonary disease with acute lower respiratory infection: Secondary | ICD-10-CM | POA: Diagnosis not present

## 2020-09-03 DIAGNOSIS — K859 Acute pancreatitis without necrosis or infection, unspecified: Secondary | ICD-10-CM | POA: Diagnosis not present

## 2020-09-04 ENCOUNTER — Telehealth: Payer: Self-pay

## 2020-09-04 ENCOUNTER — Other Ambulatory Visit: Payer: Self-pay | Admitting: Family Medicine

## 2020-09-04 MED ORDER — CEFDINIR 300 MG PO CAPS: 300.0000 mg | ORAL_CAPSULE | Freq: Two times a day (BID) | ORAL | 0 refills | Status: AC

## 2020-09-04 NOTE — Telephone Encounter (Signed)
Mrs. Laur called to report that Jesse [name] left AMA from the hospital this morning.  He had been admitted with covid pneumonia and was receiving IV antibiotics.  His temperature was 101.5 and O2 saturation was 92%.  He was encouraged to eat healthy and drink plenty of fluids. Instructed to go back to the ED if oxygen level drops and symptoms worsen.  Dr. Tobie Poet is going to send a different antibiotic to the pharmacy.

## 2020-09-05 DIAGNOSIS — J849 Interstitial pulmonary disease, unspecified: Secondary | ICD-10-CM | POA: Diagnosis not present

## 2020-09-05 DIAGNOSIS — D61818 Other pancytopenia: Secondary | ICD-10-CM | POA: Diagnosis not present

## 2020-09-05 DIAGNOSIS — J9601 Acute respiratory failure with hypoxia: Secondary | ICD-10-CM | POA: Diagnosis not present

## 2020-09-05 DIAGNOSIS — D693 Immune thrombocytopenic purpura: Secondary | ICD-10-CM | POA: Diagnosis not present

## 2020-09-05 DIAGNOSIS — I5032 Chronic diastolic (congestive) heart failure: Secondary | ICD-10-CM | POA: Diagnosis not present

## 2020-09-05 DIAGNOSIS — J1282 Pneumonia due to coronavirus disease 2019: Secondary | ICD-10-CM | POA: Diagnosis not present

## 2020-09-05 DIAGNOSIS — E785 Hyperlipidemia, unspecified: Secondary | ICD-10-CM | POA: Diagnosis not present

## 2020-09-05 DIAGNOSIS — Z9221 Personal history of antineoplastic chemotherapy: Secondary | ICD-10-CM | POA: Diagnosis not present

## 2020-09-05 DIAGNOSIS — Z7401 Bed confinement status: Secondary | ICD-10-CM | POA: Diagnosis not present

## 2020-09-05 DIAGNOSIS — R918 Other nonspecific abnormal finding of lung field: Secondary | ICD-10-CM | POA: Diagnosis not present

## 2020-09-05 DIAGNOSIS — M2550 Pain in unspecified joint: Secondary | ICD-10-CM | POA: Diagnosis not present

## 2020-09-05 DIAGNOSIS — I878 Other specified disorders of veins: Secondary | ICD-10-CM | POA: Diagnosis not present

## 2020-09-05 DIAGNOSIS — I2699 Other pulmonary embolism without acute cor pulmonale: Secondary | ICD-10-CM | POA: Diagnosis not present

## 2020-09-05 DIAGNOSIS — C9110 Chronic lymphocytic leukemia of B-cell type not having achieved remission: Secondary | ICD-10-CM | POA: Diagnosis not present

## 2020-09-05 DIAGNOSIS — Z66 Do not resuscitate: Secondary | ICD-10-CM | POA: Diagnosis not present

## 2020-09-05 DIAGNOSIS — A419 Sepsis, unspecified organism: Secondary | ICD-10-CM | POA: Diagnosis not present

## 2020-09-05 DIAGNOSIS — I4510 Unspecified right bundle-branch block: Secondary | ICD-10-CM | POA: Diagnosis not present

## 2020-09-05 DIAGNOSIS — Z85820 Personal history of malignant melanoma of skin: Secondary | ICD-10-CM | POA: Diagnosis not present

## 2020-09-05 DIAGNOSIS — Z515 Encounter for palliative care: Secondary | ICD-10-CM | POA: Diagnosis not present

## 2020-09-05 DIAGNOSIS — R9431 Abnormal electrocardiogram [ECG] [EKG]: Secondary | ICD-10-CM | POA: Diagnosis not present

## 2020-09-05 DIAGNOSIS — N179 Acute kidney failure, unspecified: Secondary | ICD-10-CM | POA: Diagnosis not present

## 2020-09-05 DIAGNOSIS — I5030 Unspecified diastolic (congestive) heart failure: Secondary | ICD-10-CM | POA: Diagnosis not present

## 2020-09-05 DIAGNOSIS — R799 Abnormal finding of blood chemistry, unspecified: Secondary | ICD-10-CM | POA: Diagnosis not present

## 2020-09-05 DIAGNOSIS — I2510 Atherosclerotic heart disease of native coronary artery without angina pectoris: Secondary | ICD-10-CM | POA: Diagnosis not present

## 2020-09-05 DIAGNOSIS — J90 Pleural effusion, not elsewhere classified: Secondary | ICD-10-CM | POA: Diagnosis not present

## 2020-09-05 DIAGNOSIS — U071 COVID-19: Secondary | ICD-10-CM | POA: Diagnosis not present

## 2020-09-05 DIAGNOSIS — N1831 Chronic kidney disease, stage 3a: Secondary | ICD-10-CM | POA: Diagnosis not present

## 2020-09-05 DIAGNOSIS — M5136 Other intervertebral disc degeneration, lumbar region: Secondary | ICD-10-CM | POA: Diagnosis not present

## 2020-09-05 DIAGNOSIS — R140 Abdominal distension (gaseous): Secondary | ICD-10-CM | POA: Diagnosis not present

## 2020-09-05 DIAGNOSIS — Z981 Arthrodesis status: Secondary | ICD-10-CM | POA: Diagnosis not present

## 2020-09-05 DIAGNOSIS — A4189 Other specified sepsis: Secondary | ICD-10-CM | POA: Diagnosis not present

## 2020-09-05 DIAGNOSIS — R0689 Other abnormalities of breathing: Secondary | ICD-10-CM | POA: Diagnosis not present

## 2020-09-05 DIAGNOSIS — U099 Post covid-19 condition, unspecified: Secondary | ICD-10-CM | POA: Diagnosis not present

## 2020-09-05 DIAGNOSIS — R6520 Severe sepsis without septic shock: Secondary | ICD-10-CM | POA: Diagnosis not present

## 2020-09-05 DIAGNOSIS — R0602 Shortness of breath: Secondary | ICD-10-CM | POA: Diagnosis not present

## 2020-09-05 DIAGNOSIS — I517 Cardiomegaly: Secondary | ICD-10-CM | POA: Diagnosis not present

## 2020-09-05 DIAGNOSIS — I493 Ventricular premature depolarization: Secondary | ICD-10-CM | POA: Diagnosis not present

## 2020-09-05 DIAGNOSIS — D849 Immunodeficiency, unspecified: Secondary | ICD-10-CM | POA: Diagnosis not present

## 2020-09-05 DIAGNOSIS — R451 Restlessness and agitation: Secondary | ICD-10-CM | POA: Diagnosis not present

## 2020-09-09 NOTE — Patient Instructions (Addendum)
Visit Information  Goals Addressed            This Visit's Progress   . Pharmacy Care Plan       CARE PLAN ENTRY (see longitudinal plan of care for additional care plan information)  Current Barriers:  . Chronic Disease Management support, education, and care coordination needs related to Hyperlipidemia and Heart Failure   Heart Failure BP Readings from Last 3 Encounters:  08/17/20 138/72  07/27/20 124/62  06/30/20 105/68   . Pharmacist Clinical Goal(s): o Over the next 90 days, patient will work with PharmD and providers to manage heart failure, maintain BP goal <140/90 and minimize swelling. . Current regimen:  o Furosemide 20 mg daily and 40 mg on days with weight gain greater than 3 lbs o Ranolazine 500 mg bid  . Interventions: o Recommend patient continue to elevate feet to reduce swelling.  o Recommend patient complete recommended heart monitoring for next steps on PVCs.  . Patient self care activities - Over the next 90 days, patient will: o Continue taking medication daily as prescribed. o Ensure daily salt intake < 2300 mg/day  Hyperlipidemia Lab Results  Component Value Date/Time   LDLCALC 75 07/27/2020 02:29 PM   . Pharmacist Clinical Goal(s): o Over the next 90 days, patient will work with PharmD and providers to maintain LDL goal < 100 . Current regimen:  o Pravastatin 80 mg daily . Interventions: o Recommend continuing to eat heart healthy diet.  . Patient self care activities - Over the next 90 days, patient will: o Continue to take medication as prescribed.   Anxiety . Pharmacist Clinical Goal(s) o Over the next 90 days, patient will work with PharmD and providers to manage anxiety . Current regimen:  o Alprazolam 0.5 mg bid  . Interventions: o Recommend patient discuss with Dr. Tobie Poet about alternative medications to help manage anxiety at next visit.  . Patient self care activities - Over the next 90 days, patient will: o Continue taking  medication as prescribed and contact pharmacist or provider with any questions.     Medication management . Pharmacist Clinical Goal(s): o Over the next 90 days, patient will work with PharmD and providers to maintain optimal medication adherence . Current pharmacy: [facility] . Interventions o Comprehensive medication review performed. o Continue current medication management strategy . Patient self care activities - Over the next 90 days, patient will: o Focus on medication adherence by continuing to use chart.  o Take medications as prescribed o Report any questions or concerns to PharmD and/or provider(s)  Initial goal documentation        The patient verbalized understanding of instructions, educational materials, and care plan provided today and declined offer to receive copy of patient instructions, educational materials, and care plan.   Telephone follow up appointment with pharmacy team member scheduled for: 11/2020  Burnice Logan, [facility]  Managing Anxiety, Adult After being diagnosed with an anxiety disorder, you may be relieved to know why you have felt or behaved a certain way. You may also feel overwhelmed about the treatment ahead and what it will mean for your life. With care and support, you can manage this condition and recover from it. How to manage lifestyle changes Managing stress and anxiety  Stress is your body's reaction to life changes and events, both good and bad. Most stress will last just a few hours, but stress can be ongoing and can lead to more than just stress. Although stress  can play a major role in anxiety, it is not the same as anxiety. Stress is usually caused by something external, such as a deadline, test, or competition. Stress normally passes after the triggering event has ended.  Anxiety is caused by something internal, such as imagining a terrible outcome or worrying that something will go wrong that will devastate you. Anxiety often  does not go away even after the triggering event is over, and it can become long-term (chronic) worry. It is important to understand the differences between stress and anxiety and to manage your stress effectively so that it does not lead to an anxious response. Talk with your health care provider or a counselor to learn more about reducing anxiety and stress. He or she may suggest tension reduction techniques, such as:  Music therapy. This can include creating or listening to music that you enjoy and that inspires you.  Mindfulness-based meditation. This involves being aware of your normal breaths while not trying to control your breathing. It can be done while sitting or walking.  Centering prayer. This involves focusing on a word, phrase, or sacred image that means something to you and brings you peace.  Deep breathing. To do this, expand your stomach and inhale slowly through your nose. Hold your breath for 3-5 seconds. Then exhale slowly, letting your stomach muscles relax.  Self-talk. This involves identifying thought patterns that lead to anxiety reactions and changing those patterns.  Muscle relaxation. This involves tensing muscles and then relaxing them. Choose a tension reduction technique that suits your lifestyle and personality. These techniques take time and practice. Set aside 5-15 minutes a day to do them. Therapists can offer counseling and training in these techniques. The training to help with anxiety may be covered by some insurance plans. Other things you can do to manage stress and anxiety include:  Keeping a stress/anxiety diary. This can help you learn what triggers your reaction and then learn ways to manage your response.  Thinking about how you react to certain situations. You may not be able to control everything, but you can control your response.  Making time for activities that help you relax and not feeling guilty about spending your time in this way.  Visual  imagery and yoga can help you stay calm and relax.  Medicines Medicines can help ease symptoms. Medicines for anxiety include:  Anti-anxiety drugs.  Antidepressants. Medicines are often used as a primary treatment for anxiety disorder. Medicines will be prescribed by a health care provider. When used together, medicines, psychotherapy, and tension reduction techniques may be the most effective treatment. Relationships Relationships can play a big part in helping you recover. Try to spend more time connecting with trusted friends and family members. Consider going to couples counseling, taking family education classes, or going to family therapy. Therapy can help you and others better understand your condition. How to recognize changes in your anxiety Everyone responds differently to treatment for anxiety. Recovery from anxiety happens when symptoms decrease and stop interfering with your daily activities at home or work. This may mean that you will start to:  Have better concentration and focus. Worry will interfere less in your daily thinking.  Sleep better.  Be less irritable.  Have more energy.  Have improved memory. It is important to recognize when your condition is getting worse. Contact your health care provider if your symptoms interfere with home or work and you feel like your condition is not improving. Follow these instructions at home:  Activity  Exercise. Most adults should do the following: ? Exercise for at least 150 minutes each week. The exercise should increase your heart rate and make you sweat (moderate-intensity exercise). ? Strengthening exercises at least twice a week.  Get the right amount and quality of sleep. Most adults need 7-9 hours of sleep each night. Lifestyle   Eat a healthy diet that includes plenty of vegetables, fruits, whole grains, low-fat dairy products, and lean protein. Do not eat a lot of foods that are high in solid fats, added sugars, or  salt.  Make choices that simplify your life.  Do not use any products that contain nicotine or tobacco, such as cigarettes, e-cigarettes, and chewing tobacco. If you need help quitting, ask your health care provider.  Avoid caffeine, alcohol, and certain over-the-counter cold medicines. These may make you feel worse. Ask your pharmacist which medicines to avoid. General instructions  Take over-the-counter and prescription medicines only as told by your health care provider.  Keep all follow-up visits as told by your health care provider. This is important. Where to find support You can get help and support from these sources:  Self-help groups.  Online and [employer].  A trusted spiritual leader.  Couples counseling.  Family education classes.  Family therapy. Where to find more information You may find that joining a support group helps you deal with your anxiety. The following sources can help you locate counselors or support groups near you:  [place]: www.mentalhealth[place].net  Anxiety and Depression Association of [place] (ADAA): https://www.clark.net/  National Alliance on Mental Illness (NAMI): www.nami.org Contact a health care provider if you:  Have a hard time staying focused or finishing daily tasks.  Spend many hours a day feeling worried about everyday life.  Become exhausted by worry.  Start to have headaches, feel tense, or have nausea.  Urinate more than normal.  Have diarrhea. Get help right away if you have:  A racing heart and shortness of breath.  Thoughts of hurting yourself or others. If you ever feel like you may hurt yourself or others, or have thoughts about taking your own life, get help right away. You can go to your nearest emergency department or call:  Your local emergency services (911 in the [place]  A suicide crisis helpline, such as the [place] at (520) 219-7666. This is open 24  hours a day. Summary  Taking steps to learn and use tension reduction techniques can help calm you and help prevent triggering an anxiety reaction.  When used together, medicines, psychotherapy, and tension reduction techniques may be the most effective treatment.  Family, friends, and partners can play a big part in helping you recover from an anxiety disorder. This information is not intended to replace advice given to you by your health care provider. Make sure you discuss any questions you have with your health care provider. Document Revised: 02/05/2019 Document Reviewed: 02/05/2019 Elsevier Patient Education  [place].

## 2020-09-19 DEATH — deceased

## 2020-11-04 ENCOUNTER — Ambulatory Visit: Payer: Medicare Other | Admitting: Family Medicine

## 2020-11-14 ENCOUNTER — Other Ambulatory Visit: Payer: Self-pay | Admitting: Family Medicine

## 2020-11-30 ENCOUNTER — Telehealth: Payer: Medicare Other

## 2020-12-03 ENCOUNTER — Ambulatory Visit: Payer: Medicare Other | Admitting: Cardiology

## 2020-12-07 ENCOUNTER — Ambulatory Visit: Payer: Medicare Other | Admitting: Cardiology
